# Patient Record
Sex: Female | Born: 1945
Health system: Southern US, Community
[De-identification: ages and names within clinical notes are randomized; demographics above are authoritative.]

## PROBLEM LIST (undated history)

## (undated) DIAGNOSIS — G2581 Restless legs syndrome: Secondary | ICD-10-CM

## (undated) DIAGNOSIS — N809 Endometriosis, unspecified: Secondary | ICD-10-CM

## (undated) DIAGNOSIS — E119 Type 2 diabetes mellitus without complications: Secondary | ICD-10-CM

## (undated) DIAGNOSIS — I340 Nonrheumatic mitral (valve) insufficiency: Secondary | ICD-10-CM

## (undated) DIAGNOSIS — Z9289 Personal history of other medical treatment: Secondary | ICD-10-CM

## (undated) DIAGNOSIS — R7303 Prediabetes: Secondary | ICD-10-CM

## (undated) DIAGNOSIS — I471 Supraventricular tachycardia, unspecified: Secondary | ICD-10-CM

## (undated) DIAGNOSIS — I5189 Other ill-defined heart diseases: Secondary | ICD-10-CM

## (undated) DIAGNOSIS — I5032 Chronic diastolic (congestive) heart failure: Secondary | ICD-10-CM

## (undated) DIAGNOSIS — I2510 Atherosclerotic heart disease of native coronary artery without angina pectoris: Secondary | ICD-10-CM

## (undated) DIAGNOSIS — D649 Anemia, unspecified: Secondary | ICD-10-CM

## (undated) DIAGNOSIS — M545 Low back pain, unspecified: Secondary | ICD-10-CM

## (undated) DIAGNOSIS — K219 Gastro-esophageal reflux disease without esophagitis: Secondary | ICD-10-CM

## (undated) DIAGNOSIS — I4719 Other supraventricular tachycardia: Secondary | ICD-10-CM

## (undated) DIAGNOSIS — R079 Chest pain, unspecified: Secondary | ICD-10-CM

## (undated) DIAGNOSIS — M1990 Unspecified osteoarthritis, unspecified site: Secondary | ICD-10-CM

## (undated) DIAGNOSIS — I200 Unstable angina: Secondary | ICD-10-CM

## (undated) DIAGNOSIS — M5030 Other cervical disc degeneration, unspecified cervical region: Secondary | ICD-10-CM

## (undated) DIAGNOSIS — G8929 Other chronic pain: Secondary | ICD-10-CM

## (undated) DIAGNOSIS — E785 Hyperlipidemia, unspecified: Secondary | ICD-10-CM

## (undated) DIAGNOSIS — I071 Rheumatic tricuspid insufficiency: Secondary | ICD-10-CM

## (undated) DIAGNOSIS — I10 Essential (primary) hypertension: Secondary | ICD-10-CM

## (undated) DIAGNOSIS — I509 Heart failure, unspecified: Secondary | ICD-10-CM

## (undated) DIAGNOSIS — C539 Malignant neoplasm of cervix uteri, unspecified: Secondary | ICD-10-CM

## (undated) DIAGNOSIS — N329 Bladder disorder, unspecified: Secondary | ICD-10-CM

## (undated) DIAGNOSIS — T7840XA Allergy, unspecified, initial encounter: Secondary | ICD-10-CM

## (undated) DIAGNOSIS — Z636 Dependent relative needing care at home: Secondary | ICD-10-CM

## (undated) DIAGNOSIS — R32 Unspecified urinary incontinence: Secondary | ICD-10-CM

## (undated) DIAGNOSIS — C801 Malignant (primary) neoplasm, unspecified: Secondary | ICD-10-CM

## (undated) HISTORY — DX: Supraventricular tachycardia, unspecified: I47.10

## (undated) HISTORY — DX: Chronic diastolic (congestive) heart failure: I50.32

## (undated) HISTORY — PX: CARDIAC CATHETERIZATION: SHX172

## (undated) HISTORY — DX: Unspecified osteoarthritis, unspecified site: M19.90

## (undated) HISTORY — DX: Other ill-defined heart diseases: I51.89

## (undated) HISTORY — DX: Nonrheumatic mitral (valve) insufficiency: I34.0

## (undated) HISTORY — DX: Atherosclerotic heart disease of native coronary artery without angina pectoris: I25.10

## (undated) HISTORY — DX: Chest pain, unspecified: R07.9

## (undated) HISTORY — DX: Endometriosis, unspecified: N80.9

## (undated) HISTORY — DX: Anemia, unspecified: D64.9

## (undated) HISTORY — DX: Heart failure, unspecified: I50.9

## (undated) HISTORY — PX: OOPHORECTOMY: SHX86

## (undated) HISTORY — DX: Allergy, unspecified, initial encounter: T78.40XA

---

## 1974-06-23 HISTORY — PX: ABDOMINAL HYSTERECTOMY: SHX81

## 2005-10-10 ENCOUNTER — Ambulatory Visit: Payer: Self-pay | Admitting: Family Medicine

## 2007-11-17 ENCOUNTER — Ambulatory Visit: Payer: Self-pay | Admitting: Family Medicine

## 2009-12-12 ENCOUNTER — Ambulatory Visit: Payer: Self-pay | Admitting: Family Medicine

## 2010-06-25 ENCOUNTER — Inpatient Hospital Stay: Payer: Self-pay | Admitting: Internal Medicine

## 2010-07-02 ENCOUNTER — Ambulatory Visit: Payer: Self-pay | Admitting: Internal Medicine

## 2010-07-24 ENCOUNTER — Ambulatory Visit: Payer: Self-pay | Admitting: Internal Medicine

## 2010-08-22 ENCOUNTER — Ambulatory Visit: Payer: Self-pay | Admitting: Internal Medicine

## 2011-03-12 ENCOUNTER — Ambulatory Visit: Payer: Self-pay | Admitting: Family Medicine

## 2011-03-24 ENCOUNTER — Ambulatory Visit: Payer: Self-pay | Admitting: Family Medicine

## 2011-07-30 DIAGNOSIS — I10 Essential (primary) hypertension: Secondary | ICD-10-CM | POA: Diagnosis not present

## 2011-07-30 DIAGNOSIS — K219 Gastro-esophageal reflux disease without esophagitis: Secondary | ICD-10-CM | POA: Diagnosis not present

## 2011-07-30 DIAGNOSIS — G2581 Restless legs syndrome: Secondary | ICD-10-CM | POA: Diagnosis not present

## 2011-07-30 DIAGNOSIS — R32 Unspecified urinary incontinence: Secondary | ICD-10-CM | POA: Diagnosis not present

## 2011-08-05 DIAGNOSIS — G2581 Restless legs syndrome: Secondary | ICD-10-CM | POA: Diagnosis not present

## 2011-08-05 DIAGNOSIS — R7309 Other abnormal glucose: Secondary | ICD-10-CM | POA: Diagnosis not present

## 2011-08-05 DIAGNOSIS — I10 Essential (primary) hypertension: Secondary | ICD-10-CM | POA: Diagnosis not present

## 2011-08-05 DIAGNOSIS — D509 Iron deficiency anemia, unspecified: Secondary | ICD-10-CM | POA: Diagnosis not present

## 2011-11-19 ENCOUNTER — Ambulatory Visit: Payer: Self-pay | Admitting: Family Medicine

## 2011-11-19 DIAGNOSIS — R928 Other abnormal and inconclusive findings on diagnostic imaging of breast: Secondary | ICD-10-CM | POA: Diagnosis not present

## 2012-01-29 DIAGNOSIS — J069 Acute upper respiratory infection, unspecified: Secondary | ICD-10-CM | POA: Diagnosis not present

## 2012-02-27 DIAGNOSIS — R32 Unspecified urinary incontinence: Secondary | ICD-10-CM | POA: Diagnosis not present

## 2012-02-27 DIAGNOSIS — K219 Gastro-esophageal reflux disease without esophagitis: Secondary | ICD-10-CM | POA: Diagnosis not present

## 2012-02-27 DIAGNOSIS — E785 Hyperlipidemia, unspecified: Secondary | ICD-10-CM | POA: Diagnosis not present

## 2012-02-27 DIAGNOSIS — I10 Essential (primary) hypertension: Secondary | ICD-10-CM | POA: Diagnosis not present

## 2012-04-08 DIAGNOSIS — N3946 Mixed incontinence: Secondary | ICD-10-CM | POA: Diagnosis not present

## 2012-04-08 DIAGNOSIS — N952 Postmenopausal atrophic vaginitis: Secondary | ICD-10-CM | POA: Diagnosis not present

## 2012-04-08 DIAGNOSIS — N816 Rectocele: Secondary | ICD-10-CM | POA: Diagnosis not present

## 2012-04-16 DIAGNOSIS — R109 Unspecified abdominal pain: Secondary | ICD-10-CM | POA: Diagnosis not present

## 2012-04-16 DIAGNOSIS — R300 Dysuria: Secondary | ICD-10-CM | POA: Diagnosis not present

## 2012-07-12 DIAGNOSIS — Z23 Encounter for immunization: Secondary | ICD-10-CM | POA: Diagnosis not present

## 2012-08-03 DIAGNOSIS — J329 Chronic sinusitis, unspecified: Secondary | ICD-10-CM | POA: Diagnosis not present

## 2012-09-14 DIAGNOSIS — Z23 Encounter for immunization: Secondary | ICD-10-CM | POA: Diagnosis not present

## 2012-09-14 DIAGNOSIS — E785 Hyperlipidemia, unspecified: Secondary | ICD-10-CM | POA: Diagnosis not present

## 2012-09-14 DIAGNOSIS — K219 Gastro-esophageal reflux disease without esophagitis: Secondary | ICD-10-CM | POA: Diagnosis not present

## 2012-09-14 DIAGNOSIS — I10 Essential (primary) hypertension: Secondary | ICD-10-CM | POA: Diagnosis not present

## 2012-09-14 DIAGNOSIS — W1809XA Striking against other object with subsequent fall, initial encounter: Secondary | ICD-10-CM | POA: Diagnosis not present

## 2012-09-14 DIAGNOSIS — Z1159 Encounter for screening for other viral diseases: Secondary | ICD-10-CM | POA: Diagnosis not present

## 2012-09-14 DIAGNOSIS — E8881 Metabolic syndrome: Secondary | ICD-10-CM | POA: Diagnosis not present

## 2012-12-07 DIAGNOSIS — E785 Hyperlipidemia, unspecified: Secondary | ICD-10-CM | POA: Diagnosis not present

## 2012-12-07 DIAGNOSIS — I10 Essential (primary) hypertension: Secondary | ICD-10-CM | POA: Diagnosis not present

## 2012-12-07 DIAGNOSIS — G2581 Restless legs syndrome: Secondary | ICD-10-CM | POA: Diagnosis not present

## 2012-12-07 DIAGNOSIS — R32 Unspecified urinary incontinence: Secondary | ICD-10-CM | POA: Diagnosis not present

## 2013-01-11 ENCOUNTER — Ambulatory Visit: Payer: Self-pay | Admitting: Family Medicine

## 2013-01-11 DIAGNOSIS — R922 Inconclusive mammogram: Secondary | ICD-10-CM | POA: Diagnosis not present

## 2013-01-11 DIAGNOSIS — Z1231 Encounter for screening mammogram for malignant neoplasm of breast: Secondary | ICD-10-CM | POA: Diagnosis not present

## 2013-01-28 DIAGNOSIS — N3090 Cystitis, unspecified without hematuria: Secondary | ICD-10-CM | POA: Diagnosis not present

## 2013-02-10 ENCOUNTER — Ambulatory Visit: Payer: Self-pay | Admitting: Gastroenterology

## 2013-02-10 DIAGNOSIS — D649 Anemia, unspecified: Secondary | ICD-10-CM | POA: Diagnosis not present

## 2013-02-10 DIAGNOSIS — Z1211 Encounter for screening for malignant neoplasm of colon: Secondary | ICD-10-CM | POA: Diagnosis not present

## 2013-02-10 DIAGNOSIS — K219 Gastro-esophageal reflux disease without esophagitis: Secondary | ICD-10-CM | POA: Diagnosis not present

## 2013-02-10 DIAGNOSIS — I10 Essential (primary) hypertension: Secondary | ICD-10-CM | POA: Diagnosis not present

## 2013-02-10 DIAGNOSIS — Z79899 Other long term (current) drug therapy: Secondary | ICD-10-CM | POA: Diagnosis not present

## 2013-02-10 DIAGNOSIS — Z7982 Long term (current) use of aspirin: Secondary | ICD-10-CM | POA: Diagnosis not present

## 2013-02-10 DIAGNOSIS — E119 Type 2 diabetes mellitus without complications: Secondary | ICD-10-CM | POA: Diagnosis not present

## 2013-02-10 DIAGNOSIS — R011 Cardiac murmur, unspecified: Secondary | ICD-10-CM | POA: Diagnosis not present

## 2013-02-10 DIAGNOSIS — K5730 Diverticulosis of large intestine without perforation or abscess without bleeding: Secondary | ICD-10-CM | POA: Diagnosis not present

## 2013-03-15 DIAGNOSIS — E785 Hyperlipidemia, unspecified: Secondary | ICD-10-CM | POA: Diagnosis not present

## 2013-03-15 DIAGNOSIS — Z23 Encounter for immunization: Secondary | ICD-10-CM | POA: Diagnosis not present

## 2013-03-15 DIAGNOSIS — M25529 Pain in unspecified elbow: Secondary | ICD-10-CM | POA: Diagnosis not present

## 2013-03-15 DIAGNOSIS — I10 Essential (primary) hypertension: Secondary | ICD-10-CM | POA: Diagnosis not present

## 2013-03-15 DIAGNOSIS — E8881 Metabolic syndrome: Secondary | ICD-10-CM | POA: Diagnosis not present

## 2013-03-30 DIAGNOSIS — M7710 Lateral epicondylitis, unspecified elbow: Secondary | ICD-10-CM | POA: Diagnosis not present

## 2013-06-28 DIAGNOSIS — R1110 Vomiting, unspecified: Secondary | ICD-10-CM | POA: Diagnosis not present

## 2013-06-28 DIAGNOSIS — E8881 Metabolic syndrome: Secondary | ICD-10-CM | POA: Diagnosis not present

## 2013-06-28 DIAGNOSIS — K219 Gastro-esophageal reflux disease without esophagitis: Secondary | ICD-10-CM | POA: Diagnosis not present

## 2013-06-28 DIAGNOSIS — I10 Essential (primary) hypertension: Secondary | ICD-10-CM | POA: Diagnosis not present

## 2013-06-28 DIAGNOSIS — E785 Hyperlipidemia, unspecified: Secondary | ICD-10-CM | POA: Diagnosis not present

## 2013-06-28 DIAGNOSIS — R5381 Other malaise: Secondary | ICD-10-CM | POA: Diagnosis not present

## 2013-06-28 DIAGNOSIS — R32 Unspecified urinary incontinence: Secondary | ICD-10-CM | POA: Diagnosis not present

## 2013-06-28 DIAGNOSIS — M25529 Pain in unspecified elbow: Secondary | ICD-10-CM | POA: Diagnosis not present

## 2013-10-06 DIAGNOSIS — E785 Hyperlipidemia, unspecified: Secondary | ICD-10-CM | POA: Diagnosis not present

## 2013-10-06 DIAGNOSIS — G2581 Restless legs syndrome: Secondary | ICD-10-CM | POA: Diagnosis not present

## 2013-10-06 DIAGNOSIS — E8881 Metabolic syndrome: Secondary | ICD-10-CM | POA: Diagnosis not present

## 2013-10-06 DIAGNOSIS — I10 Essential (primary) hypertension: Secondary | ICD-10-CM | POA: Diagnosis not present

## 2014-02-07 DIAGNOSIS — E8881 Metabolic syndrome: Secondary | ICD-10-CM | POA: Diagnosis not present

## 2014-02-07 DIAGNOSIS — I10 Essential (primary) hypertension: Secondary | ICD-10-CM | POA: Diagnosis not present

## 2014-02-07 DIAGNOSIS — L723 Sebaceous cyst: Secondary | ICD-10-CM | POA: Diagnosis not present

## 2014-02-07 DIAGNOSIS — E785 Hyperlipidemia, unspecified: Secondary | ICD-10-CM | POA: Diagnosis not present

## 2014-03-06 DIAGNOSIS — J40 Bronchitis, not specified as acute or chronic: Secondary | ICD-10-CM | POA: Diagnosis not present

## 2014-06-19 DIAGNOSIS — K219 Gastro-esophageal reflux disease without esophagitis: Secondary | ICD-10-CM | POA: Diagnosis not present

## 2014-06-19 DIAGNOSIS — E785 Hyperlipidemia, unspecified: Secondary | ICD-10-CM | POA: Diagnosis not present

## 2014-06-19 DIAGNOSIS — G2581 Restless legs syndrome: Secondary | ICD-10-CM | POA: Diagnosis not present

## 2014-06-19 DIAGNOSIS — N952 Postmenopausal atrophic vaginitis: Secondary | ICD-10-CM | POA: Diagnosis not present

## 2014-06-19 DIAGNOSIS — Z23 Encounter for immunization: Secondary | ICD-10-CM | POA: Diagnosis not present

## 2014-06-19 DIAGNOSIS — E8881 Metabolic syndrome: Secondary | ICD-10-CM | POA: Diagnosis not present

## 2014-06-19 DIAGNOSIS — R32 Unspecified urinary incontinence: Secondary | ICD-10-CM | POA: Diagnosis not present

## 2014-06-19 DIAGNOSIS — I10 Essential (primary) hypertension: Secondary | ICD-10-CM | POA: Diagnosis not present

## 2014-06-19 DIAGNOSIS — E161 Other hypoglycemia: Secondary | ICD-10-CM | POA: Diagnosis not present

## 2014-06-19 LAB — HEMOGLOBIN A1C: HEMOGLOBIN A1C: 5.9 % (ref 4.0–6.0)

## 2014-06-20 DIAGNOSIS — I10 Essential (primary) hypertension: Secondary | ICD-10-CM | POA: Diagnosis not present

## 2014-06-20 DIAGNOSIS — E785 Hyperlipidemia, unspecified: Secondary | ICD-10-CM | POA: Diagnosis not present

## 2014-06-20 LAB — BASIC METABOLIC PANEL
BUN: 19 mg/dL (ref 4–21)
CREATININE: 1.2 mg/dL — AB (ref 0.5–1.1)
Glucose: 106 mg/dL
POTASSIUM: 4.6 mmol/L (ref 3.4–5.3)
SODIUM: 139 mmol/L (ref 137–147)

## 2014-06-20 LAB — HEPATIC FUNCTION PANEL
ALK PHOS: 63 U/L (ref 25–125)
ALT: 19 U/L (ref 7–35)
AST: 18 U/L (ref 13–35)
Bilirubin, Total: 0.4 mg/dL

## 2014-06-20 LAB — LIPID PANEL
Cholesterol: 208 mg/dL — AB (ref 0–200)
HDL: 83 mg/dL — AB (ref 35–70)
LDL Cholesterol: 104 mg/dL
LDL/HDL RATIO: 1.3
Triglycerides: 104 mg/dL (ref 40–160)

## 2014-07-31 DIAGNOSIS — L820 Inflamed seborrheic keratosis: Secondary | ICD-10-CM | POA: Diagnosis not present

## 2014-07-31 DIAGNOSIS — L720 Epidermal cyst: Secondary | ICD-10-CM | POA: Diagnosis not present

## 2014-08-22 DIAGNOSIS — E2839 Other primary ovarian failure: Secondary | ICD-10-CM | POA: Diagnosis not present

## 2014-08-22 DIAGNOSIS — Z1239 Encounter for other screening for malignant neoplasm of breast: Secondary | ICD-10-CM | POA: Diagnosis not present

## 2014-08-22 DIAGNOSIS — Z9181 History of falling: Secondary | ICD-10-CM | POA: Diagnosis not present

## 2014-08-22 DIAGNOSIS — Z719 Counseling, unspecified: Secondary | ICD-10-CM | POA: Diagnosis not present

## 2014-08-22 DIAGNOSIS — J40 Bronchitis, not specified as acute or chronic: Secondary | ICD-10-CM | POA: Diagnosis not present

## 2014-08-22 DIAGNOSIS — Z1389 Encounter for screening for other disorder: Secondary | ICD-10-CM | POA: Diagnosis not present

## 2014-08-22 DIAGNOSIS — Z0000 Encounter for general adult medical examination without abnormal findings: Secondary | ICD-10-CM | POA: Diagnosis not present

## 2014-08-22 DIAGNOSIS — Z124 Encounter for screening for malignant neoplasm of cervix: Secondary | ICD-10-CM | POA: Diagnosis not present

## 2014-08-22 DIAGNOSIS — Z1211 Encounter for screening for malignant neoplasm of colon: Secondary | ICD-10-CM | POA: Diagnosis not present

## 2014-08-22 DIAGNOSIS — Z1151 Encounter for screening for human papillomavirus (HPV): Secondary | ICD-10-CM | POA: Diagnosis not present

## 2014-08-22 LAB — HM PAP SMEAR

## 2014-09-04 ENCOUNTER — Ambulatory Visit: Payer: Self-pay | Admitting: Family Medicine

## 2014-09-04 DIAGNOSIS — J40 Bronchitis, not specified as acute or chronic: Secondary | ICD-10-CM | POA: Diagnosis not present

## 2014-09-04 DIAGNOSIS — R300 Dysuria: Secondary | ICD-10-CM | POA: Diagnosis not present

## 2014-09-04 DIAGNOSIS — R05 Cough: Secondary | ICD-10-CM | POA: Diagnosis not present

## 2014-09-12 DIAGNOSIS — L720 Epidermal cyst: Secondary | ICD-10-CM | POA: Diagnosis not present

## 2014-09-12 DIAGNOSIS — D485 Neoplasm of uncertain behavior of skin: Secondary | ICD-10-CM | POA: Diagnosis not present

## 2014-09-26 ENCOUNTER — Ambulatory Visit
Admit: 2014-09-26 | Disposition: A | Discharge status: Home / Self Care | Payer: Self-pay | Attending: Family Medicine | Admitting: Family Medicine

## 2014-09-26 DIAGNOSIS — E2839 Other primary ovarian failure: Secondary | ICD-10-CM | POA: Diagnosis not present

## 2014-09-26 DIAGNOSIS — Z1231 Encounter for screening mammogram for malignant neoplasm of breast: Secondary | ICD-10-CM | POA: Diagnosis not present

## 2014-09-26 DIAGNOSIS — M8589 Other specified disorders of bone density and structure, multiple sites: Secondary | ICD-10-CM | POA: Diagnosis not present

## 2014-09-26 DIAGNOSIS — M8580 Other specified disorders of bone density and structure, unspecified site: Secondary | ICD-10-CM | POA: Diagnosis not present

## 2014-11-20 ENCOUNTER — Ambulatory Visit
Admission: EM | Admit: 2014-11-20 | Discharge: 2014-11-20 | Disposition: A | Discharge status: Home / Self Care | Payer: Medicare Other | Attending: Family Medicine | Admitting: Family Medicine

## 2014-11-20 DIAGNOSIS — J301 Allergic rhinitis due to pollen: Secondary | ICD-10-CM

## 2014-11-20 DIAGNOSIS — H6593 Unspecified nonsuppurative otitis media, bilateral: Secondary | ICD-10-CM

## 2014-11-20 DIAGNOSIS — J0100 Acute maxillary sinusitis, unspecified: Secondary | ICD-10-CM | POA: Diagnosis not present

## 2014-11-20 HISTORY — DX: Bladder disorder, unspecified: N32.9

## 2014-11-20 HISTORY — DX: Gastro-esophageal reflux disease without esophagitis: K21.9

## 2014-11-20 HISTORY — DX: Essential (primary) hypertension: I10

## 2014-11-20 MED ORDER — ACETAMINOPHEN 500 MG PO TABS: 500.0000 mg | ORAL_TABLET | Freq: Four times a day (QID) | ORAL | Status: AC | PRN

## 2014-11-20 MED ORDER — SALINE SPRAY 0.65 % NA SOLN: 2.0000 | NASAL | Status: DC

## 2014-11-20 MED ORDER — FLUTICASONE PROPIONATE 50 MCG/ACT NA SUSP: 1.0000 | Freq: Two times a day (BID) | NASAL | Status: DC

## 2014-11-20 MED ORDER — KETOROLAC TROMETHAMINE 60 MG/2ML IM SOLN
30.0000 mg | Freq: Once | INTRAMUSCULAR | Status: AC
  Administered 2014-11-20: 30 mg via INTRAMUSCULAR

## 2014-11-20 NOTE — Discharge Instructions (Signed)
Otitis Media With Effusion Otitis media with effusion is the presence of fluid in the middle ear. This is a common problem in children, which often follows ear infections. It may be present for weeks or longer after the infection. Unlike an acute ear infection, otitis media with effusion refers only to fluid behind the ear drum and not infection. Children with repeated ear and sinus infections and allergy problems are the most likely to get otitis media with effusion. CAUSES  The most frequent cause of the fluid buildup is dysfunction of the eustachian tubes. These are the tubes that drain fluid in the ears to the back of the nose (nasopharynx). SYMPTOMS   The main symptom of this condition is hearing loss. As a result, you or your child may:  Listen to the TV at a loud volume.  Not respond to questions.  Ask "what" often when spoken to.  Mistake or confuse one sound or word for another.  There may be a sensation of fullness or pressure but usually not pain. DIAGNOSIS   Your health care provider will diagnose this condition by examining you or your child's ears.  Your health care provider may test the pressure in you or your child's ear with a tympanometer.  A hearing test may be conducted if the problem persists. TREATMENT   Treatment depends on the duration and the effects of the effusion.  Antibiotics, decongestants, nose drops, and cortisone-type drugs (tablets or nasal spray) may not be helpful.  Children with persistent ear effusions may have delayed language or behavioral problems. Children at risk for developmental delays in hearing, learning, and speech may require referral to a specialist earlier than children not at risk.  You or your child's health care provider may suggest a referral to an ear, nose, and throat surgeon for treatment. The following may help restore normal hearing:  Drainage of fluid.  Placement of ear tubes (tympanostomy tubes).  Removal of adenoids  (adenoidectomy). HOME CARE INSTRUCTIONS   Avoid secondhand smoke.  Infants who are breastfed are less likely to have this condition.  Avoid feeding infants while they are lying flat.  Avoid known environmental allergens.  Avoid people who are sick. SEEK MEDICAL CARE IF:   Hearing is not better in 3 months.  Hearing is worse.  Ear pain.  Drainage from the ear.  Dizziness. MAKE SURE YOU:   Understand these instructions.  Will watch your condition.  Will get help right away if you are not doing well or get worse. Document Released: 07/17/2004 Document Revised: 10/24/2013 Document Reviewed: 01/04/2013 [facility] Patient Information 2015 [place], [place]. This information is not intended to replace advice given to you by your health care provider. Make sure you discuss any questions you have with your health care provider. Sinusitis Sinusitis is redness, soreness, and inflammation of the paranasal sinuses. Paranasal sinuses are air pockets within the bones of your face (beneath the eyes, the middle of the forehead, or above the eyes). In healthy paranasal sinuses, mucus is able to drain out, and air is able to circulate through them by way of your nose. However, when your paranasal sinuses are inflamed, mucus and air can become trapped. This can allow bacteria and other germs to grow and cause infection. Sinusitis can develop quickly and last only a short time (acute) or continue over a long period (chronic). Sinusitis that lasts for more than 12 weeks is considered chronic.  CAUSES  Causes of sinusitis include:  Allergies.  Structural abnormalities, such as  displacement of the cartilage that separates your nostrils (deviated septum), which can decrease the air flow through your nose and sinuses and affect sinus drainage.  Functional abnormalities, such as when the small hairs (cilia) that line your sinuses and help remove mucus do not work properly or are not present. SIGNS AND  SYMPTOMS  Symptoms of acute and chronic sinusitis are the same. The primary symptoms are pain and pressure around the affected sinuses. Other symptoms include:  Upper toothache.  Earache.  Headache.  Bad breath.  Decreased sense of smell and taste.  A cough, which worsens when you are lying flat.  Fatigue.  Fever.  Thick drainage from your nose, which often is green and may contain pus (purulent).  Swelling and warmth over the affected sinuses. DIAGNOSIS  Your health care provider will perform a physical exam. During the exam, your health care provider may:  Look in your nose for signs of abnormal growths in your nostrils (nasal polyps).  Tap over the affected sinus to check for signs of infection.  View the inside of your sinuses (endoscopy) using an imaging device that has a light attached (endoscope). If your health care provider suspects that you have chronic sinusitis, one or more of the following tests may be recommended:  Allergy tests.  Nasal culture. A sample of mucus is taken from your nose, sent to a lab, and screened for bacteria.  Nasal cytology. A sample of mucus is taken from your nose and examined by your health care provider to determine if your sinusitis is related to an allergy. TREATMENT  Most cases of acute sinusitis are related to a viral infection and will resolve on their own within 10 days. Sometimes medicines are prescribed to help relieve symptoms (pain medicine, decongestants, nasal steroid sprays, or saline sprays).  However, for sinusitis related to a bacterial infection, your health care provider will prescribe antibiotic medicines. These are medicines that will help kill the bacteria causing the infection.  Rarely, sinusitis is caused by a fungal infection. In theses cases, your health care provider will prescribe antifungal medicine. For some cases of chronic sinusitis, surgery is needed. Generally, these are cases in which sinusitis recurs  more than 3 times per year, despite other treatments. HOME CARE INSTRUCTIONS   Drink plenty of water. Water helps thin the mucus so your sinuses can drain more easily.  Use a humidifier.  Inhale steam 3 to 4 times a day (for example, sit in the bathroom with the shower running).  Apply a warm, moist washcloth to your face 3 to 4 times a day, or as directed by your health care provider.  Use saline nasal sprays to help moisten and clean your sinuses.  Take medicines only as directed by your health care provider.  If you were prescribed either an antibiotic or antifungal medicine, finish it all even if you start to feel better. SEEK IMMEDIATE MEDICAL CARE IF:  You have increasing pain or severe headaches.  You have nausea, vomiting, or drowsiness.  You have swelling around your face.  You have vision problems.  You have a stiff neck.  You have difficulty breathing. MAKE SURE YOU:   Understand these instructions.  Will watch your condition.  Will get help right away if you are not doing well or get worse. Document Released: 06/09/2005 Document Revised: 10/24/2013 Document Reviewed: 06/24/2011 [facility] Patient Information 2015 [place], [place]. This information is not intended to replace advice given to you by your health care provider. Make sure  you discuss any questions you have with your health care provider. Allergic Rhinitis Allergic rhinitis is when the mucous membranes in the nose respond to allergens. Allergens are particles in the air that cause your body to have an allergic reaction. This causes you to release allergic antibodies. Through a chain of events, these eventually cause you to release histamine into the blood stream. Although meant to protect the body, it is this release of histamine that causes your discomfort, such as frequent sneezing, congestion, and an itchy, runny nose.  CAUSES  Seasonal allergic rhinitis (hay fever) is caused by pollen allergens that may  come from grasses, trees, and weeds. Year-round allergic rhinitis (perennial allergic rhinitis) is caused by allergens such as house dust mites, pet dander, and mold spores.  SYMPTOMS   Nasal stuffiness (congestion).  Itchy, runny nose with sneezing and tearing of the eyes. DIAGNOSIS  Your health care provider can help you determine the allergen or allergens that trigger your symptoms. If you and your health care provider are unable to determine the allergen, skin or blood testing may be used. TREATMENT  Allergic rhinitis does not have a cure, but it can be controlled by:  Medicines and allergy shots (immunotherapy).  Avoiding the allergen. Hay fever may often be treated with antihistamines in pill or nasal spray forms. Antihistamines block the effects of histamine. There are over-the-counter medicines that may help with nasal congestion and swelling around the eyes. Check with your health care provider before taking or giving this medicine.  If avoiding the allergen or the medicine prescribed do not work, there are many new medicines your health care provider can prescribe. Stronger medicine may be used if initial measures are ineffective. Desensitizing injections can be used if medicine and avoidance does not work. Desensitization is when a patient is given ongoing shots until the body becomes less sensitive to the allergen. Make sure you follow up with your health care provider if problems continue. HOME CARE INSTRUCTIONS It is not possible to completely avoid allergens, but you can reduce your symptoms by taking steps to limit your exposure to them. It helps to know exactly what you are allergic to so that you can avoid your specific triggers. SEEK MEDICAL CARE IF:   You have a fever.  You develop a cough that does not stop easily (persistent).  You have shortness of breath.  You start wheezing.  Symptoms interfere with normal daily activities. Document Released: 03/04/2001 Document  Revised: 06/14/2013 Document Reviewed: 02/14/2013 [facility] Patient Information 2015 [place], [place]. This information is not intended to replace advice given to you by your health care provider. Make sure you discuss any questions you have with your health care provider.

## 2014-11-20 NOTE — ED Provider Notes (Signed)
CSN: [id number]     Arrival date & time 11/20/14  3614 History   First MD Initiated Contact with Patient 11/20/14 1112     Chief Complaint  Patient presents with  . Headache   (Consider location/radiation/quality/duration/timing/severity/associated sxs/prior Treatment) HPI Comments: Caucasian female has bilateral ear pressure and back of head pressure not improving with OTC ear gtts today, rest.  Has tried everything OTC for her allergies without any relief po and nose sprays.    Patient is a 69 y.o. female presenting with headaches. The history is provided by the patient.  Headache Pain location:  Occipital Quality:  Dull Radiates to:  Does not radiate Severity currently:  4/10 Severity at highest:  4/10 Onset quality:  Gradual Timing:  Constant Progression:  Unchanged Chronicity:  New Similar to prior headaches: no   Context: not activity, not exposure to bright light, not caffeine, not coughing, not defecating, not eating, not stress, not exposure to cold air, not intercourse, not loud noise and not straining   Relieved by:  Nothing Worsened by:  Activity Ineffective treatments: OTC ear drops. Associated symptoms: congestion   Associated symptoms: no abdominal pain, no back pain, no blurred vision, no cough, no diarrhea, no dizziness, no drainage, no ear pain, no eye pain, no facial pain, no fatigue, no fever, no focal weakness, no hearing loss, no loss of balance, no myalgias, no nausea, no near-syncope, no neck pain, no neck stiffness, no numbness, no paresthesias, no photophobia, no seizures, no sinus pressure, no sore throat, no swollen glands, no syncope, no tingling, no URI, no visual change, no vomiting and no weakness   Associated symptoms comment:  Ear pressure bilaterally; occipital head pressure Risk factors: no anger, no family hx of SAH, does not have insomnia and lifestyle not sedentary     Past Medical History  Diagnosis Date  . Hypertension   . GERD  (gastroesophageal reflux disease)   . Bladder disorder    Past Surgical History  Procedure Laterality Date  . Abdominal hysterectomy     Family History  Problem Relation Age of Onset  . Heart failure Father    History  Substance Use Topics  . Smoking status: Former Research scientist (life sciences)  . Smokeless tobacco: Not on file  . Alcohol Use: Yes     Comment: socially   OB History    No data available     Review of Systems  Constitutional: Negative for fever, diaphoresis, activity change and fatigue.  HENT: Positive for congestion and rhinorrhea. Negative for dental problem, drooling, ear discharge, ear pain, facial swelling, hearing loss, mouth sores, nosebleeds, postnasal drip, sinus pressure, sneezing, sore throat, tinnitus, trouble swallowing and voice change.   Eyes: Negative for blurred vision, photophobia, pain, discharge, redness, itching and visual disturbance.  Respiratory: Negative for apnea, cough, choking, chest tightness, shortness of breath, wheezing and stridor.   Cardiovascular: Negative for chest pain, palpitations, leg swelling, syncope and near-syncope.  Gastrointestinal: Negative for nausea, vomiting, abdominal pain, diarrhea, constipation and blood in stool.  Endocrine: Negative for cold intolerance and heat intolerance.  Genitourinary: Negative for dysuria, hematuria and difficulty urinating.  Musculoskeletal: Negative for myalgias, back pain, joint swelling, arthralgias, gait problem, neck pain and neck stiffness.  Skin: Negative for color change, pallor, rash and wound.  Allergic/Immunologic: Positive for environmental allergies. Negative for food allergies.  Neurological: Positive for headaches. Negative for dizziness, tremors, focal weakness, seizures, syncope, facial asymmetry, speech difficulty, weakness, light-headedness, numbness, paresthesias and loss of balance.  Hematological: Negative for  adenopathy. Does not bruise/bleed easily.  Psychiatric/Behavioral: Negative for  behavioral problems, confusion, sleep disturbance and agitation.    Allergies  Review of patient's allergies indicates no known allergies.  Home Medications   Prior to Admission medications   Medication Sig Start Date End Date Taking? Authorizing Provider  ferrous sulfate 325 (65 FE) MG tablet Take 325 mg by mouth daily with breakfast.   Yes Historical Provider, MD  lisinopril (PRINIVIL,ZESTRIL) 20 MG tablet Take 20 mg by mouth daily.   Yes Historical Provider, MD  Multiple Vitamin (MULTIVITAMIN) capsule Take 1 capsule by mouth daily.   Yes Historical Provider, MD  omeprazole (PRILOSEC) 40 MG capsule Take 40 mg by mouth daily.   Yes Historical Provider, MD  oxybutynin (DITROPAN-XL) 5 MG 24 hr tablet Take 5 mg by mouth at bedtime.   Yes Historical Provider, MD  acetaminophen (TYLENOL) 500 MG tablet Take 1 tablet (500 mg total) by mouth every 6 (six) hours as needed for headache. 11/20/14   Olen Cordial, NP  fluticasone (FLONASE) 50 MCG/ACT nasal spray Place 1 spray into both nostrils 2 (two) times daily. 11/20/14   Olen Cordial, NP  sodium chloride (OCEAN) 0.65 % SOLN nasal spray Place 2 sprays into both nostrils every 2 (two) hours while awake. 11/20/14   Aura Fey Jaylynn Siefert, NP   BP 154/79 mmHg  Pulse 72  Temp(Src) 97.9 F (36.6 C) (Tympanic)  Resp 16  Ht 5\' 4"  (1.626 m)  Wt 165 lb (74.844 kg)  BMI 28.31 kg/m2  SpO2 98% Physical Exam  Constitutional: She is oriented to person, place, and time. Vital signs are normal. She appears well-developed and well-nourished. No distress.  HENT:  Head: Normocephalic and atraumatic.  Right Ear: External ear normal. A middle ear effusion is present.  Left Ear: External ear normal. A middle ear effusion is present.  Nose: Mucosal edema, rhinorrhea and sinus tenderness present. No nose lacerations, nasal deformity, septal deviation or nasal septal hematoma. No epistaxis.  No foreign bodies. Right sinus exhibits maxillary sinus tenderness.  Right sinus exhibits no frontal sinus tenderness. Left sinus exhibits maxillary sinus tenderness. Left sinus exhibits no frontal sinus tenderness.  Mouth/Throat: Oropharynx is clear and moist. No oropharyngeal exudate.  Bilateral TMs with air fluid level clear; slight pressure with bilateral maxillary palpation  Eyes: Conjunctivae, EOM and lids are normal. Pupils are equal, round, and reactive to light. Right eye exhibits no discharge. Left eye exhibits no discharge. No scleral icterus.  Neck: Trachea normal and normal range of motion. Neck supple. No JVD present. No tracheal deviation present. No thyromegaly present.  Cardiovascular: Normal rate, regular rhythm, normal heart sounds and intact distal pulses.  Exam reveals no gallop and no friction rub.   No murmur heard. Pulmonary/Chest: Effort normal and breath sounds normal. No respiratory distress. She has no wheezes. She has no rales. She exhibits no tenderness.  Abdominal: Soft. Bowel sounds are normal. She exhibits no shifting dullness, no distension, no pulsatile liver, no fluid wave, no abdominal bruit, no ascites, no pulsatile midline mass and no mass. There is no hepatosplenomegaly. There is no tenderness. There is no rebound, no guarding and no CVA tenderness.  Dull to percussion x 4 quads  Musculoskeletal: Normal range of motion. She exhibits no edema or tenderness.  Lymphadenopathy:    She has no cervical adenopathy.  Neurological: She is alert and oriented to person, place, and time.  Skin: Skin is warm, dry and intact. No rash noted. She is not diaphoretic.  No erythema. No pallor.  Psychiatric: She has a normal mood and affect. Her speech is normal and behavior is normal. Judgment and thought content normal. Cognition and memory are normal.  Nursing note and vitals reviewed.   ED Course  Procedures (including critical care time) Labs Review Labs Reviewed - No data to display  Imaging Review No results found.  Patient  reported headache improved after toradol.  Blood pressure stable patient denied dizzyness/weakness.  Ambulatory gait steady in exam room with RN for discharge teaching.   MDM   1. Allergic rhinitis due to pollen   2. Acute maxillary sinusitis, recurrence not specified   3. Otitis media with effusion, bilateral    No evidence of bacterial infection, non toxic and well hydrated.  I do not see where any further testing or imaging is necessary at this time.   I will suggest supportive care, rest, good hygiene and encourage the patient to take adequate fluids.  The patient is to return to clinic or EMERGENCY ROOM if symptoms worsen or change significantly.  Exitcare handout on sinusitis given to patient.  Patient verbalized agreement and understanding of treatment plan and had no further questions at this time.   P2:  Hand washing and cover cough Patient may use normal saline nasal spray as needed.  Consider antihistamine or nasal steroid use.  Avoid triggers if possible.  Shower prior to bedtime if exposed to triggers.  If allergic dust/dust mites recommend mattress/pillow covers/encasements; washing linens, vacuuming, sweeping, dusting weekly.  Call or return to clinic as needed if these symptoms worsen or fail to improve as anticipated.   Exitcare handout on allergic rhinitis given to patient.  Patient verbalized understanding of instructions, agreed with plan of care and had no further questions at this time.  P2:  Avoidance and hand washing. Supportive treatment.   No evidence of invasive bacterial infection, non toxic and well hydrated.  This is most likely self limiting viral infection.  I do not see where any further testing or imaging is necessary at this time.   I will suggest supportive care, rest, good hygiene and encourage the patient to take adequate fluids.  The patient is to return to clinic or EMERGENCY ROOM if symptoms worsen or change significantly e.g. ear pain, fever, purulent discharge  from ears or bleeding.  Exitcare handout on otitis media with effusion given to patient.  Patient verbalized agreement and understanding of treatment plan.   For acute pain, rest, and intermittent application of heat, analgesics, and PRN po NSAIDS.  Avoid known triggers e.g. sleep deprivation, foods, stress, dehydration.  If headache is the worst headache of entire life and came on like a clap of thunder patient was instructed to go to the Emergency Room.  Call or return to clinic as needed if these symptoms worsen or fail to improve as anticipated.  Exitcare handout on headaches given to patient and patient also instructed to maintain headache log.  Patient verbalized agreement and understanding of treatment plan. P2:  Diet and fitness   Olen Cordial, NP 11/20/14 1523

## 2014-11-20 NOTE — ED Notes (Signed)
States started yesterday with "head pressure". "My ears feel sensitive to noise". Denies blurred vision, no other symptoms

## 2014-11-22 DIAGNOSIS — L820 Inflamed seborrheic keratosis: Secondary | ICD-10-CM | POA: Diagnosis not present

## 2014-11-22 DIAGNOSIS — L821 Other seborrheic keratosis: Secondary | ICD-10-CM | POA: Diagnosis not present

## 2014-11-22 DIAGNOSIS — L578 Other skin changes due to chronic exposure to nonionizing radiation: Secondary | ICD-10-CM | POA: Diagnosis not present

## 2014-12-17 ENCOUNTER — Encounter: Payer: Self-pay | Admitting: Family Medicine

## 2014-12-17 DIAGNOSIS — I10 Essential (primary) hypertension: Secondary | ICD-10-CM | POA: Insufficient documentation

## 2014-12-17 DIAGNOSIS — Z8541 Personal history of malignant neoplasm of cervix uteri: Secondary | ICD-10-CM | POA: Insufficient documentation

## 2014-12-17 DIAGNOSIS — R32 Unspecified urinary incontinence: Secondary | ICD-10-CM | POA: Insufficient documentation

## 2014-12-17 DIAGNOSIS — H269 Unspecified cataract: Secondary | ICD-10-CM | POA: Insufficient documentation

## 2014-12-17 DIAGNOSIS — E785 Hyperlipidemia, unspecified: Secondary | ICD-10-CM | POA: Insufficient documentation

## 2014-12-17 DIAGNOSIS — M8580 Other specified disorders of bone density and structure, unspecified site: Secondary | ICD-10-CM | POA: Insufficient documentation

## 2014-12-17 DIAGNOSIS — Z862 Personal history of diseases of the blood and blood-forming organs and certain disorders involving the immune mechanism: Secondary | ICD-10-CM | POA: Insufficient documentation

## 2014-12-17 DIAGNOSIS — I071 Rheumatic tricuspid insufficiency: Secondary | ICD-10-CM | POA: Insufficient documentation

## 2014-12-17 DIAGNOSIS — K219 Gastro-esophageal reflux disease without esophagitis: Secondary | ICD-10-CM | POA: Insufficient documentation

## 2014-12-17 DIAGNOSIS — E8881 Metabolic syndrome: Secondary | ICD-10-CM | POA: Insufficient documentation

## 2014-12-21 ENCOUNTER — Encounter: Payer: Self-pay | Admitting: Family Medicine

## 2014-12-21 ENCOUNTER — Encounter (INDEPENDENT_AMBULATORY_CARE_PROVIDER_SITE_OTHER): Payer: Self-pay

## 2014-12-21 ENCOUNTER — Ambulatory Visit (INDEPENDENT_AMBULATORY_CARE_PROVIDER_SITE_OTHER): Payer: Medicare Other | Admitting: Family Medicine

## 2014-12-21 VITALS — BP 118/76 | HR 95 | Temp 98.8°F | Resp 16 | Ht 64.0 in | Wt 164.2 lb

## 2014-12-21 DIAGNOSIS — R739 Hyperglycemia, unspecified: Secondary | ICD-10-CM | POA: Diagnosis not present

## 2014-12-21 DIAGNOSIS — Z23 Encounter for immunization: Secondary | ICD-10-CM

## 2014-12-21 DIAGNOSIS — M549 Dorsalgia, unspecified: Secondary | ICD-10-CM | POA: Diagnosis not present

## 2014-12-21 DIAGNOSIS — R32 Unspecified urinary incontinence: Secondary | ICD-10-CM | POA: Diagnosis not present

## 2014-12-21 DIAGNOSIS — G8929 Other chronic pain: Secondary | ICD-10-CM | POA: Diagnosis not present

## 2014-12-21 DIAGNOSIS — Z862 Personal history of diseases of the blood and blood-forming organs and certain disorders involving the immune mechanism: Secondary | ICD-10-CM | POA: Diagnosis not present

## 2014-12-21 DIAGNOSIS — R944 Abnormal results of kidney function studies: Secondary | ICD-10-CM

## 2014-12-21 DIAGNOSIS — E8881 Metabolic syndrome: Secondary | ICD-10-CM

## 2014-12-21 DIAGNOSIS — J309 Allergic rhinitis, unspecified: Secondary | ICD-10-CM | POA: Diagnosis not present

## 2014-12-21 DIAGNOSIS — Z90710 Acquired absence of both cervix and uterus: Secondary | ICD-10-CM | POA: Insufficient documentation

## 2014-12-21 DIAGNOSIS — E785 Hyperlipidemia, unspecified: Secondary | ICD-10-CM

## 2014-12-21 DIAGNOSIS — G2581 Restless legs syndrome: Secondary | ICD-10-CM | POA: Insufficient documentation

## 2014-12-21 DIAGNOSIS — I10 Essential (primary) hypertension: Secondary | ICD-10-CM | POA: Diagnosis not present

## 2014-12-21 DIAGNOSIS — L811 Chloasma: Secondary | ICD-10-CM | POA: Insufficient documentation

## 2014-12-21 MED ORDER — METFORMIN HCL 500 MG PO TABS: 500.0000 mg | ORAL_TABLET | Freq: Two times a day (BID) | ORAL | Status: DC

## 2014-12-21 MED ORDER — TRIAMTERENE-HCTZ 37.5-25 MG PO TABS: 1.0000 | ORAL_TABLET | Freq: Every day | ORAL | Status: DC

## 2014-12-21 MED ORDER — OXYBUTYNIN CHLORIDE 5 MG PO TABS: 5.0000 mg | ORAL_TABLET | Freq: Every day | ORAL | Status: DC

## 2014-12-21 MED ORDER — CARVEDILOL 6.25 MG PO TABS: 6.2500 mg | ORAL_TABLET | Freq: Two times a day (BID) | ORAL | Status: DC

## 2014-12-21 MED ORDER — FLUTICASONE PROPIONATE 50 MCG/ACT NA SUSP: 1.0000 | Freq: Two times a day (BID) | NASAL | Status: DC

## 2014-12-21 MED ORDER — TIZANIDINE HCL 2 MG PO TABS: 2.0000 mg | ORAL_TABLET | Freq: Every day | ORAL | Status: DC

## 2014-12-21 MED ORDER — ROPINIROLE HCL 2 MG PO TABS: 2.0000 mg | ORAL_TABLET | Freq: Every day | ORAL | Status: DC

## 2014-12-21 MED ORDER — LOSARTAN POTASSIUM 50 MG PO TABS: 50.0000 mg | ORAL_TABLET | Freq: Every day | ORAL | Status: DC

## 2014-12-21 NOTE — Progress Notes (Signed)
Name: Alyssa Lawson   MRN: [id number]    DOB: 1946-05-07   Date:12/21/2014       Progress Note  Subjective  Chief Complaint  Chief Complaint  Patient presents with  . Medication Refill    6 month F/U  . Hypertension    Checks at home- 126/70  . Sinusitis    Onset- 1 week, pressure in ears bilateral. stuffiness, nasal drainage,  uses Vick's Vapor Rub and using nasal sprays  . Hyperlipidemia    Muscle cramps    HPI  HTN: taking medications, no side effects and bp is under control.   Hyperlipidemia: last labs done in Feb 2016 and is at goal with Pravastatin. Denies side effects  GERD: she is taking Omeprazole prn only and symptoms have been controlled. Advised to try Ranitidine instead to avoid PPI use.   AR: she went to Urgent Care with sinus infection, she was given fluticasone, still using it, feels congested at night.  Advised loratadine otc to see if it will help with symptoms  Metabolic Syndrome: last MWUX3K in Feb was at goal, no polyphagia, polydipsia or polyuria.  Low GFR: last GFR was low, she has been taking Advil daily for low back pain and finger joint pains. Advised to switch to Tylenol max of 3 grams daily to avoid further drop of GFR  RLS: better with medication, sleeping well with medication, very seldom has symptoms.   Chronic Back Pain: she has daily low back pain, describes a dull pain , 3/10, no radiation. No weakens or tingling.  Patient Active Problem List   Diagnosis Date Noted  . Melasma 12/21/2014  . History of hysterectomy 12/21/2014  . Restless leg syndrome 12/21/2014  . Benign essential HTN 12/17/2014  . Cataract 12/17/2014  . Dyslipidemia 12/17/2014  . Gastric reflux 12/17/2014  . History of cervical cancer 12/17/2014  . H/O iron deficiency anemia 12/17/2014  . Urinary incontinence in female 12/17/2014  . Chronic back pain 12/17/2014  . Dysmetabolic syndrome 44/06/270  . TI (tricuspid incompetence) 12/17/2014  . Osteopenia of the  elderly 12/17/2014    Past Surgical History  Procedure Laterality Date  . Abdominal hysterectomy  1976  . Oophorectomy      Family History  Problem Relation Age of Onset  . Heart failure Father   . Arrhythmia Father   . Heart attack Mother   . Cardiomyopathy Sister   . Arrhythmia Sister   . Congestive Heart Failure Brother     History   Social History  . Marital Status: Married    Spouse Name: N/A  . Number of Children: N/A  . Years of Education: N/A   Occupational History  . Not on file.   Social History Main Topics  . Smoking status: Former Smoker -- 1.00 packs/day for 40 years    Types: Cigarettes    Start date: 06/23/1956    Quit date: 06/23/1996  . Smokeless tobacco: Never Used  . Alcohol Use: 0.0 oz/week    0 Standard drinks or equivalent per week     Comment: socially  . Drug Use: No  . Sexual Activity:    Partners: Male   Other Topics Concern  . Not on file   Social History Narrative     Current outpatient prescriptions:  .  acetaminophen (TYLENOL) 500 MG tablet, Take 1 tablet (500 mg total) by mouth every 6 (six) hours as needed for headache., Disp: 30 tablet, Rfl: 0 .  aspirin 81 MG chewable tablet, Chew  1 tablet by mouth daily., Disp: , Rfl:  .  carvedilol (COREG) 6.25 MG tablet, Take 1 tablet (6.25 mg total) by mouth 2 (two) times daily., Disp: 180 tablet, Rfl: 1 .  ferrous sulfate 325 (65 FE) MG tablet, Take 325 mg by mouth daily with breakfast., Disp: , Rfl:  .  fluticasone (FLONASE) 50 MCG/ACT nasal spray, Place 1 spray into both nostrils 2 (two) times daily., Disp: 48 g, Rfl: 0 .  losartan (COZAAR) 50 MG tablet, Take 1 tablet (50 mg total) by mouth daily., Disp: 90 tablet, Rfl: 1 .  Magnesium Oxide 400 (240 MG) MG TABS, Take 1 tablet by mouth daily., Disp: , Rfl:  .  metFORMIN (GLUCOPHAGE) 500 MG tablet, Take 1 tablet (500 mg total) by mouth 2 (two) times daily., Disp: 90 tablet, Rfl: 1 .  Multiple Vitamin (MULTIVITAMIN) capsule, Take 1  capsule by mouth daily., Disp: , Rfl:  .  omeprazole (PRILOSEC) 40 MG capsule, Take 40 mg by mouth daily., Disp: , Rfl:  .  oxybutynin (DITROPAN) 5 MG tablet, Take 1 tablet (5 mg total) by mouth daily., Disp: 90 tablet, Rfl: 1 .  pravastatin (PRAVACHOL) 40 MG tablet, Take 1 tablet by mouth daily., Disp: , Rfl:  .  rOPINIRole (REQUIP) 2 MG tablet, Take 1 tablet (2 mg total) by mouth at bedtime., Disp: 90 tablet, Rfl: 1 .  triamterene-hydrochlorothiazide (MAXZIDE-25) 37.5-25 MG per tablet, Take 1 tablet by mouth daily., Disp: 90 tablet, Rfl: 1 .  valACYclovir (VALTREX) 500 MG tablet, Take 1 tablet by mouth 2 (two) times daily as needed., Disp: , Rfl:  .  sodium chloride (OCEAN) 0.65 % SOLN nasal spray, Place 2 sprays into both nostrils every 2 (two) hours while awake. (Patient not taking: Reported on 12/21/2014), Disp: , Rfl: 0 .  tiZANidine (ZANAFLEX) 2 MG tablet, Take 1 tablet (2 mg total) by mouth at bedtime., Disp: 90 tablet, Rfl: 1  No Known Allergies   ROS  Constitutional: Negative for fever or weight change.  Respiratory: Negative for cough and shortness of breath.   Cardiovascular: Negative for chest pain or palpitations.  Gastrointestinal: Negative for abdominal pain, no bowel changes.  Musculoskeletal: Negative for gait problem or joint swelling. Hands are stiff and low back pain Skin: Negative for rash.  Neurological: Negative for dizziness or headache.  No other specific complaints in a complete review of systems (except as listed in HPI above).  Objective  Filed Vitals:   12/21/14 0809  BP: 118/76  Pulse: 95  Temp: 98.8 F (37.1 C)  TempSrc: Oral  Resp: 16  Height: 5\' 4"  (1.626 m)  Weight: 164 lb 3.2 oz (74.481 kg)  SpO2: 96%    Body mass index is 28.17 kg/(m^2).  Physical Exam Constitutional: Patient appears well-developed and well-nourished. No distress.  HENT: Head: Normocephalic and atraumatic. Ears: B TMs ok, no erythema or effusion; Nose: Nose boggy  turbinates. Mouth/Throat: Oropharynx is clear and moist. No oropharyngeal exudate.  Eyes: Conjunctivae and EOM are normal. Pupils are equal, round, and reactive to light. No scleral icterus.  Neck: Normal range of motion. Neck supple. No JVD present. No thyromegaly present.  Cardiovascular: Normal rate, regular rhythm , Systolic and diastolic murmur most audible on 2nd left intercostal space - no radiation, 2/6. No BLE edema. Pulmonary/Chest: Effort normal and breath sounds normal. No respiratory distress. Abdominal: Soft. Bowel sounds are normal, no distension. There is no tenderness. no masses Musculoskeletal: Normal range of motion, no joint effusions. No gross deformities  Pain during palpation of lumbar spine Neurological: he is alert and oriented to person, place, and time. No cranial nerve deficit. Coordination, balance, strength, speech and gait are normal.  Skin: Skin is warm and dry. No rash noted. No erythema.  Psychiatric: Patient has a normal mood and affect. behavior is normal. Judgment and thought content normal.  PHQ2/9: Depression screen PHQ 2/9 12/21/2014  Decreased Interest 0  Down, Depressed, Hopeless 0  PHQ - 2 Score 0     Fall Risk: Fall Risk  12/21/2014  Falls in the past year? No    Assessment & Plan  1. Dyslipidemia Continue  medication   2. Benign essential HTN At goal - Comprehensive Metabolic Panel (CMET) - triamterene-hydrochlorothiazide (MAXZIDE-25) 37.5-25 MG per tablet; Take 1 tablet by mouth daily.  Dispense: 90 tablet; Refill: 1 - losartan (COZAAR) 50 MG tablet; Take 1 tablet (50 mg total) by mouth daily.  Dispense: 90 tablet; Refill: 1 - carvedilol (COREG) 6.25 MG tablet; Take 1 tablet (6.25 mg total) by mouth 2 (two) times daily.  Dispense: 180 tablet; Refill: 1  3. H/O iron deficiency anemia Recheck labs - CBC with Differential  4. Dysmetabolic syndrome  - HgB O7M - metFORMIN (GLUCOPHAGE) 500 MG tablet; Take 1 tablet (500 mg total) by mouth  2 (two) times daily.  Dispense: 90 tablet; Refill: 1  5. Allergic rhinitis, unspecified allergic rhinitis type Add loratadine otc to see if help with symptoms - fluticasone (FLONASE) 50 MCG/ACT nasal spray; Place 1 spray into both nostrils 2 (two) times daily.  Dispense: 48 g; Refill: 0  6. Kidney function test abnormal Recheck labs, explained importance of stopping NSAID's, and take Tylenol   7. Urinary incontinence in female  - oxybutynin (DITROPAN) 5 MG tablet; Take 1 tablet (5 mg total) by mouth daily.  Dispense: 90 tablet; Refill: 1  8. Restless leg syndrome  - rOPINIRole (REQUIP) 2 MG tablet; Take 1 tablet (2 mg total) by mouth at bedtime.  Dispense: 90 tablet; Refill: 1  9. Hyperglycemia  - HgB A1c  10. Need for pneumococcal vaccination  - Pneumococcal conjugate vaccine 13-valent  11. Chronic back pain Start Tizanidine 2mg  qhs,  Stop Nsaid's Take Tylenol max of 3 grams daily

## 2014-12-22 LAB — CBC WITH DIFFERENTIAL/PLATELET
Basophils Absolute: 0 10*3/uL (ref 0.0–0.2)
Basos: 1 %
EOS (ABSOLUTE): 0.4 10*3/uL (ref 0.0–0.4)
EOS: 6 %
Hematocrit: 46.2 % (ref 34.0–46.6)
Hemoglobin: 15.8 g/dL (ref 11.1–15.9)
IMMATURE GRANS (ABS): 0 10*3/uL (ref 0.0–0.1)
IMMATURE GRANULOCYTES: 0 %
LYMPHS: 18 %
Lymphocytes Absolute: 1.4 10*3/uL (ref 0.7–3.1)
MCH: 31.1 pg (ref 26.6–33.0)
MCHC: 34.2 g/dL (ref 31.5–35.7)
MCV: 91 fL (ref 79–97)
MONOCYTES: 8 %
Monocytes Absolute: 0.6 10*3/uL (ref 0.1–0.9)
Neutrophils Absolute: 5.1 10*3/uL (ref 1.4–7.0)
Neutrophils: 67 %
Platelets: 274 10*3/uL (ref 150–379)
RBC: 5.08 x10E6/uL (ref 3.77–5.28)
RDW: 12.8 % (ref 12.3–15.4)
WBC: 7.5 10*3/uL (ref 3.4–10.8)

## 2014-12-22 LAB — COMPREHENSIVE METABOLIC PANEL
A/G RATIO: 1.6 (ref 1.1–2.5)
ALBUMIN: 4.7 g/dL (ref 3.6–4.8)
ALT: 21 IU/L (ref 0–32)
AST: 22 IU/L (ref 0–40)
Alkaline Phosphatase: 71 IU/L (ref 39–117)
BILIRUBIN TOTAL: 0.4 mg/dL (ref 0.0–1.2)
BUN / CREAT RATIO: 21 (ref 11–26)
BUN: 19 mg/dL (ref 8–27)
CALCIUM: 10.5 mg/dL — AB (ref 8.7–10.3)
CO2: 26 mmol/L (ref 18–29)
CREATININE: 0.92 mg/dL (ref 0.57–1.00)
Chloride: 95 mmol/L — ABNORMAL LOW (ref 97–108)
GFR, EST AFRICAN AMERICAN: 74 mL/min/{1.73_m2} (ref >59)
GFR, EST NON AFRICAN AMERICAN: 64 mL/min/{1.73_m2} (ref >59)
GLUCOSE: 104 mg/dL — AB (ref 65–99)
Globulin, Total: 3 g/dL (ref 1.5–4.5)
Potassium: 4.5 mmol/L (ref 3.5–5.2)
Sodium: 139 mmol/L (ref 134–144)
TOTAL PROTEIN: 7.7 g/dL (ref 6.0–8.5)

## 2014-12-22 LAB — HEMOGLOBIN A1C
ESTIMATED AVERAGE GLUCOSE: 137 mg/dL
HEMOGLOBIN A1C: 6.4 % — AB (ref 4.8–5.6)

## 2015-03-12 ENCOUNTER — Ambulatory Visit
Admission: EM | Admit: 2015-03-12 | Discharge: 2015-03-12 | Disposition: A | Discharge status: Home / Self Care | Payer: Medicare Other | Attending: Family Medicine | Admitting: Family Medicine

## 2015-03-12 ENCOUNTER — Encounter: Payer: Self-pay | Admitting: Emergency Medicine

## 2015-03-12 DIAGNOSIS — Z79899 Other long term (current) drug therapy: Secondary | ICD-10-CM | POA: Diagnosis not present

## 2015-03-12 DIAGNOSIS — M67441 Ganglion, right hand: Secondary | ICD-10-CM | POA: Diagnosis not present

## 2015-03-12 DIAGNOSIS — Z7982 Long term (current) use of aspirin: Secondary | ICD-10-CM | POA: Diagnosis not present

## 2015-03-12 DIAGNOSIS — K219 Gastro-esophageal reflux disease without esophagitis: Secondary | ICD-10-CM | POA: Insufficient documentation

## 2015-03-12 DIAGNOSIS — N390 Urinary tract infection, site not specified: Secondary | ICD-10-CM | POA: Diagnosis not present

## 2015-03-12 DIAGNOSIS — I10 Essential (primary) hypertension: Secondary | ICD-10-CM | POA: Insufficient documentation

## 2015-03-12 DIAGNOSIS — M7989 Other specified soft tissue disorders: Secondary | ICD-10-CM | POA: Diagnosis present

## 2015-03-12 DIAGNOSIS — L03119 Cellulitis of unspecified part of limb: Secondary | ICD-10-CM

## 2015-03-12 DIAGNOSIS — Z87891 Personal history of nicotine dependence: Secondary | ICD-10-CM | POA: Insufficient documentation

## 2015-03-12 DIAGNOSIS — L03113 Cellulitis of right upper limb: Secondary | ICD-10-CM | POA: Diagnosis not present

## 2015-03-12 LAB — URINALYSIS COMPLETE WITH MICROSCOPIC (ARMC ONLY)
Bilirubin Urine: NEGATIVE
Glucose, UA: NEGATIVE mg/dL
Hgb urine dipstick: NEGATIVE
Ketones, ur: NEGATIVE mg/dL
LEUKOCYTES UA: NEGATIVE
Nitrite: NEGATIVE
PH: 5.5 (ref 5.0–8.0)
PROTEIN: NEGATIVE mg/dL
RBC / HPF: NONE SEEN RBC/hpf (ref <3)
SPECIFIC GRAVITY, URINE: 1.025 (ref 1.005–1.030)

## 2015-03-12 MED ORDER — SULFAMETHOXAZOLE-TRIMETHOPRIM 800-160 MG PO TABS: 1.0000 | ORAL_TABLET | Freq: Two times a day (BID) | ORAL | Status: DC

## 2015-03-12 NOTE — ED Provider Notes (Signed)
CSN: [id number]     Arrival date & time 03/12/15  1507 History   First MD Initiated Contact with Patient 03/12/15 1554     Chief Complaint  Patient presents with  . Insect Bite   (Consider location/radiation/quality/duration/timing/severity/associated sxs/prior Treatment) HPI Comments: 69 yo female with a 3 days h/o of mild redness and swelling of the back of the right hand as well as a lump on the back of the hand. Denies any trauma/injuries, fevers, chills, blisters, itching, drainage, numbness/tingling or difficulty with movement.   Also complains of several days of "discomfort when urinating" and frequent urination.  Also complains of left ear fullness, discomfort. Has a h/o allergies.  The history is provided by the patient.    Past Medical History  Diagnosis Date  . Hypertension   . GERD (gastroesophageal reflux disease)   . Bladder disorder    Past Surgical History  Procedure Laterality Date  . Abdominal hysterectomy  1976  . Oophorectomy     Family History  Problem Relation Age of Onset  . Heart failure Father   . Arrhythmia Father   . Heart attack Mother   . Cardiomyopathy Sister   . Arrhythmia Sister   . Congestive Heart Failure Brother    Social History  Substance Use Topics  . Smoking status: Former Smoker -- 1.00 packs/day for 40 years    Types: Cigarettes    Start date: 06/23/1956    Quit date: 06/23/1996  . Smokeless tobacco: Never Used  . Alcohol Use: 0.0 oz/week    0 Standard drinks or equivalent per week     Comment: socially   OB History    No data available     Review of Systems  Allergies  Review of patient's allergies indicates no known allergies.  Home Medications   Prior to Admission medications   Medication Sig Start Date End Date Taking? Authorizing Provider  acetaminophen (TYLENOL) 500 MG tablet Take 1 tablet (500 mg total) by mouth every 6 (six) hours as needed for headache. 11/20/14   Olen Cordial, NP  aspirin 81 MG  chewable tablet Chew 1 tablet by mouth daily. 01/23/11   Historical Provider, MD  carvedilol (COREG) 6.25 MG tablet Take 1 tablet (6.25 mg total) by mouth 2 (two) times daily. 12/21/14   Steele Sizer, MD  ferrous sulfate 325 (65 FE) MG tablet Take 325 mg by mouth daily with breakfast.    Historical Provider, MD  fluticasone (FLONASE) 50 MCG/ACT nasal spray Place 1 spray into both nostrils 2 (two) times daily. 12/21/14   Steele Sizer, MD  losartan (COZAAR) 50 MG tablet Take 1 tablet (50 mg total) by mouth daily. 12/21/14   Steele Sizer, MD  Magnesium Oxide 400 (240 MG) MG TABS Take 1 tablet by mouth daily. 03/15/13   Historical Provider, MD  metFORMIN (GLUCOPHAGE) 500 MG tablet Take 1 tablet (500 mg total) by mouth 2 (two) times daily. 12/21/14   Steele Sizer, MD  Multiple Vitamin (MULTIVITAMIN) capsule Take 1 capsule by mouth daily.    Historical Provider, MD  omeprazole (PRILOSEC) 40 MG capsule Take 40 mg by mouth daily.    Historical Provider, MD  oxybutynin (DITROPAN) 5 MG tablet Take 1 tablet (5 mg total) by mouth daily. 12/21/14   Steele Sizer, MD  pravastatin (PRAVACHOL) 40 MG tablet Take 1 tablet by mouth daily. 06/19/14   Historical Provider, MD  rOPINIRole (REQUIP) 2 MG tablet Take 1 tablet (2 mg total) by mouth at bedtime. 12/21/14  Steele Sizer, MD  sodium chloride (OCEAN) 0.65 % SOLN nasal spray Place 2 sprays into both nostrils every 2 (two) hours while awake. Patient not taking: Reported on 12/21/2014 11/20/14   Olen Cordial, NP  sulfamethoxazole-trimethoprim (BACTRIM DS,SEPTRA DS) 800-160 MG per tablet Take 1 tablet by mouth 2 (two) times daily. 03/12/15   Norval Gable, MD  tiZANidine (ZANAFLEX) 2 MG tablet Take 1 tablet (2 mg total) by mouth at bedtime. 12/21/14   Steele Sizer, MD  triamterene-hydrochlorothiazide (MAXZIDE-25) 37.5-25 MG per tablet Take 1 tablet by mouth daily. 12/21/14   Steele Sizer, MD  valACYclovir (VALTREX) 500 MG tablet Take 1 tablet by mouth 2 (two)  times daily as needed. 02/27/12   Historical Provider, MD   Meds Ordered and Administered this Visit  Medications - No data to display  BP 146/83 mmHg  Pulse 74  Temp(Src) 97.8 F (36.6 C) (Tympanic)  Resp 16  Ht 5\' 4"  (1.626 m)  Wt 165 lb (74.844 kg)  BMI 28.31 kg/m2  SpO2 97% No data found.   Physical Exam  Constitutional: She appears well-developed and well-nourished. No distress.  HENT:  Head: Normocephalic.  Right Ear: Tympanic membrane and ear canal normal.  Left Ear: Tympanic membrane and ear canal normal.  Nose: Nose normal.  Mouth/Throat: Oropharynx is clear and moist. No oropharyngeal exudate.  Musculoskeletal:       Right hand: She exhibits tenderness (mild tenderness to palpation, warmth and mild erythema over the skin on the dorsum of the hand) and swelling (mild on dorsum of hand). She exhibits normal range of motion, no bony tenderness and no laceration. Normal strength noted.       Hands: Approximately 1.5cm soft tissue subcutaneous mass on the dorsum of the hand with mild tenderness to palpation  Skin: She is not diaphoretic.  Nursing note and vitals reviewed.   ED Course  Procedures (including critical care time)  Labs Review Labs Reviewed  URINALYSIS COMPLETEWITH MICROSCOPIC ([facility] ONLY) - Abnormal; Notable for the following:    APPearance HAZY (*)    Bacteria, UA MANY (*)    Squamous Epithelial / LPF 0-5 (*)    All other components within normal limits  URINE CULTURE    Imaging Review No results found.   Visual Acuity Review  Right Eye Distance:   Left Eye Distance:   Bilateral Distance:    Right Eye Near:   Left Eye Near:    Bilateral Near:         MDM   1. Cellulitis of hand   2. Ganglion cyst of flexor tendon sheath of finger of right hand   3. UTI (lower urinary tract infection)    (cellulitis mild)  Discharge Medication List as of 03/12/2015  4:59 PM    START taking these medications   Details    sulfamethoxazole-trimethoprim (BACTRIM DS,SEPTRA DS) 800-160 MG per tablet Take 1 tablet by mouth 2 (two) times daily., Starting 03/12/2015, Until Discontinued, Normal      Plan: 1.  diagnoses reviewed with patient 2. rx as per orders; risks, benefits, potential side effects reviewed with patient 3. Recommend supportive treatment with increased water intake; warm compresses to right hand 4. F/u prn if symptoms worsen or don't improve    Norval Gable, MD 03/12/15 1730

## 2015-03-12 NOTE — ED Notes (Signed)
Patient c/o possible insect bite, redness and swelling on top of her right hand since Saturday.  Patient c/o burning when urinating and increase in urinary frequency.

## 2015-03-14 ENCOUNTER — Other Ambulatory Visit: Payer: Self-pay | Admitting: Family Medicine

## 2015-03-14 DIAGNOSIS — J309 Allergic rhinitis, unspecified: Secondary | ICD-10-CM

## 2015-03-14 MED ORDER — FLUTICASONE PROPIONATE 50 MCG/ACT NA SUSP: 1.0000 | Freq: Two times a day (BID) | NASAL | Status: DC

## 2015-03-14 NOTE — Telephone Encounter (Signed)
Patient requesting refill. 

## 2015-03-15 LAB — URINE CULTURE: SPECIAL REQUESTS: NORMAL

## 2015-05-03 DIAGNOSIS — L821 Other seborrheic keratosis: Secondary | ICD-10-CM | POA: Diagnosis not present

## 2015-05-03 DIAGNOSIS — L814 Other melanin hyperpigmentation: Secondary | ICD-10-CM | POA: Diagnosis not present

## 2015-05-03 DIAGNOSIS — D485 Neoplasm of uncertain behavior of skin: Secondary | ICD-10-CM | POA: Diagnosis not present

## 2015-05-31 ENCOUNTER — Other Ambulatory Visit: Payer: Self-pay | Admitting: Family Medicine

## 2015-05-31 NOTE — Telephone Encounter (Signed)
Patient requesting refill. 

## 2015-06-22 ENCOUNTER — Encounter: Payer: Self-pay | Admitting: Family Medicine

## 2015-06-22 ENCOUNTER — Ambulatory Visit (INDEPENDENT_AMBULATORY_CARE_PROVIDER_SITE_OTHER): Payer: Medicare Other | Admitting: Family Medicine

## 2015-06-22 VITALS — BP 136/80 | HR 86 | Temp 98.2°F | Resp 16 | Ht 64.0 in | Wt 165.8 lb

## 2015-06-22 DIAGNOSIS — E785 Hyperlipidemia, unspecified: Secondary | ICD-10-CM

## 2015-06-22 DIAGNOSIS — Z79899 Other long term (current) drug therapy: Secondary | ICD-10-CM | POA: Diagnosis not present

## 2015-06-22 DIAGNOSIS — R32 Unspecified urinary incontinence: Secondary | ICD-10-CM | POA: Diagnosis not present

## 2015-06-22 DIAGNOSIS — G8929 Other chronic pain: Secondary | ICD-10-CM

## 2015-06-22 DIAGNOSIS — Z23 Encounter for immunization: Secondary | ICD-10-CM | POA: Diagnosis not present

## 2015-06-22 DIAGNOSIS — R739 Hyperglycemia, unspecified: Secondary | ICD-10-CM

## 2015-06-22 DIAGNOSIS — M549 Dorsalgia, unspecified: Secondary | ICD-10-CM | POA: Diagnosis not present

## 2015-06-22 DIAGNOSIS — R350 Frequency of micturition: Secondary | ICD-10-CM | POA: Diagnosis not present

## 2015-06-22 DIAGNOSIS — I10 Essential (primary) hypertension: Secondary | ICD-10-CM

## 2015-06-22 DIAGNOSIS — E8881 Metabolic syndrome: Secondary | ICD-10-CM

## 2015-06-22 DIAGNOSIS — I340 Nonrheumatic mitral (valve) insufficiency: Secondary | ICD-10-CM

## 2015-06-22 LAB — POCT URINALYSIS DIPSTICK
Bilirubin, UA: NEGATIVE
Blood, UA: NEGATIVE
Glucose, UA: NEGATIVE
Ketones, UA: NEGATIVE
NITRITE UA: NEGATIVE
PH UA: 7
PROTEIN UA: NEGATIVE
Spec Grav, UA: <=1.005
Urobilinogen, UA: 0.2

## 2015-06-22 MED ORDER — METFORMIN HCL 500 MG PO TABS: ORAL_TABLET | ORAL | Status: DC

## 2015-06-22 MED ORDER — CIPROFLOXACIN HCL 250 MG PO TABS: 250.0000 mg | ORAL_TABLET | Freq: Two times a day (BID) | ORAL | Status: DC

## 2015-06-22 NOTE — Patient Instructions (Signed)

## 2015-06-22 NOTE — Progress Notes (Signed)
Name: Alyssa Lawson   MRN: [id number]    DOB: August 31, 1945   Date:06/22/2015       Progress Note  Subjective  Chief Complaint  Chief Complaint  Patient presents with  . Medication Refill    6 month F/U  . Metabolic Syndrome  . Hypertension  . Urinary Incontinence  . Hyperlipidemia  . Urinary Frequency    Onset-Before Christmas, has been taking Tylenol. Also having burning.   Marland Kitchen Restless Syndrome    Improving  . Allergic Rhinitis     Flonase is helping due to nasal canals being dry  . Gastroesophageal Reflux    Improving but bleching occasionally    HPI  HTN: taking medications, no side effects and bp is under control, no chest pain, no palpitation .   Hyperlipidemia: last labs done one year ago  and is at goal with Pravastatin. Denies side effects. HDL is very good  GERD: she is taking Omeprazole prn only and symptoms have been controlled. Advised to try Ranitidine instead to avoid PPI use, she is currently trying to wean self off.    AR: symptoms are intermittent, using nasal steroid twice daily and is helping with nasal congestion and rhinorrhea.  Metabolic Syndrome: last Q000111Q was 6.4 % , but is down to 6.1% today, she denies polyphagia, polyuria or polydipsia  Urinary Frequency: she states she developed urinary frequency and dysuria on Christmas morning, she is taking otc Urostat and pain has improved, but she still has some frequency and would like to be treated for it today  RLS: better with medication, sleeping well with medication,- Requip -very seldom has symptoms.   Chronic Back Pain: she has daily low back pain, describes a dull pain , 2-3/10, no radiation. No weakens or tingling. She takes Tylenol prn and also Baclofen prn   Patient Active Problem List   Diagnosis Date Noted  . Metabolic syndrome [id number]  . Melasma 12/21/2014  . History of hysterectomy 12/21/2014  . Restless leg syndrome 12/21/2014  . Benign essential HTN 12/17/2014  . Cataract 12/17/2014   . Dyslipidemia 12/17/2014  . Gastric reflux 12/17/2014  . History of cervical cancer 12/17/2014  . H/O iron deficiency anemia 12/17/2014  . Urinary incontinence in female 12/17/2014  . Chronic back pain 12/17/2014  . Dysmetabolic syndrome [id number]  . TI (tricuspid incompetence) 12/17/2014  . Osteopenia of the elderly 12/17/2014    Past Surgical History  Procedure Laterality Date  . Abdominal hysterectomy  1976  . Oophorectomy      Family History  Problem Relation Age of Onset  . Heart failure Father   . Arrhythmia Father   . Heart attack Mother   . Cardiomyopathy Sister   . Arrhythmia Sister   . Congestive Heart Failure Brother     Social History   Social History  . Marital Status: Married    Spouse Name: N/A  . Number of Children: N/A  . Years of Education: N/A   Occupational History  . Not on file.   Social History Main Topics  . Smoking status: Former Smoker -- 1.00 packs/day for 40 years    Types: Cigarettes    Start date: 06/23/1956    Quit date: 06/23/1996  . Smokeless tobacco: Never Used  . Alcohol Use: 0.0 oz/week    0 Standard drinks or equivalent per week     Comment: socially  . Drug Use: No  . Sexual Activity:    Partners: Male   Other Topics Concern  .  Not on file   Social History Narrative     Current outpatient prescriptions:  .  acetaminophen (TYLENOL) 500 MG tablet, Take 1 tablet (500 mg total) by mouth every 6 (six) hours as needed for headache., Disp: 30 tablet, Rfl: 0 .  aspirin 81 MG chewable tablet, Chew 1 tablet by mouth daily., Disp: , Rfl:  .  carvedilol (COREG) 6.25 MG tablet, Take 1 tablet by mouth two  times daily, Disp: 180 tablet, Rfl: 1 .  ferrous sulfate 325 (65 FE) MG tablet, Take 325 mg by mouth daily with breakfast., Disp: , Rfl:  .  fluticasone (FLONASE) 50 MCG/ACT nasal spray, Place 1 spray into both nostrils 2 (two) times daily., Disp: 48 g, Rfl: 1 .  losartan (COZAAR) 50 MG tablet, Take 1 tablet by mouth   daily, Disp: 90 tablet, Rfl: 1 .  Magnesium Oxide 400 (240 MG) MG TABS, Take 1 tablet by mouth daily., Disp: , Rfl:  .  metFORMIN (GLUCOPHAGE) 500 MG tablet, Take 1 tablet by mouth two  times daily, Disp: 180 tablet, Rfl: 1 .  Multiple Vitamin (MULTIVITAMIN) capsule, Take 1 capsule by mouth daily., Disp: , Rfl:  .  omeprazole (PRILOSEC) 40 MG capsule, Take 40 mg by mouth daily., Disp: , Rfl:  .  oxybutynin (DITROPAN) 5 MG tablet, Take 1 tablet by mouth  daily, Disp: 90 tablet, Rfl: 1 .  pravastatin (PRAVACHOL) 40 MG tablet, Take 1 tablet by mouth  every morning, Disp: 90 tablet, Rfl: 1 .  rOPINIRole (REQUIP) 2 MG tablet, Take 1 tablet by mouth at  bedtime, Disp: 90 tablet, Rfl: 1 .  tiZANidine (ZANAFLEX) 2 MG tablet, Take 1 tablet by mouth at  bedtime, Disp: 90 tablet, Rfl: 1 .  triamterene-hydrochlorothiazide (MAXZIDE-25) 37.5-25 MG tablet, Take 1 tablet by mouth  daily, Disp: 90 tablet, Rfl: 1 .  valACYclovir (VALTREX) 500 MG tablet, Take 1 tablet by mouth 2 (two) times daily as needed., Disp: , Rfl:  .  ciprofloxacin (CIPRO) 250 MG tablet, Take 1 tablet (250 mg total) by mouth 2 (two) times daily., Disp: 6 tablet, Rfl: 0 .  sodium chloride (OCEAN) 0.65 % SOLN nasal spray, Place 2 sprays into both nostrils every 2 (two) hours while awake. (Patient not taking: Reported on 12/21/2014), Disp: , Rfl: 0  No Known Allergies   ROS  Constitutional: Negative for fever or weight change.  Respiratory: Negative for cough and shortness of breath.   Cardiovascular: Negative for chest pain or palpitations.  Gastrointestinal: Negative for abdominal pain, no bowel changes.  Musculoskeletal: Negative for gait problem or joint swelling.  Skin: Negative for rash.  Neurological: Negative for dizziness or headache.  No other specific complaints in a complete review of systems (except as listed in HPI above).  Objective  Filed Vitals:   06/22/15 1116  BP: 136/80  Pulse: 86  Temp: 98.2 F (36.8 C)   TempSrc: Oral  Resp: 16  Height: 5\' 4"  (1.626 m)  Weight: 165 lb 12.8 oz (75.206 kg)  SpO2: 96%    Body mass index is 28.45 kg/(m^2).  Physical Exam  Constitutional: Patient appears well-developed and well-nourished. Obese No distress.  HEENT: head atraumatic, normocephalic, pupils equal and reactive to light, ears TM,  neck supple, throat within normal limits Cardiovascular: Normal rate, regular rhythm , Systolic ejection murmur audible on both intercostal spaces.  No BLE edema. Pulmonary/Chest: Effort normal and breath sounds normal. No respiratory distress. Abdominal: Soft.  There is no tenderness. Psychiatric: Patient has  a normal mood and affect. behavior is normal. Judgment and thought content normal. Muscular Skeletal: normal low back exam today  Recent Results (from the past 2160 hour(s))  POCT Urinalysis Dipstick     Status: Abnormal   Collection Time: 06/22/15 11:34 AM  Result Value Ref Range   Color, UA yellow    Clarity, UA clear    Glucose, UA neg    Bilirubin, UA neg    Ketones, UA neg    Spec Grav, UA <=1.005    Blood, UA neg    pH, UA 7.0    Protein, UA neg    Urobilinogen, UA 0.2    Nitrite, UA neg    Leukocytes, UA moderate (2+) (A) Negative    PHQ2/9: Depression screen [facility] 2/9 06/22/2015 12/21/2014  Decreased Interest 0 0  Down, Depressed, Hopeless 0 0  PHQ - 2 Score 0 0    Fall Risk: Fall Risk  06/22/2015 12/21/2014  Falls in the past year? No No    Functional Status Survey: Is the patient deaf or have difficulty hearing?: No Does the patient have difficulty seeing, even when wearing glasses/contacts?: Yes (glasses) Does the patient have difficulty concentrating, remembering, or making decisions?: No Does the patient have difficulty walking or climbing stairs?: No Does the patient have difficulty dressing or bathing?: No Does the patient have difficulty doing errands alone such as visiting a doctor's office or shopping?: No    Assessment  & Plan  1. Dysmetabolic syndrome  - metFORMIN (GLUCOPHAGE) 500 MG tablet; Take 1 tablet by mouth two  times daily  Dispense: 180 tablet; Refill: 1  2. Urinary frequency  - POCT Urinalysis Dipstick - ciprofloxacin (CIPRO) 250 MG tablet; Take 1 tablet (250 mg total) by mouth 2 (two) times daily.  Dispense: 6 tablet; Refill: 0 - Urine culture  3. Needs flu shot  - Flu vaccine HIGH DOSE PF (Fluzone High dose)  4. Benign essential HTN  Doing well, continue medication   5. Urinary incontinence in female  Stable on medication   6. Hyperglycemia  - metFORMIN (GLUCOPHAGE) 500 MG tablet; Take 1 tablet by mouth two  times daily  Dispense: 180 tablet; Refill: 1  7. Dyslipidemia  Continue medication , no side effects  8. Chronic back pain  Continue medication and discuss home exercises  9. Hypercalcemia  Recheck level, she is off calcium supplementation, discussed importance of high calcium diet  10. Long-term use of high-risk medication  - Comprehensive metabolic panel

## 2015-06-23 LAB — COMPREHENSIVE METABOLIC PANEL
ALBUMIN: 4.6 g/dL (ref 3.6–4.8)
ALT: 22 IU/L (ref 0–32)
AST: 21 IU/L (ref 0–40)
Albumin/Globulin Ratio: 1.7 (ref 1.1–2.5)
Alkaline Phosphatase: 67 IU/L (ref 39–117)
BUN / CREAT RATIO: 19 (ref 11–26)
BUN: 19 mg/dL (ref 8–27)
Bilirubin Total: 0.4 mg/dL (ref 0.0–1.2)
CALCIUM: 10.4 mg/dL — AB (ref 8.7–10.3)
CO2: 25 mmol/L (ref 18–29)
CREATININE: 0.98 mg/dL (ref 0.57–1.00)
Chloride: 99 mmol/L (ref 96–106)
GFR, EST AFRICAN AMERICAN: 68 mL/min/{1.73_m2} (ref >59)
GFR, EST NON AFRICAN AMERICAN: 59 mL/min/{1.73_m2} — AB (ref >59)
GLOBULIN, TOTAL: 2.7 g/dL (ref 1.5–4.5)
Glucose: 112 mg/dL — ABNORMAL HIGH (ref 65–99)
Potassium: 4.5 mmol/L (ref 3.5–5.2)
SODIUM: 138 mmol/L (ref 134–144)
Total Protein: 7.3 g/dL (ref 6.0–8.5)

## 2015-06-23 LAB — LIPID PANEL
CHOLESTEROL TOTAL: 225 mg/dL — AB (ref 100–199)
Chol/HDL Ratio: 3 ratio units (ref 0.0–4.4)
HDL: 76 mg/dL (ref >39)
LDL Calculated: 123 mg/dL — ABNORMAL HIGH (ref 0–99)
Triglycerides: 128 mg/dL (ref 0–149)
VLDL Cholesterol Cal: 26 mg/dL (ref 5–40)

## 2015-06-24 LAB — PLEASE NOTE

## 2015-06-24 LAB — URINE CULTURE

## 2015-06-26 ENCOUNTER — Other Ambulatory Visit: Payer: Self-pay | Admitting: Family Medicine

## 2015-06-27 ENCOUNTER — Other Ambulatory Visit: Payer: Self-pay | Admitting: Family Medicine

## 2015-06-27 MED ORDER — ATORVASTATIN CALCIUM 40 MG PO TABS: 40.0000 mg | ORAL_TABLET | Freq: Every day | ORAL | Status: DC

## 2015-07-05 DIAGNOSIS — L821 Other seborrheic keratosis: Secondary | ICD-10-CM | POA: Diagnosis not present

## 2015-07-05 DIAGNOSIS — L812 Freckles: Secondary | ICD-10-CM | POA: Diagnosis not present

## 2015-07-13 ENCOUNTER — Other Ambulatory Visit: Payer: Self-pay | Admitting: Family Medicine

## 2015-07-15 LAB — PARATHYROID HORMONE, INTACT (NO CA): PTH: 35 pg/mL (ref 15–65)

## 2015-07-15 LAB — CALCIUM, IONIZED: Calcium, Ion: 5 mg/dL (ref 4.5–5.6)

## 2015-07-18 ENCOUNTER — Encounter: Payer: Self-pay | Admitting: Family Medicine

## 2015-08-15 ENCOUNTER — Other Ambulatory Visit: Payer: Self-pay | Admitting: Family Medicine

## 2015-08-15 NOTE — Telephone Encounter (Signed)
Patient requesting refill. 

## 2015-10-25 ENCOUNTER — Other Ambulatory Visit: Payer: Self-pay | Admitting: Family Medicine

## 2015-12-05 ENCOUNTER — Other Ambulatory Visit: Payer: Self-pay | Admitting: Family Medicine

## 2015-12-05 NOTE — Telephone Encounter (Signed)
Patient requesting refill. 

## 2015-12-31 ENCOUNTER — Ambulatory Visit: Payer: Medicare Other | Admitting: Family Medicine

## 2016-01-06 IMAGING — MG MM DIGITAL SCREENING BILAT W/ CAD
1 series · 6 of 6 positions shown · non-contrast
Comparison: Previous exam(s).

CLINICAL DATA: Screening.

EXAM:
DIGITAL SCREENING BILATERAL MAMMOGRAM WITH CAD

[R CC · right · 6 of 6 slices shown]
[im 1/6]
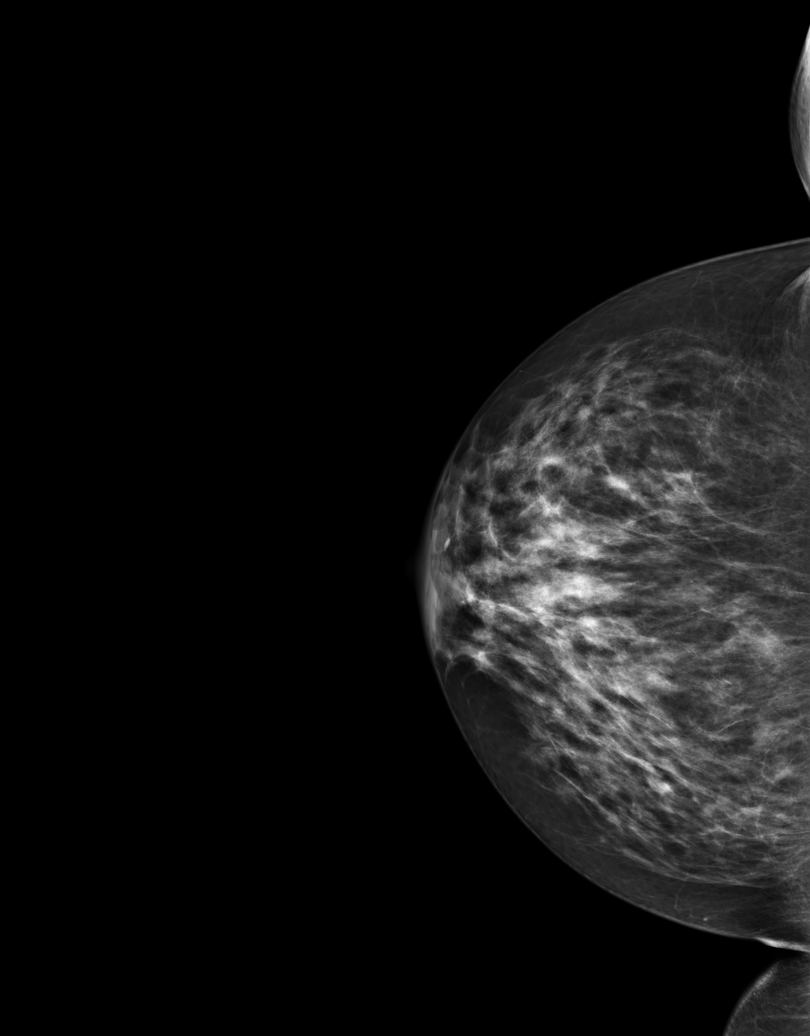
[im 2/6]
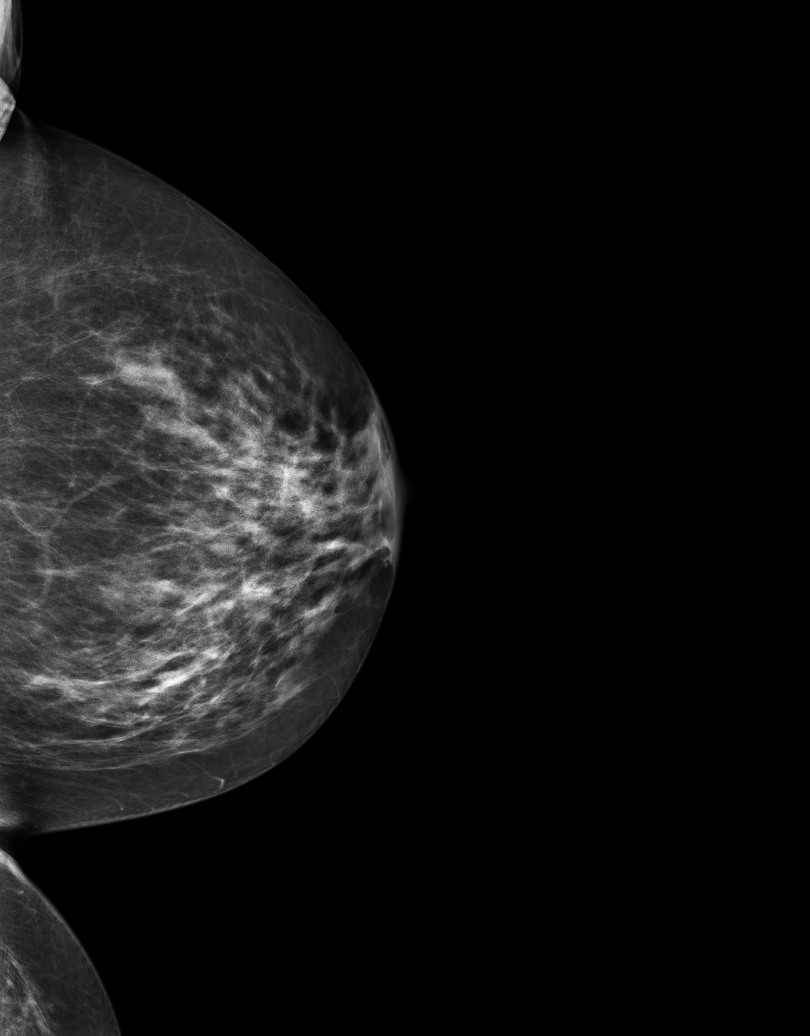
[im 3/6]
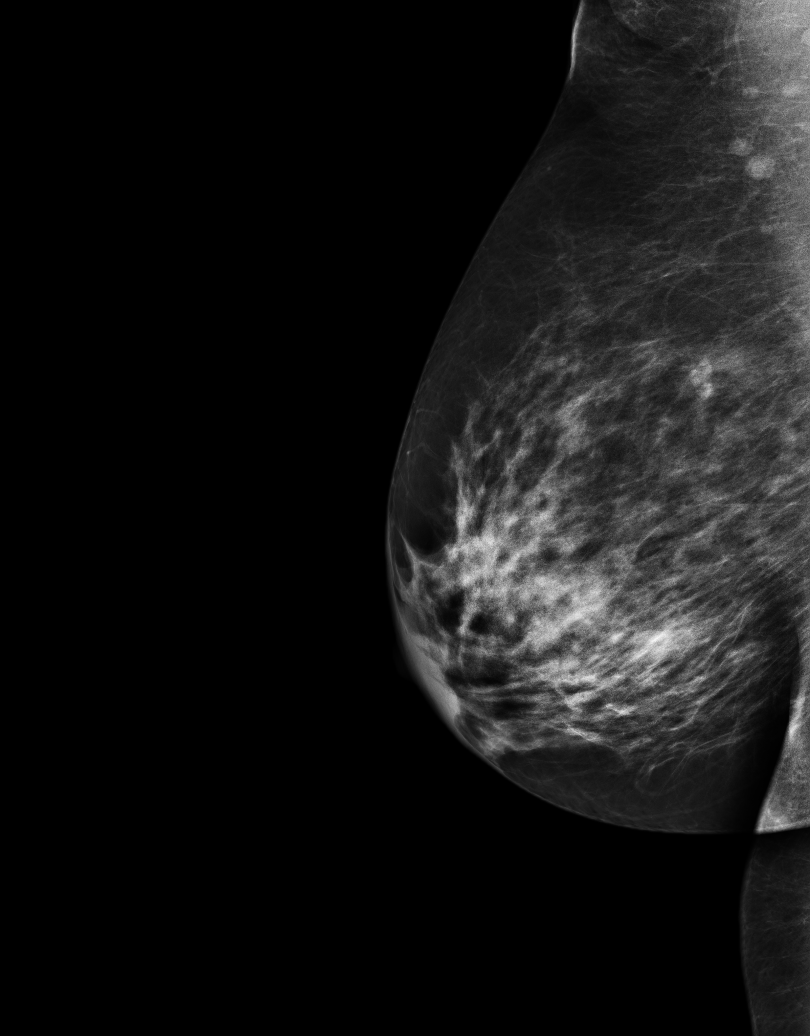
[im 4/6]
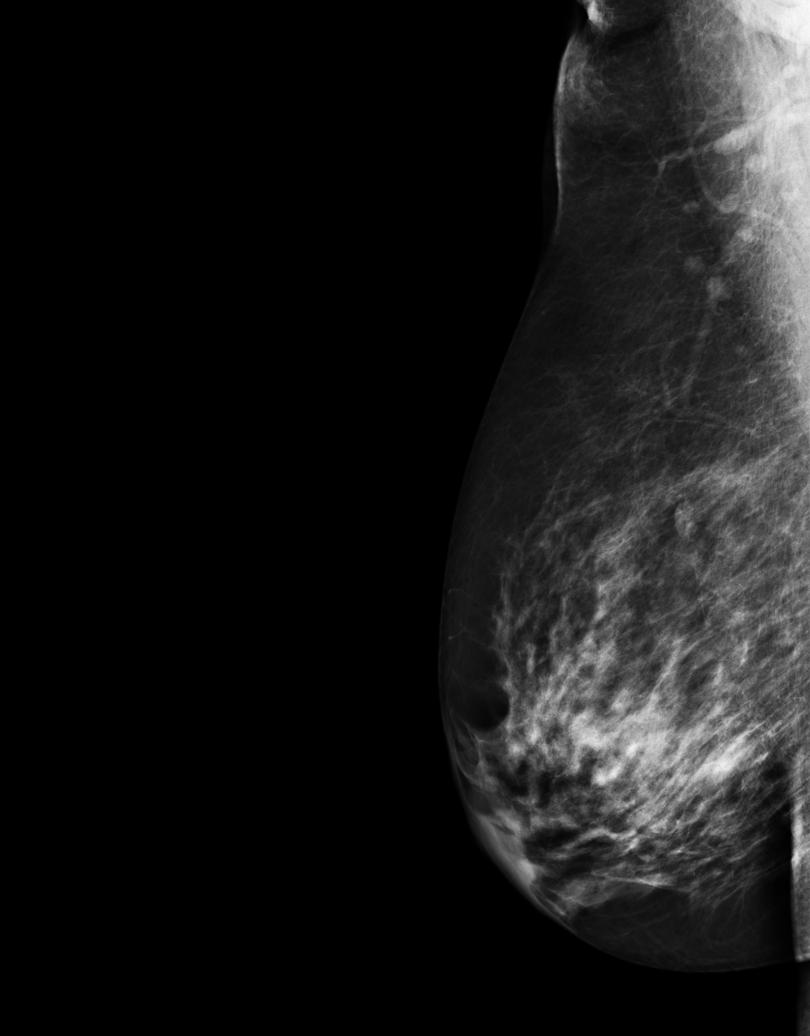
[im 5/6]
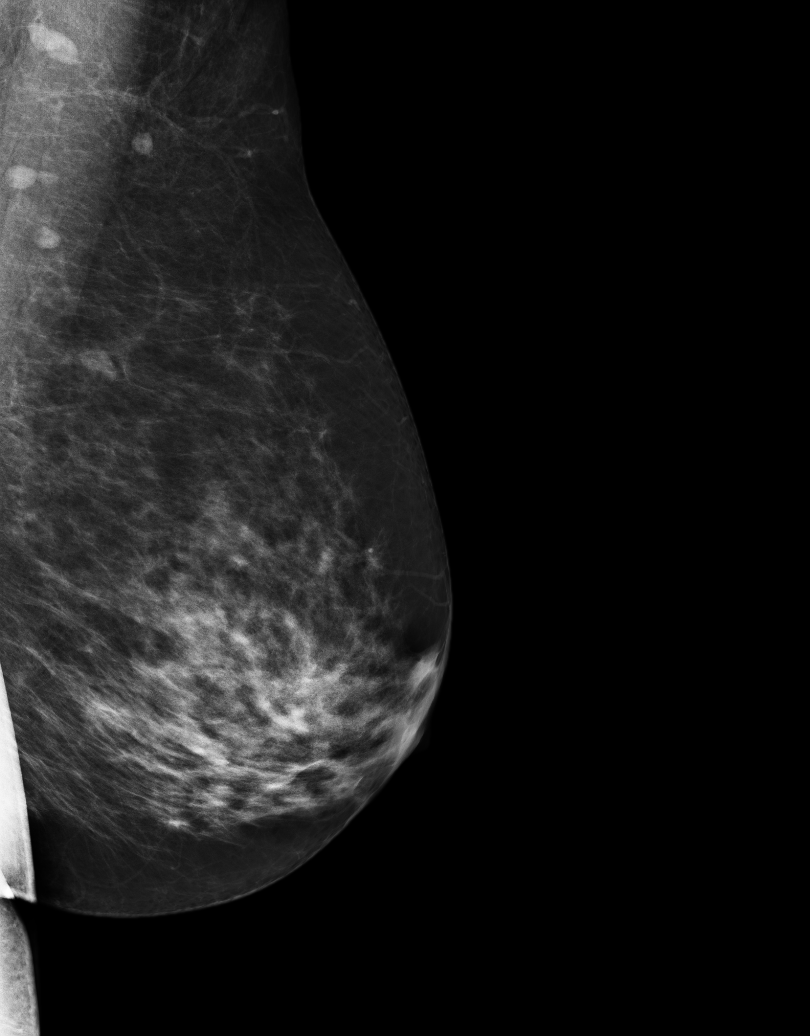
[im 6/6]
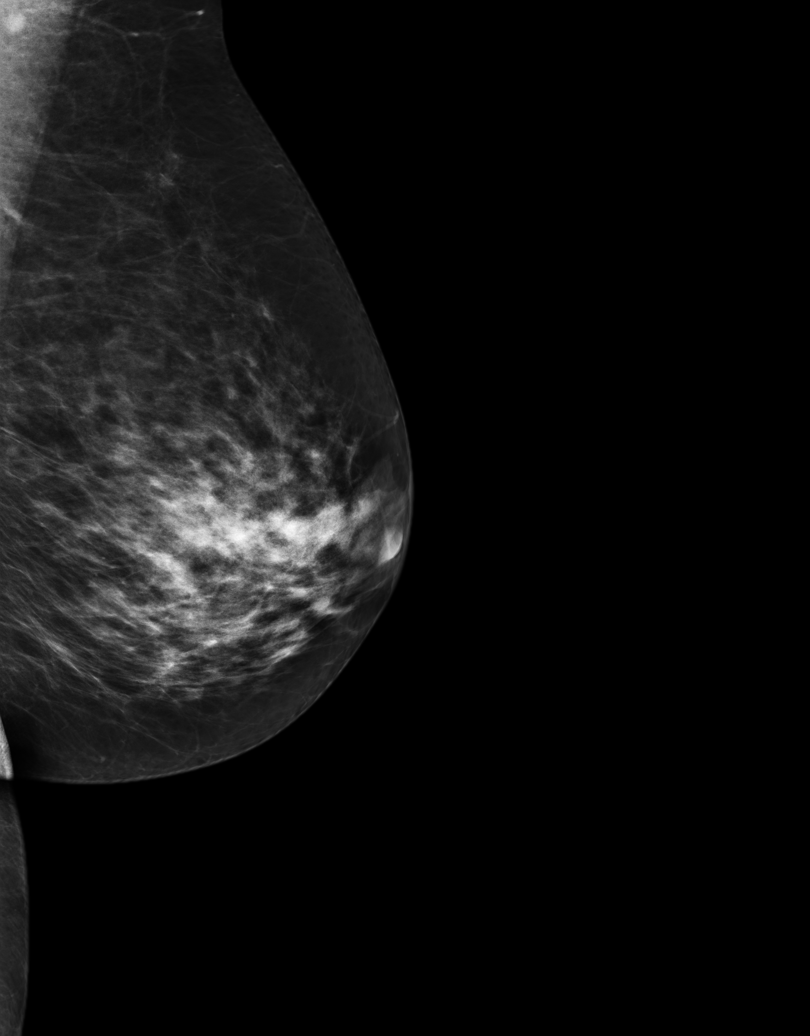

[6 of 6 positions shown; findings below may reference images not displayed]

ACR Breast Density Category c: The breast tissue is heterogeneously
dense, which may obscure small masses.
FINDINGS: There are no findings suspicious for malignancy. Images were
processed with CAD.
IMPRESSION: No mammographic evidence of malignancy. A result letter of this
screening mammogram will be mailed directly to the patient.

RECOMMENDATION:
Screening mammogram in one year. (Code:[id number])

BI-RADS CATEGORY  1: Negative.

## 2016-01-21 ENCOUNTER — Ambulatory Visit: Payer: Medicare Other | Admitting: Family Medicine

## 2016-01-23 ENCOUNTER — Other Ambulatory Visit: Payer: Self-pay | Admitting: Family Medicine

## 2016-01-23 DIAGNOSIS — R739 Hyperglycemia, unspecified: Secondary | ICD-10-CM

## 2016-01-23 DIAGNOSIS — E8881 Metabolic syndrome: Secondary | ICD-10-CM

## 2016-01-23 NOTE — Telephone Encounter (Signed)
Patient requesting refill. Atorvastatin and Metformin

## 2016-02-05 ENCOUNTER — Ambulatory Visit
Admission: EM | Admit: 2016-02-05 | Discharge: 2016-02-05 | Disposition: A | Discharge status: Home / Self Care | Payer: Medicare Other | Attending: Family Medicine | Admitting: Family Medicine

## 2016-02-05 DIAGNOSIS — J40 Bronchitis, not specified as acute or chronic: Secondary | ICD-10-CM | POA: Diagnosis not present

## 2016-02-05 DIAGNOSIS — J0100 Acute maxillary sinusitis, unspecified: Secondary | ICD-10-CM | POA: Diagnosis not present

## 2016-02-05 MED ORDER — PREDNISONE 10 MG PO TABS: ORAL_TABLET | ORAL | 0 refills | Status: DC

## 2016-02-05 MED ORDER — ALBUTEROL SULFATE HFA 108 (90 BASE) MCG/ACT IN AERS: 2.0000 | INHALATION_SPRAY | RESPIRATORY_TRACT | 0 refills | Status: DC | PRN

## 2016-02-05 MED ORDER — BENZONATATE 100 MG PO CAPS: 100.0000 mg | ORAL_CAPSULE | Freq: Three times a day (TID) | ORAL | 0 refills | Status: DC | PRN

## 2016-02-05 MED ORDER — DOXYCYCLINE HYCLATE 100 MG PO CAPS: 100.0000 mg | ORAL_CAPSULE | Freq: Two times a day (BID) | ORAL | 0 refills | Status: DC

## 2016-02-05 NOTE — ED Provider Notes (Signed)
[facility] ____________________________________________  Time seen: Approximately 5:21 PM  I have reviewed the triage vital signs and the nursing notes.   HISTORY  Chief Complaint Facial Pain (Sinus, nasal drainage, congestion)   HPI Alyssa Lawson is a 70 y.o. female presents with complaint of 1-2 weeks of runny nose, nasal congestion, postnasal drainage and cough. Patient reports over the last week she has hurt herself intermittently wheezing. Patient states that wheeze is mostly with active cough and does report that she coughs more at night with associated post nasal drainage. Patient states the cough is primarily a dry hacking cough. Patient reports she has continued to remain active and has felt well overall and has continued to work.  Patient reports that her sinuses feel clogged and congested. Reports continues to eat and drink well. Denies fevers. Reports symptoms and resolved with over-the-counter Mucinex. Reports history in the past of similar with her seasonal allergies and bronchitis. Denies sick contacts at home. Patient reports that she felt like this initiated with her seasonal allergies.  Denies any chest pain, shortness of breath, abdominal pain, dysuria, neck pain, back pain, extremity pain or extremity swelling. Denies recent hospital physician, recent surgeries or recent antibiotic use. Denies renal insufficiency.  PCP: Loistine Chance, MD  No LMP recorded. Patient has had a hysterectomy.   Past Medical History:  Diagnosis Date  . Bladder disorder   . GERD (gastroesophageal reflux disease)   . Hypertension     Patient Active Problem List   Diagnosis Date Noted  . Metabolic syndrome Q000111Q  . Mitral regurgitation 06/22/2015  . Melasma 12/21/2014  . History of hysterectomy 12/21/2014  . Restless leg syndrome 12/21/2014  . Benign essential HTN 12/17/2014  . Cataract 12/17/2014  . Dyslipidemia 12/17/2014  . Gastric reflux 12/17/2014  .  History of cervical cancer 12/17/2014  . H/O iron deficiency anemia 12/17/2014  . Urinary incontinence in female 12/17/2014  . Chronic back pain 12/17/2014  . Dysmetabolic syndrome 123456  . TI (tricuspid incompetence) 12/17/2014  . Osteopenia of the elderly 12/17/2014    Past Surgical History:  Procedure Laterality Date  . ABDOMINAL HYSTERECTOMY  1976  . OOPHORECTOMY     No current facility-administered medications for this encounter.   Current Outpatient Prescriptions:  .  acetaminophen (TYLENOL) 500 MG tablet, Take 1 tablet (500 mg total) by mouth every 6 (six) hours as needed for headache., Disp: 30 tablet, Rfl: 0 .  aspirin 81 MG chewable tablet, Chew 1 tablet by mouth daily., Disp: , Rfl:  .  atorvastatin (LIPITOR) 40 MG tablet, Take 1 tablet by mouth  daily, Disp: 90 tablet, Rfl: 1 .  carvedilol (COREG) 6.25 MG tablet, Take 1 tablet by mouth two  times daily, Disp: 180 tablet, Rfl: 1 .  ferrous sulfate 325 (65 FE) MG tablet, Take 325 mg by mouth daily with breakfast., Disp: , Rfl:  .  fluticasone (FLONASE) 50 MCG/ACT nasal spray, Use 1 spray in each nostril two times daily, Disp: 48 g, Rfl: 0 .  losartan (COZAAR) 50 MG tablet, Take 1 tablet by mouth  daily, Disp: 90 tablet, Rfl: 1 .  Magnesium Oxide 400 (240 MG) MG TABS, Take 1 tablet by mouth daily., Disp: , Rfl:  .  metFORMIN (GLUCOPHAGE) 500 MG tablet, Take 1 tablet by mouth two  times daily, Disp: 180 tablet, Rfl: 1 .  Multiple Vitamin (MULTIVITAMIN) capsule, Take 1 capsule by mouth daily., Disp: , Rfl:  .  oxybutynin (DITROPAN) 5 MG tablet,  Take 1 tablet by mouth  daily, Disp: 90 tablet, Rfl: 1 .  rOPINIRole (REQUIP) 2 MG tablet, Take 1 tablet by mouth at  bedtime, Disp: 90 tablet, Rfl: 1 .  sodium chloride (OCEAN) 0.65 % SOLN nasal spray, Place 2 sprays into both nostrils every 2 (two) hours while awake., Disp: , Rfl: 0 .  tiZANidine (ZANAFLEX) 2 MG tablet, Take 1 tablet by mouth at  bedtime, Disp: 90 tablet, Rfl: 0 .   triamterene-hydrochlorothiazide (MAXZIDE-25) 37.5-25 MG tablet, Take 1 tablet by mouth  daily, Disp: 90 tablet, Rfl: 1 .  valACYclovir (VALTREX) 500 MG tablet, Take 1 tablet by mouth 2 (two) times daily as needed., Disp: , Rfl:  .  ciprofloxacin (CIPRO) 250 MG tablet, Take 1 tablet (250 mg total) by mouth 2 (two) times daily., Disp: 6 tablet, Rfl: 0 .  omeprazole (PRILOSEC) 40 MG capsule, Take 40 mg by mouth daily., Disp: , Rfl:   Allergies Review of patient's allergies indicates no known allergies.  Family History  Problem Relation Age of Onset  . Heart failure Father   . Arrhythmia Father   . Heart attack Mother   . Arrhythmia Sister   . Cardiomyopathy Sister   . Congestive Heart Failure Brother     Social History Social History  Substance Use Topics  . Smoking status: Former Smoker    Packs/day: 1.00    Years: 40.00    Types: Cigarettes    Start date: 06/23/1956    Quit date: 06/23/1996  . Smokeless tobacco: Never Used  . Alcohol use 0.0 oz/week     Comment: socially    Review of Systems Constitutional: No fever/chills Eyes: No visual changes. ENT: No sore throat.As above Cardiovascular: Denies chest pain. Respiratory: Denies shortness of breath. Gastrointestinal: No abdominal pain.  No nausea, no vomiting.  No diarrhea.  No constipation. Genitourinary: Negative for dysuria. Musculoskeletal: Negative for back pain. Skin: Negative for rash. Neurological: Negative for headaches, focal weakness or numbness.  10-point ROS otherwise negative.  ____________________________________________   PHYSICAL EXAM:  VITAL SIGNS: ED Triage Vitals  Enc Vitals Group     BP 02/05/16 1705 (!) 160/71     Pulse Rate 02/05/16 1705 72     Resp 02/05/16 1705 18     Temp 02/05/16 1705 98 F (36.7 C)     Temp Source 02/05/16 1705 Oral     SpO2 02/05/16 1705 96 %     Weight 02/05/16 1702 165 lb (74.8 kg)     Height 02/05/16 1702 5\' 4"  (1.626 m)     Head Circumference --      Peak  Flow --      Pain Score 02/05/16 1705 0     Pain Loc --      Pain Edu? --      Excl. in [place]? --     Constitutional: Alert and oriented. Well appearing and in no acute distress. Eyes: Conjunctivae are normal. PERRL. EOMI. Head: Atraumatic.Mild tenderness to palpation bilateral frontal and maxillary sinuses. No swelling. No erythema.   Ears: no erythema, normal TMs bilaterally.   Nose: nasal congestion with bilateral nasal turbinate erythema and edema.   Mouth/Throat: Mucous membranes are moist.  Oropharynx non-erythematous.No tonsillar swelling or exudate.  Neck: No stridor.  No cervical spine tenderness to palpation. Hematological/Lymphatic/Immunilogical: No cervical lymphadenopathy. Cardiovascular: Normal rate, regular rhythm. Grossly normal heart sounds.  Good peripheral circulation. Respiratory: Normal respiratory effort.  No retractions. No rales or rhonchi. Occasional inspiratory scattered wheezes noted,  cleared with cough.. Dry intermittent cough noted in room. Good air movement. No focal area of consolidation auscultated. Gastrointestinal: Soft and nontender. No distention. No CVA tenderness. Musculoskeletal: No lower or upper extremity tenderness nor edema.  Bilateral pedal pulses equal and easily palpated. No cervical, thoracic or lumbar tenderness to palpation. No calf tenderness bilaterally. Neurologic:  Normal speech and language. No gross focal neurologic deficits are appreciated. No gait instability. Skin:  Skin is warm, dry and intact. No rash noted. Psychiatric: Mood and affect are normal. Speech and behavior are normal.  ___________________________________________   LABS (all labs ordered are listed, but only abnormal results are displayed)  Labs Reviewed - No data to display   PROCEDURES Procedures   INITIAL IMPRESSION / ASSESSMENT AND PLAN / ED COURSE  Pertinent labs & imaging results that were available during my care of the patient were reviewed by me and  considered in my medical decision making (see chart for details).  Well-appearing patient. No acute distress. Presents for complaints of 1-2 weeks of runny nose, nasal congestion, cough with occasional wheezing.Denies fevers.  Denies chest pain or shortness of breath. Lungs clear throughout except for occasional wheeze. No rhonchi or rales. Suspect sinusitis and bronchitis. Discussed evaluation with chest x-ray, patient declines and reports she will follow up with PCP or return to urgent care symptoms persist for chest x-ray at that time. Will treat patient with oral prednisone taper, when necessary albuterol inhaler, doxycycline and when necessary Tessalon Perles. Encouraged rest, fluids and PCP follow-up.Discussed indication, risks and benefits of medications with patient. Discussed photosensitivity of antibiotic.  Discussed follow up with Primary care physician this week. Discussed follow up and return parameters including no resolution or any worsening concerns. Patient verbalized understanding and agreed to plan.   ____________________________________________   FINAL CLINICAL IMPRESSION(S) / ED DIAGNOSES  Final diagnoses:  Bronchitis  Acute maxillary sinusitis, recurrence not specified     Discharge Medication List as of 02/05/2016  5:35 PM    START taking these medications   Details  albuterol (PROVENTIL HFA;VENTOLIN HFA) 108 (90 Base) MCG/ACT inhaler Inhale 2 puffs into the lungs every 4 (four) hours as needed., Starting Tue 02/05/2016, Normal    benzonatate (TESSALON PERLES) 100 MG capsule Take 1 capsule (100 mg total) by mouth 3 (three) times daily as needed for cough., Starting Tue 02/05/2016, Normal    doxycycline (VIBRAMYCIN) 100 MG capsule Take 1 capsule (100 mg total) by mouth 2 (two) times daily., Starting Tue 02/05/2016, Normal    predniSONE (DELTASONE) 10 MG tablet Start 60 mg po day one, then 50 mg po day two, taper by 10 mg daily until complete., Normal        Note:  This dictation was prepared with Dragon dictation along with smaller phrase technology. Any transcriptional errors that result from this process are unintentional.    Clinical Course      Marylene Land, NP 02/05/16 1818

## 2016-02-05 NOTE — Discharge Instructions (Signed)
Take medication as prescribed. Rest. Drink plenty of fluids.   Follow up with your primary care physician this week as needed. Return to Urgent care or ER for no improvement or for new or worsening concerns.

## 2016-02-05 NOTE — ED Triage Notes (Signed)
Patient complains of sinus, nasal congestion, chest congestion, coughing that started the last week of July.  She says that she has had some wheezing that started last week.

## 2016-02-20 ENCOUNTER — Ambulatory Visit (INDEPENDENT_AMBULATORY_CARE_PROVIDER_SITE_OTHER): Payer: Medicare Other | Admitting: Family Medicine

## 2016-02-20 ENCOUNTER — Encounter: Payer: Self-pay | Admitting: Family Medicine

## 2016-02-20 VITALS — BP 122/76 | HR 89 | Temp 98.2°F | Resp 16 | Ht 64.0 in | Wt 168.2 lb

## 2016-02-20 DIAGNOSIS — I10 Essential (primary) hypertension: Secondary | ICD-10-CM | POA: Diagnosis not present

## 2016-02-20 DIAGNOSIS — G2581 Restless legs syndrome: Secondary | ICD-10-CM | POA: Diagnosis not present

## 2016-02-20 DIAGNOSIS — E8881 Metabolic syndrome: Secondary | ICD-10-CM | POA: Diagnosis not present

## 2016-02-20 DIAGNOSIS — E785 Hyperlipidemia, unspecified: Secondary | ICD-10-CM

## 2016-02-20 DIAGNOSIS — Z0000 Encounter for general adult medical examination without abnormal findings: Secondary | ICD-10-CM

## 2016-02-20 DIAGNOSIS — A6000 Herpesviral infection of urogenital system, unspecified: Secondary | ICD-10-CM

## 2016-02-20 DIAGNOSIS — G8929 Other chronic pain: Secondary | ICD-10-CM

## 2016-02-20 DIAGNOSIS — Z862 Personal history of diseases of the blood and blood-forming organs and certain disorders involving the immune mechanism: Secondary | ICD-10-CM

## 2016-02-20 DIAGNOSIS — Z1231 Encounter for screening mammogram for malignant neoplasm of breast: Secondary | ICD-10-CM

## 2016-02-20 DIAGNOSIS — R739 Hyperglycemia, unspecified: Secondary | ICD-10-CM

## 2016-02-20 DIAGNOSIS — M549 Dorsalgia, unspecified: Secondary | ICD-10-CM

## 2016-02-20 MED ORDER — VALACYCLOVIR HCL 500 MG PO TABS: 500.0000 mg | ORAL_TABLET | Freq: Three times a day (TID) | ORAL | 0 refills | Status: DC

## 2016-02-20 MED ORDER — LOSARTAN POTASSIUM 100 MG PO TABS: 100.0000 mg | ORAL_TABLET | Freq: Every day | ORAL | 1 refills | Status: DC

## 2016-02-20 MED ORDER — DICLOFENAC SODIUM 1 % TD GEL: 4.0000 g | Freq: Four times a day (QID) | TRANSDERMAL | 0 refills | Status: DC

## 2016-02-20 MED ORDER — LOSARTAN POTASSIUM 50 MG PO TABS: 50.0000 mg | ORAL_TABLET | Freq: Every day | ORAL | 1 refills | Status: DC

## 2016-02-20 MED ORDER — TRIAMTERENE-HCTZ 37.5-25 MG PO TABS: 0.5000 | ORAL_TABLET | Freq: Every day | ORAL | 0 refills | Status: DC

## 2016-02-20 MED ORDER — ROPINIROLE HCL 2 MG PO TABS: 2.0000 mg | ORAL_TABLET | Freq: Every day | ORAL | 1 refills | Status: DC

## 2016-02-20 MED ORDER — TRIAMTERENE-HCTZ 37.5-25 MG PO TABS: 1.0000 | ORAL_TABLET | Freq: Every day | ORAL | 1 refills | Status: DC

## 2016-02-20 MED ORDER — SOLIFENACIN SUCCINATE 10 MG PO TABS: 10.0000 mg | ORAL_TABLET | Freq: Every day | ORAL | 1 refills | Status: DC

## 2016-02-20 MED ORDER — CARVEDILOL 6.25 MG PO TABS: 6.2500 mg | ORAL_TABLET | Freq: Two times a day (BID) | ORAL | 1 refills | Status: DC

## 2016-02-20 MED ORDER — TIZANIDINE HCL 2 MG PO TABS: 2.0000 mg | ORAL_TABLET | Freq: Every day | ORAL | 0 refills | Status: DC

## 2016-02-20 NOTE — Progress Notes (Signed)
Name: Alyssa Lawson   MRN: [id number]    DOB: 1946-04-08   Date:02/20/2016       Progress Note  Subjective  Chief Complaint  Chief Complaint  Patient presents with  . Annual Exam    HPI  Functional ability/safety issues: No Issues Hearing issues: Addressed  Activities of daily living: Discussed Home safety issues: No Issues  End Of Life Planning: Offered verbal information regarding advanced directives, healthcare power of attorney.  Preventative care, Health maintenance, Preventative health measures discussed.  Preventative screenings discussed today: lab work, colonoscopy,  mammogram, DEXA.  Low Dose CT Chest recommended if Age 29-80 years, 30 pack-year currently smoking OR have quit w/in 15years.   Lifestyle risk factor issued reviewed: Diet, exercise, weight management, advised patient smoking is not healthy, nutrition/diet.  Preventative health measures discussed (5-10 year plan).  Reviewed and recommended vaccinations: - Pneumovax  - Prevnar  - Annual Influenza - Zostavax - Tdap   Depression screening: Done Fall risk screening: Done Discuss ADLs/IADLs: Done  Current medical providers: See HPI  Other health risk factors identified this visit: No other issues Cognitive impairment issues: None identified  All above discussed with patient. Appropriate education, counseling and referral will be made based upon the above.   HTN: taking medications, no side effects and bp is under control, no chest pain, no palpitation . She has urinary frequency and discussed decreasing diuretic and maybe stopping it, she will try   Hyperlipidemia: last labs done last Decembe and she is no on Atorvastatin. Denies side effects, no myalgias  HDL is very good  GERD: she is off Omeprazole, occasionally has heartburn and takes Tums or Zantac prn .   Metabolic Syndrome: last Q000111Q was 6.4 % , but is down to 6.1%  she denies polyphagia or polydipsia, she has urinary frequency    Urinary incontinence: she has been on Ditropan for many years, but symptoms are getting worse, she can turn on the faucet because it makes her want to void. We will decrease diuretic and change to Vesicare  RLS: better with medication, sleeping well with medication,- Requip -very seldom has symptoms.   Chronic Back Pain: she has daily low back pain, describes a dull pain , 1/10- she recently took prednisone for bronchitis and pain resolved for days.  no radiation. No weakens or tingling. She takes Tylenol prn and also Tizanidine  prn   Tennis Elbow: symptoms are back, pain is constant, advised to resume wearing brace, we will add voltaren gel also  Patient Active Problem List   Diagnosis Date Noted  . Metabolic syndrome Q000111Q  . Mitral regurgitation 06/22/2015  . Melasma 12/21/2014  . History of hysterectomy 12/21/2014  . Restless leg syndrome 12/21/2014  . Benign essential HTN 12/17/2014  . Cataract 12/17/2014  . Dyslipidemia 12/17/2014  . Gastric reflux 12/17/2014  . History of cervical cancer 12/17/2014  . H/O iron deficiency anemia 12/17/2014  . Urinary incontinence in female 12/17/2014  . Chronic back pain 12/17/2014  . Dysmetabolic syndrome 123456  . TI (tricuspid incompetence) 12/17/2014  . Osteopenia of the elderly 12/17/2014    Past Surgical History:  Procedure Laterality Date  . ABDOMINAL HYSTERECTOMY  1976  . OOPHORECTOMY      Family History  Problem Relation Age of Onset  . Heart failure Father   . Arrhythmia Father   . Heart attack Mother   . Arrhythmia Sister   . Cardiomyopathy Sister   . Congestive Heart Failure Brother  Social History   Social History  . Marital status: Married    Spouse name: N/A  . Number of children: N/A  . Years of education: N/A   Occupational History  . Not on file.   Social History Main Topics  . Smoking status: Former Smoker    Packs/day: 1.00    Years: 40.00    Types: Cigarettes    Start date:  06/23/1956    Quit date: 06/23/1996  . Smokeless tobacco: Never Used  . Alcohol use 0.0 oz/week     Comment: socially  . Drug use: No  . Sexual activity: Yes    Partners: Male   Other Topics Concern  . Not on file   Social History Narrative  . No narrative on file     Current Outpatient Prescriptions:  .  acetaminophen (TYLENOL) 500 MG tablet, Take 1 tablet (500 mg total) by mouth every 6 (six) hours as needed for headache., Disp: 30 tablet, Rfl: 0 .  aspirin 81 MG chewable tablet, Chew 1 tablet by mouth daily., Disp: , Rfl:  .  atorvastatin (LIPITOR) 40 MG tablet, Take 1 tablet by mouth  daily, Disp: 90 tablet, Rfl: 1 .  carvedilol (COREG) 6.25 MG tablet, Take 1 tablet (6.25 mg total) by mouth 2 (two) times daily., Disp: 180 tablet, Rfl: 1 .  ferrous sulfate 325 (65 FE) MG tablet, Take 325 mg by mouth daily with breakfast., Disp: , Rfl:  .  fluticasone (FLONASE) 50 MCG/ACT nasal spray, Use 1 spray in each nostril two times daily, Disp: 48 g, Rfl: 0 .  losartan (COZAAR) 50 MG tablet, Take 1 tablet (50 mg total) by mouth daily., Disp: 90 tablet, Rfl: 1 .  Magnesium Oxide 400 (240 MG) MG TABS, Take 1 tablet by mouth daily., Disp: , Rfl:  .  metFORMIN (GLUCOPHAGE) 500 MG tablet, Take 1 tablet by mouth two  times daily, Disp: 180 tablet, Rfl: 1 .  Multiple Vitamin (MULTIVITAMIN) capsule, Take 1 capsule by mouth daily., Disp: , Rfl:  .  rOPINIRole (REQUIP) 2 MG tablet, Take 1 tablet by mouth at  bedtime, Disp: 90 tablet, Rfl: 1 .  tiZANidine (ZANAFLEX) 2 MG tablet, Take 1 tablet by mouth at  bedtime, Disp: 90 tablet, Rfl: 0 .  triamterene-hydrochlorothiazide (MAXZIDE-25) 37.5-25 MG tablet, Take 1 tablet by mouth daily., Disp: 90 tablet, Rfl: 1 .  valACYclovir (VALTREX) 500 MG tablet, Take 1 tablet by mouth 2 (two) times daily as needed., Disp: , Rfl:  .  solifenacin (VESICARE) 10 MG tablet, Take 1 tablet (10 mg total) by mouth daily., Disp: 90 tablet, Rfl: 1  No Known  Allergies   ROS  Constitutional: Negative for fever or weight change.  Respiratory: Negative for cough and shortness of breath.   Cardiovascular: Negative for chest pain or palpitations.  Gastrointestinal: Negative for abdominal pain, no bowel changes.  Musculoskeletal: Negative for gait problem or joint swelling.  Skin: Negative for rash.  Neurological: Negative for dizziness or headache.  No other specific complaints in a complete review of systems (except as listed in HPI above).  Objective  Vitals:   02/20/16 0845  BP: 122/76  Pulse: 89  Resp: 16  Temp: 98.2 F (36.8 C)  TempSrc: Oral  SpO2: 96%  Weight: 168 lb 3.2 oz (76.3 kg)  Height: 5\' 4"  (1.626 m)    Body mass index is 28.87 kg/m.  Physical Exam  Constitutional: Patient appears well-developed and well-nourished. No distress.  HENT: Head: Normocephalic and atraumatic.  Ears: B TMs ok, no erythema or effusion; Nose: Nose normal. Mouth/Throat: Oropharynx is clear and moist. No oropharyngeal exudate.  Eyes: Conjunctivae and EOM are normal. Pupils are equal, round, and reactive to light. No scleral icterus.  Neck: Normal range of motion. Neck supple. No JVD present. No thyromegaly present.  Cardiovascular: Normal rate, regular rhythm and normal heart sounds.  SEM 2/6 . No BLE edema. Pulmonary/Chest: Effort normal and breath sounds normal. No respiratory distress. Abdominal: Soft. Bowel sounds are normal, no distension. There is no tenderness. no masses Breast: no lumps or masses, no nipple discharge or rashes FEMALE GENITALIA:  External genitalia normal External urethra normal Vaginal vault normal without discharge or lesions Pelvic not done RECTAL: not done Musculoskeletal: Normal range of motion, no joint effusions. No gross deformities Neurological: he is alert and oriented to person, place, and time. No cranial nerve deficit. Coordination, balance, strength, speech and gait are normal.  Skin: Skin is warm and  dry. No rash noted. No erythema.  Psychiatric: Patient has a normal mood and affect. behavior is normal. Judgment and thought content normal.  PHQ2/9: Depression screen [facility] 2/9 02/20/2016 06/22/2015 12/21/2014  Decreased Interest 0 0 0  Down, Depressed, Hopeless 0 0 0  PHQ - 2 Score 0 0 0     Fall Risk: Fall Risk  02/20/2016 06/22/2015 12/21/2014  Falls in the past year? Yes No No  Number falls in past yr: 1 - -  Injury with Fall? No - -   Fell 08/07, slipped on a slippery surface ( [employer] - spill)    Functional Status Survey: Is the patient deaf or have difficulty hearing?: No Does the patient have difficulty seeing, even when wearing glasses/contacts?: No Does the patient have difficulty concentrating, remembering, or making decisions?: No Does the patient have difficulty walking or climbing stairs?: No Does the patient have difficulty dressing or bathing?: No Does the patient have difficulty doing errands alone such as visiting a doctor's office or shopping?: No    Assessment & Plan  1. Medicare annual wellness visit, subsequent  Discussed importance of 150 minutes of physical activity weekly, eat two servings of fish weekly, eat one serving of tree nuts ( cashews, pistachios, pecans, almonds.Marland Kitchen) every other day, eat 6 servings of fruit/vegetables daily and drink plenty of water and avoid sweet beverages.   2. Benign essential HTN  - COMPLETE METABOLIC PANEL WITH GFR - CBC with Differential/Platelet - losartan (COZAAR) 100 MG tablet; Take 1 tablet (100 mg total) by mouth daily.  Dispense: 90 tablet; Refill: 1 - triamterene-hydrochlorothiazide (MAXZIDE-25) 37.5-25 MG tablet; Take 0.5-1 tablets by mouth daily.  Dispense: 90 tablet; Refill: 0  3. Dyslipidemia  - Lipid panel  4. Hyperglycemia  - Hemoglobin A1c  5. Dysmetabolic syndrome  Recheck labs  6. Chronic back pain  - tiZANidine (ZANAFLEX) 2 MG tablet; Take 1 tablet (2 mg total) by mouth at bedtime.  Dispense:  90 tablet; Refill: 0  7. History of anemia  - CBC with Differential/Platelet  8. Encounter for screening mammogram for breast cancer  -mammogram   9. RLS (restless legs syndrome)  - rOPINIRole (REQUIP) 2 MG tablet; Take 1 tablet (2 mg total) by mouth at bedtime.  Dispense: 90 tablet; Refill: 1  10. Genital herpes  Episodes about once a month at most, and takes medications prn  - valACYclovir (VALTREX) 500 MG tablet; Take 1 tablet (500 mg total) by mouth 3 (three) times daily. Per episode prn, take for 10 days  Dispense: 90 tablet; Refill: 0  10. Genital herpes  - valACYclovir (VALTREX) 500 MG tablet; Take 1 tablet (500 mg total) by mouth 3 (three) times daily. Per episode prn, take for 10 days  Dispense: 90 tablet; Refill: 0

## 2016-02-21 ENCOUNTER — Telehealth: Payer: Self-pay | Admitting: Family Medicine

## 2016-02-21 NOTE — Telephone Encounter (Signed)
I don't know what this means. She needs emergency supply. Okay to approve it. Thank you

## 2016-02-22 NOTE — Telephone Encounter (Signed)
Did you notify the patient?

## 2016-02-22 NOTE — Telephone Encounter (Signed)
This was denied

## 2016-02-27 ENCOUNTER — Other Ambulatory Visit: Payer: Self-pay | Admitting: Family Medicine

## 2016-02-27 ENCOUNTER — Encounter: Payer: Self-pay | Admitting: Family Medicine

## 2016-02-27 DIAGNOSIS — A6000 Herpesviral infection of urogenital system, unspecified: Secondary | ICD-10-CM

## 2016-02-27 MED ORDER — VALACYCLOVIR HCL 500 MG PO TABS: 500.0000 mg | ORAL_TABLET | Freq: Two times a day (BID) | ORAL | 0 refills | Status: DC

## 2016-02-27 NOTE — Telephone Encounter (Signed)
Patient Insurance will only cover 2 pills a day instead of 3, can you change prescription due to Insurance coverage?

## 2016-02-27 NOTE — Telephone Encounter (Signed)
changed

## 2016-02-28 ENCOUNTER — Encounter: Payer: Self-pay | Admitting: Family Medicine

## 2016-03-04 ENCOUNTER — Other Ambulatory Visit: Payer: Self-pay | Admitting: Family Medicine

## 2016-03-04 ENCOUNTER — Encounter: Payer: Self-pay | Admitting: Family Medicine

## 2016-03-04 DIAGNOSIS — A6000 Herpesviral infection of urogenital system, unspecified: Secondary | ICD-10-CM

## 2016-03-04 DIAGNOSIS — G8929 Other chronic pain: Secondary | ICD-10-CM

## 2016-03-04 DIAGNOSIS — M549 Dorsalgia, unspecified: Principal | ICD-10-CM

## 2016-03-04 MED ORDER — VALACYCLOVIR HCL 500 MG PO TABS: 500.0000 mg | ORAL_TABLET | Freq: Two times a day (BID) | ORAL | 0 refills | Status: DC

## 2016-03-04 NOTE — Telephone Encounter (Signed)
Patient requesting refill of Tizanidine be sent to [facility].

## 2016-03-31 ENCOUNTER — Other Ambulatory Visit: Payer: Self-pay | Admitting: Family Medicine

## 2016-03-31 ENCOUNTER — Encounter: Payer: Self-pay | Admitting: Family Medicine

## 2016-05-07 ENCOUNTER — Other Ambulatory Visit: Payer: Self-pay | Admitting: Family Medicine

## 2016-05-07 DIAGNOSIS — G8929 Other chronic pain: Secondary | ICD-10-CM

## 2016-05-07 DIAGNOSIS — M549 Dorsalgia, unspecified: Principal | ICD-10-CM

## 2016-05-07 NOTE — Telephone Encounter (Signed)
Patient requesting refill of Flonase to [facility].

## 2016-05-08 NOTE — Telephone Encounter (Signed)
Patient requesting refill of Tizanidine to [facility].

## 2016-05-24 ENCOUNTER — Other Ambulatory Visit: Payer: Self-pay | Admitting: Family Medicine

## 2016-05-24 DIAGNOSIS — A6000 Herpesviral infection of urogenital system, unspecified: Secondary | ICD-10-CM

## 2016-05-28 ENCOUNTER — Other Ambulatory Visit: Payer: Self-pay | Admitting: Family Medicine

## 2016-06-06 ENCOUNTER — Ambulatory Visit (INDEPENDENT_AMBULATORY_CARE_PROVIDER_SITE_OTHER): Payer: Medicare Other

## 2016-06-06 DIAGNOSIS — Z23 Encounter for immunization: Secondary | ICD-10-CM

## 2016-06-20 ENCOUNTER — Encounter: Payer: Self-pay | Admitting: Emergency Medicine

## 2016-06-20 ENCOUNTER — Ambulatory Visit
Admission: EM | Admit: 2016-06-20 | Discharge: 2016-06-20 | Disposition: A | Discharge status: Home / Self Care | Payer: Medicare Other | Attending: Family Medicine | Admitting: Family Medicine

## 2016-06-20 DIAGNOSIS — M7702 Medial epicondylitis, left elbow: Secondary | ICD-10-CM | POA: Diagnosis not present

## 2016-06-20 DIAGNOSIS — R05 Cough: Secondary | ICD-10-CM | POA: Diagnosis not present

## 2016-06-20 DIAGNOSIS — R059 Cough, unspecified: Secondary | ICD-10-CM

## 2016-06-20 DIAGNOSIS — J209 Acute bronchitis, unspecified: Secondary | ICD-10-CM

## 2016-06-20 MED ORDER — HYDROCOD POLST-CPM POLST ER 10-8 MG/5ML PO SUER: 5.0000 mL | Freq: Two times a day (BID) | ORAL | 0 refills | Status: DC | PRN

## 2016-06-20 MED ORDER — FEXOFENADINE-PSEUDOEPHED ER 180-240 MG PO TB24: 1.0000 | ORAL_TABLET | Freq: Every day | ORAL | 0 refills | Status: DC

## 2016-06-20 MED ORDER — MELOXICAM 15 MG PO TABS: 15.0000 mg | ORAL_TABLET | Freq: Every day | ORAL | 0 refills | Status: DC

## 2016-06-20 MED ORDER — AZITHROMYCIN 500 MG PO TABS: ORAL_TABLET | ORAL | 0 refills | Status: DC

## 2016-06-20 NOTE — ED Provider Notes (Signed)
[facility]    CSN: [id number] Arrival date & time: 06/20/16  1419     History   Chief Complaint Chief Complaint  Patient presents with  . Cough  . Elbow Pain    left    HPI Alyssa Lawson is a 70 y.o. female.   Patient's here because of 2 problems first problem is her cough and nasal congestion. She states everything started about Christmas Day. She reports exercising just her son and he had a URI she thinks that he shared his URI with her by that evening she started coughing and having chest congestion. She's had bronchitis before however she is also running a fever and coughing. She states is thick white stuff that she's coughing up. She has had one episode of emesis as well.  She also reports back in summer started having pain in her left elbow on the medial side. Once that started she's had recurrent pain and discomfort. Sometimes she sleeps on the elbow at night and wake her up in follow-up for a while. She was seen here this summer and treated for bronchitis and placed on a short course of prednisone was told that time the prednisone should help with the tendinitis and she states. Currently on prednisone for to 3 days.   The history is provided by the patient.  Cough  Cough characteristics:  Productive Sputum characteristics:  White Severity:  Moderate Timing:  Constant Progression:  Worsening Chronicity:  New Context: sick contacts and upper respiratory infection   Relieved by:  Nothing Ineffective treatments:  Cough suppressants Associated symptoms: fever and myalgias   Associated symptoms: no chest pain   Arm Injury  Location:  Elbow Elbow location:  L elbow Injury: no   Pain details:    Quality:  Aching, shooting and throbbing   Radiates to:  L shoulder, L upper arm, L forearm and L elbow   Severity:  Moderate   Onset quality:  Sudden   Progression:  Worsening Handedness:  Right-handed Dislocation: no   Prior injury to area:  No Relieved by:   Nothing Worsened by:  Bearing weight, movement and stretching area Ineffective treatments: Short course of prednisone for bronchitis. Associated symptoms: fever     Past Medical History:  Diagnosis Date  . Bladder disorder   . GERD (gastroesophageal reflux disease)   . Hypertension     Patient Active Problem List   Diagnosis Date Noted  . Metabolic syndrome [id number]  . Mitral regurgitation 06/22/2015  . Melasma 12/21/2014  . History of hysterectomy 12/21/2014  . Restless leg syndrome 12/21/2014  . Benign essential HTN 12/17/2014  . Cataract 12/17/2014  . Dyslipidemia 12/17/2014  . Gastric reflux 12/17/2014  . History of cervical cancer 12/17/2014  . H/O iron deficiency anemia 12/17/2014  . Urinary incontinence in female 12/17/2014  . Chronic back pain 12/17/2014  . Dysmetabolic syndrome [id number]  . TI (tricuspid incompetence) 12/17/2014  . Osteopenia of the elderly 12/17/2014    Past Surgical History:  Procedure Laterality Date  . ABDOMINAL HYSTERECTOMY  1976  . OOPHORECTOMY      OB History    No data available       Home Medications    Prior to Admission medications   Medication Sig Start Date End Date Taking? Authorizing Provider  acetaminophen (TYLENOL) 500 MG tablet Take 1 tablet (500 mg total) by mouth every 6 (six) hours as needed for headache. 11/20/14   Olen Cordial, NP  aspirin 81 MG chewable  tablet Chew 1 tablet by mouth daily. 01/23/11   Historical Provider, MD  atorvastatin (LIPITOR) 40 MG tablet Take 1 tablet by mouth  daily 01/23/16   Steele Sizer, MD  azithromycin (ZITHROMAX) 500 MG tablet 1 tablet a day for the next 5 days 06/20/16   Frederich Cha, MD  carvedilol (COREG) 6.25 MG tablet Take 1 tablet (6.25 mg total) by mouth 2 (two) times daily. 02/20/16   Steele Sizer, MD  chlorpheniramine-HYDROcodone (TUSSIONEX PENNKINETIC ER) 10-8 MG/5ML SUER Take 5 mLs by mouth every 12 (twelve) hours as needed for cough. 06/20/16   Frederich Cha, MD    diclofenac sodium (VOLTAREN) 1 % GEL Apply 4 g topically 4 (four) times daily. 02/20/16   Steele Sizer, MD  ferrous sulfate 325 (65 FE) MG tablet Take 325 mg by mouth daily with breakfast.    Historical Provider, MD  fexofenadine-pseudoephedrine (ALLEGRA-D ALLERGY & CONGESTION) 180-240 MG 24 hr tablet Take 1 tablet by mouth daily. 06/20/16   Frederich Cha, MD  fluticasone [facility]) 50 MCG/ACT nasal spray USE 1 SPRAY IN [facility] NOSTRIL TWO TIMES DAILY 05/08/16   Steele Sizer, MD  losartan (COZAAR) 100 MG tablet Take 1 tablet (100 mg total) by mouth daily. 02/20/16   Steele Sizer, MD  Magnesium Oxide 400 (240 MG) MG TABS Take 1 tablet by mouth daily. 03/15/13   Historical Provider, MD  meloxicam (MOBIC) 15 MG tablet Take 1 tablet (15 mg total) by mouth daily. 06/20/16   Frederich Cha, MD  metFORMIN (GLUCOPHAGE) 500 MG tablet Take 1 tablet by mouth two  times daily 01/23/16   Steele Sizer, MD  Multiple Vitamin (MULTIVITAMIN) capsule Take 1 capsule by mouth daily.    Historical Provider, MD  rOPINIRole (REQUIP) 2 MG tablet Take 1 tablet (2 mg total) by mouth at bedtime. 02/20/16   Steele Sizer, MD  tiZANidine (ZANAFLEX) 2 MG tablet TAKE 1 TABLET BY MOUTH AT  BEDTIME 05/08/16   Steele Sizer, MD  triamterene-hydrochlorothiazide (MAXZIDE-25) 37.5-25 MG tablet Take 0.5-1 tablets by mouth daily. 02/20/16   Steele Sizer, MD  valACYclovir (VALTREX) 500 MG tablet TAKE 1 TABLET BY MOUTH 2  TIMES DAILY. PER EPISODE AS NEEDED, TAKE FOR 10 DAYS 05/25/16   Steele Sizer, MD  VESICARE 10 MG tablet TAKE 1 TABLET BY MOUTH  DAILY 05/28/16   Steele Sizer, MD    Family History Family History  Problem Relation Age of Onset  . Heart failure Father   . Arrhythmia Father   . Heart attack Mother   . Arrhythmia Sister   . Cardiomyopathy Sister   . Congestive Heart Failure Brother     Social History Social History  Substance Use Topics  . Smoking status: Former Smoker    Packs/day: 1.00    Years: 40.00     Types: Cigarettes    Start date: 06/23/1956    Quit date: 06/23/1996  . Smokeless tobacco: Never Used  . Alcohol use 0.0 oz/week     Comment: socially     Allergies   Patient has no known allergies.   Review of Systems Review of Systems  Constitutional: Positive for fever.  Respiratory: Positive for cough.   Cardiovascular: Negative for chest pain and leg swelling.  Musculoskeletal: Positive for joint swelling and myalgias.  All other systems reviewed and are negative.    Physical Exam Triage Vital Signs ED Triage Vitals  Enc Vitals Group     BP 06/20/16 1600 (!) 144/90     Pulse Rate 06/20/16 1600 88  Resp 06/20/16 1600 16     Temp 06/20/16 1600 (!) 100.7 F (38.2 C)     Temp Source 06/20/16 1600 Oral     SpO2 06/20/16 1600 100 %     Weight 06/20/16 1559 165 lb (74.8 kg)     Height 06/20/16 1559 5\' 4"  (1.626 m)     Head Circumference --      Peak Flow --      Pain Score 06/20/16 1601 8     Pain Loc --      Pain Edu? --      Excl. in [place]? --    No data found.   Updated Vital Signs BP (!) 144/90 (BP Location: Right Arm)   Pulse 88   Temp (!) 100.7 F (38.2 C) (Oral)   Resp 16   Ht 5\' 4"  (1.626 m)   Wt 165 lb (74.8 kg)   SpO2 100%   BMI 28.32 kg/m   Visual Acuity Right Eye Distance:   Left Eye Distance:   Bilateral Distance:    Right Eye Near:   Left Eye Near:    Bilateral Near:     Physical Exam  Constitutional: She is oriented to person, place, and time. She appears well-developed and well-nourished.  HENT:  Head: Normocephalic and atraumatic.  Eyes: Pupils are equal, round, and reactive to light.  Neck: Normal range of motion. Neck supple. No tracheal deviation present. No thyromegaly present.  Cardiovascular: Normal rate and regular rhythm.   Murmur heard.  Systolic murmur is present with a grade of 4/6  Pulmonary/Chest: Effort normal and breath sounds normal.  Musculoskeletal: She exhibits tenderness. She exhibits no edema or deformity.        Left forearm: She exhibits tenderness and swelling.       Arms: She has tenderness over the medial epicondylitis elbow  Neurological: She is alert and oriented to person, place, and time. No cranial nerve deficit.  Skin: Skin is warm and dry.  Psychiatric: She has a normal mood and affect.  Vitals reviewed.    UC Treatments / Results  Labs (all labs ordered are listed, but only abnormal results are displayed) Labs Reviewed - No data to display  EKG  EKG Interpretation None       Radiology No results found.  Procedures Procedures (including critical care time)  Medications Ordered in UC Medications - No data to display   Initial Impression / Assessment and Plan / UC Course  I have reviewed the triage vital signs and the nursing notes.  Pertinent labs & imaging results that were available during my care of the patient were reviewed by me and considered in my medical decision making (see chart for details).  Clinical Course    #1 bronchitis. Explained to her normally we'll treat with antibiotics for bronchitis 110 days fever fever going to go ahead and treat her with Tussionex 1 teaspoon twice a day Zithromax 500 mg 1 tablet daily for 5 days and Allegra-D for the congestion. Follow-up PCP if not better  #2 left medial epicondylitis we'll recommend show strap or wrist band over the most tender part of the elbow to reduce the tendon from moving across the bone sprain to her house and that tendon moves carcinoma causes pain we'll place on Mobic 15 mg 1 tablet day follow-up PCP for either one these problems not better in 1-2 weeks. Work note given for next 2 days needed.   Final Clinical Impressions(s) / UC Diagnoses  Final diagnoses:  Acute bronchitis, unspecified organism  Cough  Medial epicondylitis of elbow, left    New Prescriptions Discharge Medication List as of 06/20/2016  5:23 PM    START taking these medications   Details  azithromycin (ZITHROMAX) 500  MG tablet 1 tablet a day for the next 5 days, Normal    chlorpheniramine-HYDROcodone (TUSSIONEX PENNKINETIC ER) 10-8 MG/5ML SUER Take 5 mLs by mouth every 12 (twelve) hours as needed for cough., Starting Fri 06/20/2016, Normal    fexofenadine-pseudoephedrine (ALLEGRA-D ALLERGY & CONGESTION) 180-240 MG 24 hr tablet Take 1 tablet by mouth daily., Starting Fri 06/20/2016, Normal    meloxicam (MOBIC) 15 MG tablet Take 1 tablet (15 mg total) by mouth daily., Starting Fri 06/20/2016, Normal        Note: This dictation was prepared with Dragon dictation along with smaller phrase technology. Any transcriptional errors that result from this process are unintentional.   Frederich Cha, MD 06/20/16 1733

## 2016-06-20 NOTE — ED Triage Notes (Signed)
Patient c/o cough, chest congestion, and HAs for the past 2 days.  Patient c/o left elbow pain since August.  Patient states that she has tendonitis in her left elbow in the past.

## 2016-06-25 ENCOUNTER — Other Ambulatory Visit: Payer: Self-pay | Admitting: Family Medicine

## 2016-06-25 DIAGNOSIS — G2581 Restless legs syndrome: Secondary | ICD-10-CM

## 2016-06-25 NOTE — Telephone Encounter (Signed)
Patient requesting refill of Maxzide to [facility].

## 2016-06-25 NOTE — Telephone Encounter (Signed)
Patient requesting refill of Coreg, Requip and Cozaar to [facility].

## 2016-07-03 ENCOUNTER — Encounter: Payer: Self-pay | Admitting: Family Medicine

## 2016-07-05 ENCOUNTER — Ambulatory Visit
Admission: EM | Admit: 2016-07-05 | Discharge: 2016-07-05 | Disposition: A | Discharge status: Home / Self Care | Payer: Medicare Other | Attending: Emergency Medicine | Admitting: Emergency Medicine

## 2016-07-05 ENCOUNTER — Encounter: Payer: Self-pay | Admitting: Gynecology

## 2016-07-05 ENCOUNTER — Ambulatory Visit: Payer: Medicare Other

## 2016-07-05 DIAGNOSIS — Z7982 Long term (current) use of aspirin: Secondary | ICD-10-CM | POA: Insufficient documentation

## 2016-07-05 DIAGNOSIS — K219 Gastro-esophageal reflux disease without esophagitis: Secondary | ICD-10-CM | POA: Insufficient documentation

## 2016-07-05 DIAGNOSIS — I700 Atherosclerosis of aorta: Secondary | ICD-10-CM | POA: Insufficient documentation

## 2016-07-05 DIAGNOSIS — J40 Bronchitis, not specified as acute or chronic: Secondary | ICD-10-CM | POA: Diagnosis not present

## 2016-07-05 DIAGNOSIS — Z7984 Long term (current) use of oral hypoglycemic drugs: Secondary | ICD-10-CM | POA: Insufficient documentation

## 2016-07-05 DIAGNOSIS — I10 Essential (primary) hypertension: Secondary | ICD-10-CM | POA: Insufficient documentation

## 2016-07-05 DIAGNOSIS — Z87891 Personal history of nicotine dependence: Secondary | ICD-10-CM | POA: Insufficient documentation

## 2016-07-05 DIAGNOSIS — R05 Cough: Secondary | ICD-10-CM | POA: Diagnosis not present

## 2016-07-05 MED ORDER — ONDANSETRON 8 MG PO TBDP: 8.0000 mg | ORAL_TABLET | Freq: Two times a day (BID) | ORAL | 0 refills | Status: DC

## 2016-07-05 MED ORDER — BENZONATATE 200 MG PO CAPS: 200.0000 mg | ORAL_CAPSULE | Freq: Three times a day (TID) | ORAL | 0 refills | Status: DC

## 2016-07-05 MED ORDER — PREDNISONE 20 MG PO TABS: ORAL_TABLET | ORAL | 0 refills | Status: DC

## 2016-07-05 NOTE — ED Triage Notes (Signed)
Per patient was seen on 12/29 for cough / congestion and is still not better.

## 2016-07-05 NOTE — ED Provider Notes (Signed)
CSN: [id number]     Arrival date & time 07/05/16  1015 History   First MD Initiated Contact with Patient 07/05/16 1135     Chief Complaint  Patient presents with  . Cough   (Consider location/radiation/quality/duration/timing/severity/associated sxs/prior Treatment) HPI  This 71 year old female who presents with cough congestion With complaints of no energy. The patient was seen here on 12/29 for cough and congestion given a prescription for azithromycin but did not seem to improve at all while taking it. He states that the  Tussionex did not seem to help control her cough. She is a former smoker; smoked for 14 years but stopped in 1998. In addition the patient has had nausea and vomiting when she eats any food. He was able to keep down fluids. However with further questioning she was able to eat a salad last night had no nausea following that she felt better after eating. She states that she has not had a bowel movement since December. He is passing gas and denies any abdominal pain. She states that she has lost about 10 pounds since her last visit here.      Past Medical History:  Diagnosis Date  . Bladder disorder   . GERD (gastroesophageal reflux disease)   . Hypertension    Past Surgical History:  Procedure Laterality Date  . ABDOMINAL HYSTERECTOMY  1976  . OOPHORECTOMY     Family History  Problem Relation Age of Onset  . Heart failure Father   . Arrhythmia Father   . Heart attack Mother   . Arrhythmia Sister   . Cardiomyopathy Sister   . Congestive Heart Failure Brother    Social History  Substance Use Topics  . Smoking status: Former Smoker    Packs/day: 1.00    Years: 40.00    Types: Cigarettes    Start date: 06/23/1956    Quit date: 06/23/1996  . Smokeless tobacco: Never Used  . Alcohol use 0.0 oz/week     Comment: socially   OB History    No data available     Review of Systems  Constitutional: Positive for activity change and appetite change. Negative for  chills, fatigue and fever.  HENT: Positive for congestion.   Respiratory: Positive for cough and shortness of breath. Negative for wheezing and stridor.   Gastrointestinal: Positive for constipation, nausea and vomiting. Negative for abdominal pain, blood in stool and diarrhea.  All other systems reviewed and are negative.   Allergies  Patient has no known allergies.  Home Medications   Prior to Admission medications   Medication Sig Start Date End Date Taking? Authorizing Provider  acetaminophen (TYLENOL) 500 MG tablet Take 1 tablet (500 mg total) by mouth every 6 (six) hours as needed for headache. 11/20/14  Yes Olen Cordial, NP  aspirin 81 MG chewable tablet Chew 1 tablet by mouth daily. 01/23/11  Yes Historical Provider, MD  atorvastatin (LIPITOR) 40 MG tablet Take 1 tablet by mouth  daily 01/23/16  Yes Steele Sizer, MD  diclofenac sodium (VOLTAREN) 1 % GEL Apply 4 g topically 4 (four) times daily. 02/20/16  Yes Steele Sizer, MD  ferrous sulfate 325 (65 FE) MG tablet Take 325 mg by mouth daily with breakfast.   Yes Historical Provider, MD  fexofenadine-pseudoephedrine (ALLEGRA-D ALLERGY & CONGESTION) 180-240 MG 24 hr tablet Take 1 tablet by mouth daily. 06/20/16  Yes Frederich Cha, MD  fluticasone (FLONASE) 50 MCG/ACT nasal spray USE 1 SPRAY IN EACH NOSTRIL TWO TIMES DAILY 05/08/16  Yes  Steele Sizer, MD  losartan (COZAAR) 50 MG tablet TAKE 1 TABLET BY MOUTH  DAILY 06/25/16  Yes Steele Sizer, MD  Magnesium Oxide 400 (240 MG) MG TABS Take 1 tablet by mouth daily. 03/15/13  Yes Historical Provider, MD  meloxicam (MOBIC) 15 MG tablet Take 1 tablet (15 mg total) by mouth daily. 06/20/16  Yes Frederich Cha, MD  metFORMIN (GLUCOPHAGE) 500 MG tablet Take 1 tablet by mouth two  times daily 01/23/16  Yes Steele Sizer, MD  Multiple Vitamin (MULTIVITAMIN) capsule Take 1 capsule by mouth daily.   Yes Historical Provider, MD  rOPINIRole (REQUIP) 2 MG tablet TAKE 1 TABLET BY MOUTH AT  BEDTIME 06/25/16   Yes Steele Sizer, MD  tiZANidine (ZANAFLEX) 2 MG tablet TAKE 1 TABLET BY MOUTH AT  BEDTIME 05/08/16  Yes Steele Sizer, MD  triamterene-hydrochlorothiazide (MAXZIDE-25) 37.5-25 MG tablet Take 0.5-1 tablets by mouth daily. 02/20/16  Yes Steele Sizer, MD  triamterene-hydrochlorothiazide (MAXZIDE-25) 37.5-25 MG tablet TAKE 1 TABLET BY MOUTH  DAILY 06/25/16  Yes Steele Sizer, MD  valACYclovir (VALTREX) 500 MG tablet TAKE 1 TABLET BY MOUTH 2  TIMES DAILY. PER EPISODE AS NEEDED, TAKE FOR 10 DAYS 05/25/16  Yes Steele Sizer, MD  VESICARE 10 MG tablet TAKE 1 TABLET BY MOUTH  DAILY 05/28/16  Yes Steele Sizer, MD  azithromycin (ZITHROMAX) 500 MG tablet 1 tablet a day for the next 5 days 06/20/16   Frederich Cha, MD  benzonatate (TESSALON) 200 MG capsule Take 1 capsule (200 mg total) by mouth every 8 (eight) hours. 07/05/16   Lorin Picket, PA-C  carvedilol (COREG) 6.25 MG tablet TAKE 1 TABLET BY MOUTH TWO  TIMES DAILY 06/25/16   Steele Sizer, MD  chlorpheniramine-HYDROcodone [facility] PENNKINETIC ER) 10-8 MG/5ML SUER Take 5 mLs by mouth every 12 (twelve) hours as needed for cough. 06/20/16   Frederich Cha, MD  ondansetron (ZOFRAN ODT) 8 MG disintegrating tablet Take 1 tablet (8 mg total) by mouth 2 (two) times daily. 07/05/16   Lorin Picket, PA-C  predniSONE (DELTASONE) 20 MG tablet Take 2 tablets (40 mg) daily by mouth 07/05/16   Lorin Picket, PA-C   Meds Ordered and Administered this Visit  Medications - No data to display  BP 105/76 (BP Location: Left Arm)   Pulse 98   Temp 98.5 F (36.9 C) (Oral)   Resp 16   Wt 165 lb (74.8 kg)   SpO2 99%   BMI 28.32 kg/m  No data found.   Physical Exam  Constitutional: She is oriented to person, place, and time. She appears well-developed and well-nourished. No distress.  HENT:  Head: Normocephalic and atraumatic.  Right Ear: External ear normal.  Left Ear: External ear normal.  Nose: Nose normal.  Mouth/Throat: Oropharynx is clear and moist.  No oropharyngeal exudate.  Eyes: EOM are normal. Pupils are equal, round, and reactive to light. Right eye exhibits no discharge. Left eye exhibits no discharge.  Neck: Normal range of motion. Neck supple.  Pulmonary/Chest: Effort normal and breath sounds normal. No respiratory distress. She has no rales.  Abdominal: Soft. Bowel sounds are normal. She exhibits no distension and no mass. There is no tenderness. There is no rebound and no guarding.  Musculoskeletal: Normal range of motion.  Lymphadenopathy:    She has no cervical adenopathy.  Neurological: She is alert and oriented to person, place, and time.  Skin: Skin is warm and dry. She is not diaphoretic.  Psychiatric: She has a normal mood and affect.  Her behavior is normal. Judgment and thought content normal.  Nursing note and vitals reviewed.   Urgent Care Course   Clinical Course     Procedures (including critical care time)  Labs Review Labs Reviewed - No data to display  Imaging Review Dg Chest 2 View  Result Date: 07/05/2016 CLINICAL DATA:  Cough EXAM: CHEST - 2 VIEW COMPARISON:  09/04/2014 FINDINGS: Lungs are clear. Heart size and mediastinal contours are within normal limits. Tortuous atheromatous aorta. No effusion. Visualized bones unremarkable. IMPRESSION: No acute cardiopulmonary disease. Electronically Signed   By: Lucrezia Europe M.D.   On: 07/05/2016 13:04     Visual Acuity Review  Right Eye Distance:   Left Eye Distance:   Bilateral Distance:    Right Eye Near:   Left Eye Near:    Bilateral Near:         MDM   1. Bronchitis    Discharge Medication List as of 07/05/2016  1:37 PM    START taking these medications   Details  benzonatate (TESSALON) 200 MG capsule Take 1 capsule (200 mg total) by mouth every 8 (eight) hours., Starting Sat 07/05/2016, Normal    ondansetron (ZOFRAN ODT) 8 MG disintegrating tablet Take 1 tablet (8 mg total) by mouth 2 (two) times daily., Starting Sat 07/05/2016, Normal     predniSONE (DELTASONE) 20 MG tablet Take 2 tablets (40 mg) daily by mouth, Normal      Plan: 1. Test/x-ray results and diagnosis reviewed with patient 2. rx as per orders; risks, benefits, potential side effects reviewed with patient 3. Recommend supportive treatment with Fluids and rest as necessary. Need to follow-up with her primary care regarding her weight loss and loss of appetite nausea vomiting next week. Her coughing seems to be bronchitis that is viral as she did not respond to the Z-Pak. I will provide Her short course of prednisone to help with the coughing along with Tessalon Perles. Is given her some Zofran for nausea so that she may eat although it seems that she was able to eat last night and hopefully may have improved. Doesn't need to have follow-up with her primary care which she will arrange on Monday 4. F/u prn if symptoms worsen or don't improve     Lorin Picket, PA-C 07/05/16 [place], [place] 07/05/16 1842

## 2016-08-20 ENCOUNTER — Ambulatory Visit (INDEPENDENT_AMBULATORY_CARE_PROVIDER_SITE_OTHER): Payer: Medicare Other | Admitting: Family Medicine

## 2016-08-20 ENCOUNTER — Encounter: Payer: Self-pay | Admitting: Family Medicine

## 2016-08-20 VITALS — BP 118/58 | HR 97 | Temp 98.7°F | Resp 16 | Ht 64.0 in | Wt 158.5 lb

## 2016-08-20 DIAGNOSIS — R350 Frequency of micturition: Secondary | ICD-10-CM | POA: Diagnosis not present

## 2016-08-20 DIAGNOSIS — G8929 Other chronic pain: Secondary | ICD-10-CM

## 2016-08-20 DIAGNOSIS — I10 Essential (primary) hypertension: Secondary | ICD-10-CM | POA: Diagnosis not present

## 2016-08-20 DIAGNOSIS — R634 Abnormal weight loss: Secondary | ICD-10-CM

## 2016-08-20 DIAGNOSIS — G2581 Restless legs syndrome: Secondary | ICD-10-CM | POA: Diagnosis not present

## 2016-08-20 DIAGNOSIS — E8881 Metabolic syndrome: Secondary | ICD-10-CM | POA: Diagnosis not present

## 2016-08-20 DIAGNOSIS — M7552 Bursitis of left shoulder: Secondary | ICD-10-CM

## 2016-08-20 DIAGNOSIS — E785 Hyperlipidemia, unspecified: Secondary | ICD-10-CM | POA: Diagnosis not present

## 2016-08-20 DIAGNOSIS — M545 Low back pain: Secondary | ICD-10-CM | POA: Diagnosis not present

## 2016-08-20 DIAGNOSIS — R739 Hyperglycemia, unspecified: Secondary | ICD-10-CM | POA: Diagnosis not present

## 2016-08-20 DIAGNOSIS — Z862 Personal history of diseases of the blood and blood-forming organs and certain disorders involving the immune mechanism: Secondary | ICD-10-CM

## 2016-08-20 MED ORDER — METFORMIN HCL 500 MG PO TABS: 500.0000 mg | ORAL_TABLET | Freq: Two times a day (BID) | ORAL | 1 refills | Status: DC

## 2016-08-20 MED ORDER — AMLODIPINE BESYLATE-VALSARTAN 5-160 MG PO TABS: 1.0000 | ORAL_TABLET | Freq: Every day | ORAL | 0 refills | Status: DC

## 2016-08-20 MED ORDER — LIDOCAINE HCL (PF) 1 % IJ SOLN
2.0000 mL | Freq: Once | INTRAMUSCULAR | Status: AC
Start: 2016-08-20 — End: 2016-08-20
  Administered 2016-08-20: 2 mL

## 2016-08-20 MED ORDER — ROPINIROLE HCL 2 MG PO TABS: 2.0000 mg | ORAL_TABLET | Freq: Every day | ORAL | 0 refills | Status: DC

## 2016-08-20 MED ORDER — ATORVASTATIN CALCIUM 40 MG PO TABS: 40.0000 mg | ORAL_TABLET | Freq: Every day | ORAL | 1 refills | Status: DC

## 2016-08-20 MED ORDER — TRIAMCINOLONE ACETONIDE 10 MG/ML IJ SUSP
40.0000 mg | Freq: Once | INTRAMUSCULAR | Status: AC
  Administered 2016-08-20: 40 mg

## 2016-08-20 MED ORDER — CARVEDILOL 12.5 MG PO TABS: 12.5000 mg | ORAL_TABLET | Freq: Two times a day (BID) | ORAL | 0 refills | Status: DC

## 2016-08-20 NOTE — Progress Notes (Signed)
Name: Alyssa Lawson   MRN: [id number]    DOB: [birth year]   Date:08/20/2016       Progress Note  Subjective  Chief Complaint  Chief Complaint  Patient presents with  . Hypertension    6 month follow up  . Hyperlipidemia    HPI  HTN: taking medications, bp is under control, no chest pain, no palpitation . She has urinary frequency and discussed stopping HCTZ and she is willing to try something else today.   Hyperlipidemia: last labs done last December 2016  and she is no on Atorvastatin. Denies side effects, no myalgias  HDL is very good  GERD: she is off Omeprazole, occasionally has heartburn and takes Tums or Zantac prn .   Metabolic Syndrome: last Q000111Q was 6.4 % , but was down to 6.1%  she denies polyphagia or polydipsia, she has urinary frequency . She is taking Metformin and denies side effects  Urinary incontinence: she has been on Ditropan for many years, but symptoms are getting worse, she can turn on the faucet because it makes her want to void. We will stopt  diuretic and continue Vesicare - per patient better than ditropan   RLS: better with medication, sleeping well with medication,- Requip -very seldom has symptoms.   Chronic Back Pain: she has daily low back pain, describes a dull pain , 1/10 no radiation. No weakens or tingling. She takes Tylenol prn and also Tizanidine  prn   Left shoulder pain: she has noticed a dull ache on left lateral shoulder for the past few weeks, she states worse when sleeping and has to shift positions, no pain with ROM, sometimes radiates down to left elbow  Bronchitis: she was very sick from Dec till Feb. She went to Urgent Care twice, was given Z-pack , cough medication and on the second visit prednisone. She has lack of appetite, fatigue and lost weight, but is back to normal now, and is eating again  Patient Active Problem List   Diagnosis Date Noted  . Chronic bilateral low back pain without sciatica 08/20/2016  . Metabolic  syndrome Q000111Q  . Mitral regurgitation 06/22/2015  . Melasma 12/21/2014  . History of hysterectomy 12/21/2014  . Restless leg syndrome 12/21/2014  . Benign essential HTN 12/17/2014  . Cataract 12/17/2014  . Dyslipidemia 12/17/2014  . Gastric reflux 12/17/2014  . History of cervical cancer 12/17/2014  . H/O iron deficiency anemia 12/17/2014  . Urinary incontinence in female 12/17/2014  . Dysmetabolic syndrome [id number]  . TI (tricuspid incompetence) 12/17/2014  . Osteopenia of the elderly 12/17/2014    Past Surgical History:  Procedure Laterality Date  . ABDOMINAL HYSTERECTOMY  1976  . OOPHORECTOMY      Family History  Problem Relation Age of Onset  . Heart failure Father   . Arrhythmia Father   . Heart attack Mother   . Arrhythmia Sister   . Cardiomyopathy Sister   . Congestive Heart Failure Brother     Social History   Social History  . Marital status: Married    Spouse name: N/A  . Number of children: N/A  . Years of education: N/A   Occupational History  . Not on file.   Social History Main Topics  . Smoking status: Former Smoker    Packs/day: 1.00    Years: 40.00    Types: Cigarettes    Start date: 06/23/1956    Quit date: 06/23/1996  . Smokeless tobacco: Never Used  . Alcohol use 0.0 oz/week  Comment: socially  . Drug use: No  . Sexual activity: Yes    Partners: Male   Other Topics Concern  . Not on file   Social History Narrative  . No narrative on file     Current Outpatient Prescriptions:  .  acetaminophen (TYLENOL) 500 MG tablet, Take 1 tablet (500 mg total) by mouth every 6 (six) hours as needed for headache., Disp: 30 tablet, Rfl: 0 .  amLODipine-valsartan (EXFORGE) 5-160 MG tablet, Take 1 tablet by mouth daily., Disp: 90 tablet, Rfl: 0 .  aspirin 81 MG chewable tablet, Chew 1 tablet by mouth daily., Disp: , Rfl:  .  atorvastatin (LIPITOR) 40 MG tablet, Take 1 tablet (40 mg total) by mouth daily., Disp: 90 tablet, Rfl: 1 .   carvedilol (COREG) 12.5 MG tablet, Take 1 tablet (12.5 mg total) by mouth 2 (two) times daily., Disp: 180 tablet, Rfl: 0 .  ferrous sulfate 325 (65 FE) MG tablet, Take 325 mg by mouth daily with breakfast., Disp: , Rfl:  .  fluticasone (FLONASE) 50 MCG/ACT nasal spray, USE 1 SPRAY IN EACH NOSTRIL TWO TIMES DAILY, Disp: 48 g, Rfl: 1 .  Magnesium Oxide 400 (240 MG) MG TABS, Take 1 tablet by mouth daily., Disp: , Rfl:  .  meloxicam (MOBIC) 15 MG tablet, Take 1 tablet (15 mg total) by mouth daily., Disp: 30 tablet, Rfl: 0 .  metFORMIN (GLUCOPHAGE) 500 MG tablet, Take 1 tablet (500 mg total) by mouth 2 (two) times daily., Disp: 180 tablet, Rfl: 1 .  Multiple Vitamin (MULTIVITAMIN) capsule, Take 1 capsule by mouth daily., Disp: , Rfl:  .  rOPINIRole (REQUIP) 2 MG tablet, Take 1 tablet (2 mg total) by mouth at bedtime., Disp: 90 tablet, Rfl: 0 .  tiZANidine (ZANAFLEX) 2 MG tablet, TAKE 1 TABLET BY MOUTH AT  BEDTIME, Disp: 90 tablet, Rfl: 0 .  triamterene-hydrochlorothiazide (MAXZIDE-25) 37.5-25 MG tablet, TAKE 1 TABLET BY MOUTH  DAILY, Disp: 90 tablet, Rfl: 0 .  valACYclovir (VALTREX) 500 MG tablet, TAKE 1 TABLET BY MOUTH 2  TIMES DAILY. PER EPISODE AS NEEDED, TAKE FOR 10 DAYS, Disp: 90 tablet, Rfl: 0 .  VESICARE 10 MG tablet, TAKE 1 TABLET BY MOUTH  DAILY, Disp: 30 tablet, Rfl: 5  No Known Allergies   ROS  Constitutional: Negative for fever, positive for weight change.  Respiratory: Negative for cough and shortness of breath.   Cardiovascular: Negative for chest pain or palpitations.  Gastrointestinal: Negative for abdominal pain, no bowel changes.  Musculoskeletal: Negative for gait problem or joint swelling.  Skin: Negative for rash.  Neurological: Negative for dizziness or headache.  No other specific complaints in a complete review of systems (except as listed in HPI above).  Objective  Vitals:   08/20/16 0824  BP: (!) 118/58  Pulse: 97  Resp: 16  Temp: 98.7 F (37.1 C)  SpO2: 93%   Weight: 152 lb 7 oz (69.1 kg)  Height: 5\' 4"  (1.626 m)    Body mass index is 26.17 kg/m.  Physical Exam  Constitutional: Patient appears well-developed and well-nourished. Obese  No distress.  HEENT: head atraumatic, normocephalic, pupils equal and reactive to light,  neck supple, throat within normal limits Cardiovascular: Normal rate, regular rhythm and normal heart sounds.  No murmur heard. No BLE edema. Pulmonary/Chest: Effort normal and breath sounds normal. No respiratory distress. Abdominal: Soft.  There is no tenderness. Psychiatric: Patient has a normal mood and affect. behavior is normal. Judgment and thought content normal. Muscular  Skeletal: no pain during palpation of lumbar spine, negative straight leg raise, pain during palpation of left deltoid bursa, normal rom of left shoulder  PHQ2/9: Depression screen [facility] 2/9 08/20/2016 02/20/2016 06/22/2015 12/21/2014  Decreased Interest 0 0 0 0  Down, Depressed, Hopeless 0 0 0 0  PHQ - 2 Score 0 0 0 0    Fall Risk: Fall Risk  08/20/2016 02/20/2016 06/22/2015 12/21/2014  Falls in the past year? No Yes No No  Number falls in past yr: - 1 - -  Injury with Fall? - No - -     Functional Status Survey: Is the patient deaf or have difficulty hearing?: No Does the patient have difficulty seeing, even when wearing glasses/contacts?: No Does the patient have difficulty concentrating, remembering, or making decisions?: No Does the patient have difficulty walking or climbing stairs?: No Does the patient have difficulty dressing or bathing?: No Does the patient have difficulty doing errands alone such as visiting a doctor's office or shopping?: No    Assessment & Plan  1. Benign essential HTN  She takes diuretics and has urinary incontinence, we will try changing medication and see if bladder problems improves - carvedilol (COREG) 12.5 MG tablet; Take 1 tablet (12.5 mg total) by mouth 2 (two) times daily.  Dispense: 180 tablet;  Refill: 0 - amLODipine-valsartan (EXFORGE) 5-160 MG tablet; Take 1 tablet by mouth daily.  Dispense: 90 tablet; Refill: 0 - CBC with Differential/Platelet - COMPLETE METABOLIC PANEL WITH GFR  2. Dyslipidemia  - atorvastatin (LIPITOR) 40 MG tablet; Take 1 tablet (40 mg total) by mouth daily.  Dispense: 90 tablet; Refill: 1 - Lipid panel  3. Hyperglycemia  - metFORMIN (GLUCOPHAGE) 500 MG tablet; Take 1 tablet (500 mg total) by mouth 2 (two) times daily.  Dispense: 180 tablet; Refill: 1 - Hemoglobin A1c - Insulin, fasting  4. Dysmetabolic syndrome  - metFORMIN (GLUCOPHAGE) 500 MG tablet; Take 1 tablet (500 mg total) by mouth 2 (two) times daily.  Dispense: 180 tablet; Refill: 1  5. History of anemia  - CBC with Differential/Platelet  6. RLS (restless legs syndrome)  - rOPINIRole (REQUIP) 2 MG tablet; Take 1 tablet (2 mg total) by mouth at bedtime.  Dispense: 90 tablet; Refill: 0  7. Acute bursitis of left shoulder  - triamcinolone acetonide (KENALOG) 10 MG/ML injection 40 mg; 4 mLs (40 mg total) by Other route once. - lidocaine (PF) (XYLOCAINE) 1 % injection 2 mL; 2 mLs by Other route once.  Consent form signed Localized bursa  Area prepped with alcohol  Injection with lidocaine 1% and Kenalog 40mg /1 ml on bursa sack Patient tolerated procedure well No side effects  8. Chronic bilateral low back pain without sciatica   9. Urinary frequency  Taking half dose of Vesicare because of cost, symptoms daily , we will stop diurectic   10. Weight loss  Likely from recent illness - TSH

## 2016-08-29 DIAGNOSIS — E785 Hyperlipidemia, unspecified: Secondary | ICD-10-CM | POA: Diagnosis not present

## 2016-08-29 DIAGNOSIS — I10 Essential (primary) hypertension: Secondary | ICD-10-CM | POA: Diagnosis not present

## 2016-08-29 DIAGNOSIS — Z862 Personal history of diseases of the blood and blood-forming organs and certain disorders involving the immune mechanism: Secondary | ICD-10-CM | POA: Diagnosis not present

## 2016-08-29 DIAGNOSIS — R634 Abnormal weight loss: Secondary | ICD-10-CM | POA: Diagnosis not present

## 2016-08-29 DIAGNOSIS — R739 Hyperglycemia, unspecified: Secondary | ICD-10-CM | POA: Diagnosis not present

## 2016-08-29 LAB — CBC WITH DIFFERENTIAL/PLATELET
BASOS PCT: 0 %
Basophils Absolute: 0 cells/uL (ref 0–200)
Eosinophils Absolute: 142 cells/uL (ref 15–500)
Eosinophils Relative: 2 %
HCT: 40.2 % (ref 35.0–45.0)
Hemoglobin: 13.3 g/dL (ref 11.7–15.5)
LYMPHS PCT: 20 %
Lymphs Abs: 1420 cells/uL (ref 850–3900)
MCH: 30.5 pg (ref 27.0–33.0)
MCHC: 33.1 g/dL (ref 32.0–36.0)
MCV: 92.2 fL (ref 80.0–100.0)
MONOS PCT: 7 %
MPV: 11.3 fL (ref 7.5–12.5)
Monocytes Absolute: 497 cells/uL (ref 200–950)
NEUTROS PCT: 71 %
Neutro Abs: 5041 cells/uL (ref 1500–7800)
PLATELETS: 301 10*3/uL (ref 140–400)
RBC: 4.36 MIL/uL (ref 3.80–5.10)
RDW: 14 % (ref 11.0–15.0)
WBC: 7.1 10*3/uL (ref 3.8–10.8)

## 2016-08-30 LAB — HEMOGLOBIN A1C
HEMOGLOBIN A1C: 6.3 % — AB (ref <5.7)
MEAN PLASMA GLUCOSE: 134 mg/dL

## 2016-08-30 LAB — COMPLETE METABOLIC PANEL WITH GFR
ALT: 24 U/L (ref 6–29)
AST: 19 U/L (ref 10–35)
Albumin: 4.3 g/dL (ref 3.6–5.1)
Alkaline Phosphatase: 56 U/L (ref 33–130)
BUN: 25 mg/dL (ref 7–25)
CHLORIDE: 103 mmol/L (ref 98–110)
CO2: 25 mmol/L (ref 20–31)
CREATININE: 1.06 mg/dL — AB (ref 0.60–0.93)
Calcium: 9.7 mg/dL (ref 8.6–10.4)
GFR, Est African American: 61 mL/min (ref >=60)
GFR, Est Non African American: 53 mL/min — ABNORMAL LOW (ref >=60)
Glucose, Bld: 119 mg/dL — ABNORMAL HIGH (ref 65–99)
POTASSIUM: 4.6 mmol/L (ref 3.5–5.3)
Sodium: 138 mmol/L (ref 135–146)
Total Bilirubin: 0.4 mg/dL (ref 0.2–1.2)
Total Protein: 7 g/dL (ref 6.1–8.1)

## 2016-08-30 LAB — LIPID PANEL
CHOLESTEROL: 164 mg/dL (ref <200)
HDL: 81 mg/dL (ref >50)
LDL CALC: 69 mg/dL (ref <100)
TRIGLYCERIDES: 69 mg/dL (ref <150)
Total CHOL/HDL Ratio: 2 Ratio (ref <5.0)
VLDL: 14 mg/dL (ref <30)

## 2016-08-30 LAB — TSH: TSH: 1.46 mIU/L

## 2016-09-01 LAB — INSULIN, FASTING: INSULIN FASTING, SERUM: 10.8 u[IU]/mL (ref 2.0–19.6)

## 2016-09-05 ENCOUNTER — Other Ambulatory Visit: Payer: Self-pay | Admitting: Family Medicine

## 2016-09-05 DIAGNOSIS — E785 Hyperlipidemia, unspecified: Secondary | ICD-10-CM

## 2016-09-05 DIAGNOSIS — G2581 Restless legs syndrome: Secondary | ICD-10-CM

## 2016-09-05 NOTE — Telephone Encounter (Signed)
Patient requesting refill of Atorvastatin, Requip, Maxzide, Carvedilol and Losartan to [facility].

## 2016-09-18 ENCOUNTER — Encounter: Payer: Self-pay | Admitting: Family Medicine

## 2016-09-18 ENCOUNTER — Other Ambulatory Visit: Payer: Self-pay | Admitting: Family Medicine

## 2016-09-18 DIAGNOSIS — G2581 Restless legs syndrome: Secondary | ICD-10-CM

## 2016-09-18 DIAGNOSIS — E8881 Metabolic syndrome: Secondary | ICD-10-CM

## 2016-09-18 DIAGNOSIS — E785 Hyperlipidemia, unspecified: Secondary | ICD-10-CM

## 2016-09-18 DIAGNOSIS — R739 Hyperglycemia, unspecified: Secondary | ICD-10-CM

## 2016-09-18 DIAGNOSIS — I10 Essential (primary) hypertension: Secondary | ICD-10-CM

## 2016-09-18 MED ORDER — AMLODIPINE BESYLATE-VALSARTAN 5-160 MG PO TABS: 1.0000 | ORAL_TABLET | Freq: Every day | ORAL | 0 refills | Status: DC

## 2016-09-18 MED ORDER — ATORVASTATIN CALCIUM 40 MG PO TABS: 40.0000 mg | ORAL_TABLET | Freq: Every day | ORAL | 1 refills | Status: DC

## 2016-09-18 MED ORDER — METFORMIN HCL 500 MG PO TABS: 500.0000 mg | ORAL_TABLET | Freq: Two times a day (BID) | ORAL | 1 refills | Status: DC

## 2016-09-18 MED ORDER — CARVEDILOL 12.5 MG PO TABS: 12.5000 mg | ORAL_TABLET | Freq: Two times a day (BID) | ORAL | 0 refills | Status: DC

## 2016-09-18 MED ORDER — ROPINIROLE HCL 2 MG PO TABS: 2.0000 mg | ORAL_TABLET | Freq: Every day | ORAL | 0 refills | Status: DC

## 2016-09-30 ENCOUNTER — Other Ambulatory Visit: Payer: Self-pay | Admitting: Family Medicine

## 2016-09-30 ENCOUNTER — Encounter: Payer: Self-pay | Admitting: Family Medicine

## 2016-09-30 DIAGNOSIS — M25512 Pain in left shoulder: Secondary | ICD-10-CM

## 2016-09-30 DIAGNOSIS — I10 Essential (primary) hypertension: Secondary | ICD-10-CM

## 2016-09-30 MED ORDER — VALSARTAN 160 MG PO TABS: 160.0000 mg | ORAL_TABLET | Freq: Every day | ORAL | 1 refills | Status: DC

## 2016-09-30 MED ORDER — AMLODIPINE BESYLATE 5 MG PO TABS: 5.0000 mg | ORAL_TABLET | Freq: Every day | ORAL | 1 refills | Status: DC

## 2016-10-09 DIAGNOSIS — M7542 Impingement syndrome of left shoulder: Secondary | ICD-10-CM | POA: Diagnosis not present

## 2016-10-13 ENCOUNTER — Encounter: Payer: Self-pay | Admitting: Family Medicine

## 2016-10-13 DIAGNOSIS — M7542 Impingement syndrome of left shoulder: Secondary | ICD-10-CM | POA: Insufficient documentation

## 2016-10-28 ENCOUNTER — Encounter: Payer: Self-pay | Admitting: Family Medicine

## 2016-10-29 ENCOUNTER — Encounter: Payer: Self-pay | Admitting: Family Medicine

## 2016-11-22 LAB — HM DIABETES EYE EXAM

## 2016-11-25 ENCOUNTER — Encounter: Payer: Self-pay | Admitting: Family Medicine

## 2016-11-25 DIAGNOSIS — H269 Unspecified cataract: Secondary | ICD-10-CM | POA: Insufficient documentation

## 2016-11-26 ENCOUNTER — Encounter: Payer: Self-pay | Admitting: Family Medicine

## 2016-12-25 ENCOUNTER — Other Ambulatory Visit: Payer: Self-pay | Admitting: Family Medicine

## 2016-12-25 NOTE — Telephone Encounter (Signed)
Patient requesting refill of Losartan to [facility].

## 2016-12-31 ENCOUNTER — Other Ambulatory Visit: Payer: Self-pay | Admitting: Family Medicine

## 2016-12-31 ENCOUNTER — Telehealth: Payer: Self-pay

## 2016-12-31 MED ORDER — AMLODIPINE BESYLATE 2.5 MG PO TABS: 2.5000 mg | ORAL_TABLET | Freq: Every day | ORAL | 0 refills | Status: DC

## 2016-12-31 MED ORDER — VALSARTAN 160 MG PO TABS: 160.0000 mg | ORAL_TABLET | Freq: Every day | ORAL | 0 refills | Status: DC

## 2016-12-31 NOTE — Telephone Encounter (Signed)
Appointment made for next Wednesday 01/07/2017

## 2016-12-31 NOTE — Telephone Encounter (Signed)
Pharmacy called for clarification on medication about Losartan and Diovan. Dr. Ancil Boozer stated Losartan needed to be D/C and patient needs to be on Amlodipine 2.5 mg once daily and Diovan 160 mg once daily. Also please schedule patient medication refill follow up appointment. Thanks.

## 2017-01-07 ENCOUNTER — Ambulatory Visit (INDEPENDENT_AMBULATORY_CARE_PROVIDER_SITE_OTHER): Payer: Medicare Other | Admitting: Family Medicine

## 2017-01-07 ENCOUNTER — Encounter: Payer: Self-pay | Admitting: Family Medicine

## 2017-01-07 VITALS — BP 128/74 | HR 90 | Temp 98.3°F | Resp 18 | Ht 64.0 in | Wt 172.2 lb

## 2017-01-07 DIAGNOSIS — R350 Frequency of micturition: Secondary | ICD-10-CM | POA: Diagnosis not present

## 2017-01-07 DIAGNOSIS — E8881 Metabolic syndrome: Secondary | ICD-10-CM | POA: Diagnosis not present

## 2017-01-07 DIAGNOSIS — G2581 Restless legs syndrome: Secondary | ICD-10-CM

## 2017-01-07 DIAGNOSIS — M25541 Pain in joints of right hand: Secondary | ICD-10-CM | POA: Diagnosis not present

## 2017-01-07 DIAGNOSIS — R739 Hyperglycemia, unspecified: Secondary | ICD-10-CM

## 2017-01-07 DIAGNOSIS — E785 Hyperlipidemia, unspecified: Secondary | ICD-10-CM

## 2017-01-07 DIAGNOSIS — R05 Cough: Secondary | ICD-10-CM | POA: Diagnosis not present

## 2017-01-07 DIAGNOSIS — R062 Wheezing: Secondary | ICD-10-CM | POA: Diagnosis not present

## 2017-01-07 DIAGNOSIS — R635 Abnormal weight gain: Secondary | ICD-10-CM | POA: Diagnosis not present

## 2017-01-07 DIAGNOSIS — M25542 Pain in joints of left hand: Secondary | ICD-10-CM | POA: Diagnosis not present

## 2017-01-07 DIAGNOSIS — I10 Essential (primary) hypertension: Secondary | ICD-10-CM | POA: Diagnosis not present

## 2017-01-07 DIAGNOSIS — R059 Cough, unspecified: Secondary | ICD-10-CM

## 2017-01-07 MED ORDER — ROPINIROLE HCL 2 MG PO TABS: 2.0000 mg | ORAL_TABLET | Freq: Every day | ORAL | 1 refills | Status: DC

## 2017-01-07 MED ORDER — BENZONATATE 100 MG PO CAPS: 100.0000 mg | ORAL_CAPSULE | Freq: Two times a day (BID) | ORAL | 0 refills | Status: DC | PRN

## 2017-01-07 MED ORDER — DULAGLUTIDE 1.5 MG/0.5ML ~~LOC~~ SOAJ: 1.5000 mg | SUBCUTANEOUS | 1 refills | Status: DC

## 2017-01-07 MED ORDER — ATORVASTATIN CALCIUM 40 MG PO TABS: 40.0000 mg | ORAL_TABLET | Freq: Every day | ORAL | 1 refills | Status: DC

## 2017-01-07 MED ORDER — OLMESARTAN MEDOXOMIL 20 MG PO TABS: 20.0000 mg | ORAL_TABLET | Freq: Every day | ORAL | 1 refills | Status: DC

## 2017-01-07 MED ORDER — CARVEDILOL 12.5 MG PO TABS: 12.5000 mg | ORAL_TABLET | Freq: Two times a day (BID) | ORAL | 1 refills | Status: DC

## 2017-01-07 MED ORDER — FLUTICASONE FUROATE-VILANTEROL 100-25 MCG/INH IN AEPB: 1.0000 | INHALATION_SPRAY | Freq: Every day | RESPIRATORY_TRACT | 0 refills | Status: DC

## 2017-01-07 MED ORDER — OXYBUTYNIN CHLORIDE ER 5 MG PO TB24: 5.0000 mg | ORAL_TABLET | Freq: Every day | ORAL | 0 refills | Status: DC

## 2017-01-07 NOTE — Progress Notes (Signed)
Name: Alyssa Lawson   MRN: [id number]    DOB: August 04, 1945   Date:01/07/2017       Progress Note  Subjective  Chief Complaint  Chief Complaint  Patient presents with  . Hypertension    discuss medications per your request  . Wheezing    pt stated that she has a bad cough and wheezing since beginning of July which is not getting any better     HPI  HTN: taking medications, bp is under control, no chest pain, no palpitation . She is off diuretic, advised to stop Valsartan because it was recalled and try benicar, continue coreg.   Hyperlipidemia: last labs done last March 2018 and she is no on Atorvastatin. Denies side effects, no myalgias HDL is very good and LDL at goal  Metabolic Syndrome: last PFXT0W was 6.3% she denies polyphagia or polydipsia, she has urinary frequency and has gained a lot of weight since last visit. She is taking Metformin and only side effects is easy bruising. Discussed GLP-1 agonist, she is wiling to try Trulicity, [id number] going up on Metformin and she has been gaining weight. No family history of thyroid cancer or MEN or personal history of pancreatitis, discussed possible side effects.   Urinary incontinence: she has been on Ditropan for many years, but symptoms got worse, so we switched to [facility] however she states too expensive and would like to go back to [facility], discussed risk on elderly patients , but she would like to try.   RLS: better with medication, sleeping well with medication,- Requip -very seldom has symptoms.   Chronic Back Pain: she has daily low back pain, describes a dull pain , 1/10no radiation. No weakens or tingling. She takes Tylenol prn and also Tizanidine prn She left her job back in May 2018 - and is doing better  Wheezing: she had bronchitis from Dec till Feb, and has noticed nasal congestion and facial pressure for the past 2 weeks, and over the past day other symptoms improved, but she is now wheezing and has a dry cough with  only occasional sputum, no fever or chills, she has some SOB that is worse at night, she used to be a smoker but quit about 20 years ago. She has tried Tylenol sinus and benadryl   Patient Active Problem List   Diagnosis Date Noted  . Cataracts, bilateral 11/25/2016  . Impingement syndrome of shoulder, left 10/13/2016  . Chronic bilateral low back pain without sciatica 08/20/2016  . Metabolic syndrome 32/99/2426  . Mitral regurgitation 06/22/2015  . Melasma 12/21/2014  . History of hysterectomy 12/21/2014  . Restless leg syndrome 12/21/2014  . Benign essential HTN 12/17/2014  . Cataract 12/17/2014  . Dyslipidemia 12/17/2014  . Gastric reflux 12/17/2014  . History of cervical cancer 12/17/2014  . H/O iron deficiency anemia 12/17/2014  . Urinary incontinence in female 12/17/2014  . Dysmetabolic syndrome 83/41/9622  . TI (tricuspid incompetence) 12/17/2014  . Osteopenia of the elderly 12/17/2014    Past Surgical History:  Procedure Laterality Date  . ABDOMINAL HYSTERECTOMY  1976  . OOPHORECTOMY      Family History  Problem Relation Age of Onset  . Heart failure Father   . Arrhythmia Father   . Heart attack Mother   . Arrhythmia Sister   . Cardiomyopathy Sister   . Congestive Heart Failure Brother     Social History   Social History  . Marital status: Married    Spouse name: N/A  . Number of  children: N/A  . Years of education: N/A   Occupational History  . Not on file.   Social History Main Topics  . Smoking status: Former Smoker    Packs/day: 1.00    Years: 30.00    Types: Cigarettes    Start date: 06/23/1956    Quit date: 11/22/1995  . Smokeless tobacco: Never Used  . Alcohol use 0.0 oz/week     Comment: socially  . Drug use: No  . Sexual activity: Yes    Partners: Male   Other Topics Concern  . 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  acetaminophen (TYLENOL) 500 MG tablet, Take 1 tablet (500 mg total) by mouth every 6 (six) hours as needed for headache., Disp: 30 tablet, Rfl: 0 .  aspirin 81 MG chewable tablet, Chew 1 tablet by mouth daily., Disp: , Rfl:  .  atorvastatin (LIPITOR) 40 MG tablet, Take 1 tablet (40 mg total) by mouth daily., Disp: 90 tablet, Rfl: 1 .  benzonatate (TESSALON) 100 MG capsule, Take 1-2 capsules (100-200 mg total) by mouth 2 (two) times daily as needed., Disp: 40 capsule, Rfl: 0 .  carvedilol (COREG) 12.5 MG tablet, Take 1 tablet (12.5 mg total) by mouth 2 (two) times daily., Disp: 180 tablet, Rfl: 1 .  Dulaglutide (TRULICITY) 1.5 QM/5.7QI SOPN, Inject 1.5 mg into the skin once a week., Disp: 12 pen, Rfl: 1 .  ferrous sulfate 325 (65 FE) MG tablet, Take 325 mg by mouth daily with breakfast., Disp: , Rfl:  .  fluticasone (FLONASE) 50 MCG/ACT nasal spray, USE 1 SPRAY IN EACH NOSTRIL TWO TIMES DAILY, Disp: 48 g, Rfl: 1 .  fluticasone furoate-vilanterol (BREO ELLIPTA) 100-25 MCG/INH AEPB, Inhale 1 puff into the lungs daily., Disp: 60 each, Rfl: 0 .  Magnesium Oxide 400 (240 MG) MG TABS, Take 1 tablet by mouth daily., Disp: , Rfl:  .  Multiple Vitamin (MULTIVITAMIN) capsule, Take 1 capsule by mouth daily., Disp: , Rfl:  .  olmesartan (BENICAR) 20 MG tablet, Take 1 tablet (20 mg total) by mouth daily., Disp: 90 tablet, Rfl: 1 .  oxybutynin (DITROPAN-XL) 5 MG 24 hr tablet, Take 1 tablet (5 mg total) by mouth at bedtime. In place of vesicare, Disp: 90 tablet, Rfl: 0 .  rOPINIRole (REQUIP) 2 MG tablet, Take 1 tablet (2 mg total) by mouth at bedtime., Disp: 90 tablet, Rfl: 1 .  tiZANidine (ZANAFLEX) 2 MG tablet, TAKE 1 TABLET BY MOUTH AT  BEDTIME, Disp: 90 tablet, Rfl: 0 .  valACYclovir (VALTREX) 500 MG tablet, TAKE 1 TABLET BY MOUTH 2  TIMES DAILY. PER EPISODE AS NEEDED, TAKE FOR 10 DAYS, Disp: 90 tablet, Rfl: 0  Allergies   Allergen Reactions  . Amlodipine Swelling     ROS  Constitutional: Negative for fever or weight change.  Respiratory:Positive for cough and shortness of breath.   Cardiovascular: Negative for chest pain or palpitations.  Gastrointestinal: Negative for abdominal pain, no bowel changes.  Musculoskeletal: Negative for gait problem positive for finger  joint swelling.  Skin: Negative for rash.  Neurological: Negative for dizziness or headache.  No other specific complaints in a complete review of systems (except as listed in HPI above).  Objective  Vitals:   01/07/17 1425  BP: 128/74  Pulse: 90  Resp: 18  Temp: 98.3 F (36.8 C)  SpO2: 94%  Weight: 172 lb 4 oz (78.1 kg)  Height: 5\' 4"  (1.626 m)    Body mass index is 29.57 kg/m.  Physical Exam  Constitutional: Patient appears well-developed and well-nourished. Overweight No distress.  HEENT: head atraumatic, normocephalic, pupils equal and reactive to light, , neck supple, throat within normal limits Cardiovascular: Normal rate, regular rhythm and normal heart sounds.  No murmur heard. No BLE edema. Pulmonary/Chest: Effort normal and bilateral scattered wheezing on exam. No respiratory distress. Abdominal: Soft.  There is no tenderness. Psychiatric: Patient has a normal mood and affect. behavior is normal. Judgment and thought content normal. Muscular Skeletal: no signs of synovitis, PIP a little larger than DIP but no deformities.    Recent Results (from the past 2160 hour(s))  [facility] DIABETES EYE EXAM     Status: None   Collection Time: 11/22/16 12:00 AM  Result Value Ref Range   [facility] Diabetic Eye Exam No Retinopathy No Retinopathy    Comment: bilateral cataracts  [facility] DIABETES EYE EXAM     Status: Abnormal   Collection Time: 11/22/16 12:00 AM  Result Value Ref Range   [facility] Diabetic Eye Exam Retinopathy (A) No Retinopathy    Comment: Mild-return yearly,[facility]      PHQ2/9: Depression screen [facility] 2/9  08/20/2016 02/20/2016 06/22/2015 12/21/2014  Decreased Interest 0 0 0 0  Down, Depressed, Hopeless 0 0 0 0  PHQ - 2 Score 0 0 0 0     Fall Risk: Fall Risk  08/20/2016 02/20/2016 06/22/2015 12/21/2014  Falls in the past year? No Yes No No  Number falls in past yr: - 1 - -  Injury with Fall? - No - -      Assessment & Plan   1. Benign essential HTN  - carvedilol (COREG) 12.5 MG tablet; Take 1 tablet (12.5 mg total) by mouth 2 (two) times daily.  Dispense: 180 tablet; Refill: 1 - olmesartan (BENICAR) 20 MG tablet; Take 1 tablet (20 mg total) by mouth daily.  Dispense: 90 tablet; Refill: 1 - COMPLETE METABOLIC PANEL WITH GFR - CBC with Differential/Platelet  2. Dyslipidemia  - atorvastatin (LIPITOR) 40 MG tablet; Take 1 tablet (40 mg total) by mouth daily.  Dispense: 90 tablet; Refill: 1  3. Dysmetabolic syndrome  - Dulaglutide (TRULICITY) 1.5 YY/5.0PT SOPN; Inject 1.5 mg into the skin once a week.  Dispense: 12 pen; Refill: 1 - Insulin, fasting - Hemoglobin A1c  4. RLS (restless legs syndrome)  - rOPINIRole (REQUIP) 2 MG tablet; Take 1 tablet (2 mg total) by mouth at bedtime.  Dispense: 90 tablet; Refill: 1  5. Cough  - CBC with Differential/Platelet - benzonatate (TESSALON) 100 MG capsule; Take 1-2 capsules (100-200 mg total) by mouth 2 (two) times daily as needed.  Dispense: 40 capsule; Refill: 0  6. Wheezing  Used to smoke, quit in 1997, having recurrent colds with wheezing and cough - CBC with Differential/Platelet  7. Urinary frequency  - oxybutynin (DITROPAN-XL) 5 MG 24 hr tablet; Take 1 tablet (5 mg total) by mouth at bedtime. In place of vesicare  Dispense: 90 tablet; Refill: 0  8. Arthralgia of both hands  - C-reactive protein - Sedimentation rate  9. Weight gain  - TSH  10. Hyperglycemia  - Insulin, fasting - Hemoglobin A1c

## 2017-02-25 ENCOUNTER — Encounter: Payer: Self-pay | Admitting: Family Medicine

## 2017-02-25 ENCOUNTER — Other Ambulatory Visit: Payer: Self-pay | Admitting: Family Medicine

## 2017-02-25 ENCOUNTER — Ambulatory Visit (INDEPENDENT_AMBULATORY_CARE_PROVIDER_SITE_OTHER): Payer: Medicare Other | Admitting: Family Medicine

## 2017-02-25 VITALS — BP 114/62 | HR 91 | Temp 98.5°F | Resp 16 | Ht 64.75 in | Wt 158.3 lb

## 2017-02-25 DIAGNOSIS — R9431 Abnormal electrocardiogram [ECG] [EKG]: Secondary | ICD-10-CM | POA: Diagnosis not present

## 2017-02-25 DIAGNOSIS — Z1231 Encounter for screening mammogram for malignant neoplasm of breast: Secondary | ICD-10-CM

## 2017-02-25 DIAGNOSIS — R635 Abnormal weight gain: Secondary | ICD-10-CM | POA: Diagnosis not present

## 2017-02-25 DIAGNOSIS — E8881 Metabolic syndrome: Secondary | ICD-10-CM | POA: Diagnosis not present

## 2017-02-25 DIAGNOSIS — R350 Frequency of micturition: Secondary | ICD-10-CM

## 2017-02-25 DIAGNOSIS — R05 Cough: Secondary | ICD-10-CM | POA: Diagnosis not present

## 2017-02-25 DIAGNOSIS — M25542 Pain in joints of left hand: Secondary | ICD-10-CM | POA: Diagnosis not present

## 2017-02-25 DIAGNOSIS — Z23 Encounter for immunization: Secondary | ICD-10-CM | POA: Diagnosis not present

## 2017-02-25 DIAGNOSIS — R062 Wheezing: Secondary | ICD-10-CM | POA: Diagnosis not present

## 2017-02-25 DIAGNOSIS — Z0000 Encounter for general adult medical examination without abnormal findings: Secondary | ICD-10-CM | POA: Diagnosis not present

## 2017-02-25 DIAGNOSIS — I10 Essential (primary) hypertension: Secondary | ICD-10-CM | POA: Diagnosis not present

## 2017-02-25 DIAGNOSIS — R739 Hyperglycemia, unspecified: Secondary | ICD-10-CM | POA: Diagnosis not present

## 2017-02-25 DIAGNOSIS — M25541 Pain in joints of right hand: Secondary | ICD-10-CM | POA: Diagnosis not present

## 2017-02-25 NOTE — Progress Notes (Addendum)
Patient: Alyssa Lawson, Female    DOB: [birth year], 71 y.o.   MRN: [id number]  Visit Date: 02/25/2017  Today's Provider: Loistine Chance, MD   Chief Complaint  Patient presents with  . Medicare Wellness    Subjective:    HPI Alyssa Lawson is a 71 y.o. female who presents today for her Subsequent Annual Wellness Visit.  She is also here for BP follow up: she denies chest pain, palpitation or SOB. She is taking medications as prescribed, she had an abnormal EKG in our office today, we will send her for further evaluation by cardiologist. Discussed signs and symptoms of heart attacks in females.   Patient/Caregiver input:    Review of Systems  Constitutional: Negative for fever or weight change.  Respiratory: Negative for cough and shortness of breath.   Cardiovascular: Negative for chest pain or palpitations.  Gastrointestinal: Negative for abdominal pain, no bowel changes.  Musculoskeletal: Negative for gait problem or joint swelling.  Skin: Negative for rash.  Neurological: Negative for dizziness or headache.  No other specific complaints in a complete review of systems (except as listed in HPI above).  Past Medical History:  Diagnosis Date  . Bladder disorder   . GERD (gastroesophageal reflux disease)   . Hypertension     Past Surgical History:  Procedure Laterality Date  . ABDOMINAL HYSTERECTOMY  1976  . OOPHORECTOMY      Family History  Problem Relation Age of Onset  . Heart failure Father   . Arrhythmia Father   . Heart attack Mother   . Arrhythmia Sister   . Congestive Heart Failure Brother   . Cardiomyopathy Sister   . Diabetes Brother     Social History   Social History  . Marital status: Married    Spouse name: N/A  . Number of children: N/A  . Years of education: N/A   Occupational History  . Not on file.   Social History Main Topics  . Smoking status: Former Smoker    Packs/day: 0.50    Years: 18.00    Types: Cigarettes    Start date:  06/23/1956    Quit date: 11/22/1995  . Smokeless tobacco: Never Used  . Alcohol use 0.0 oz/week     Comment: socially  . Drug use: No  . Sexual activity: Yes    Partners: Male   Other Topics Concern  . 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 aspirin 81 MG chewable tablet Chew 1 tablet by mouth daily. 12/20/2014: DX: 794.31 Received from: [facility]: Chew by mouth.  Marland Kitchen atorvastatin (LIPITOR) 40 MG tablet Take 1 tablet (40 mg total) by mouth daily.   . carvedilol (COREG) 12.5 MG tablet Take 1 tablet (12.5 mg total) by mouth 2 (two) times daily.   . Dulaglutide (TRULICITY) 1.5 PO/2.4MP SOPN Inject 1.5 mg into the skin once a week.   . ferrous sulfate 325 (65 FE) MG tablet Take 325 mg by mouth daily with breakfast.   . fluticasone (FLONASE) 50 MCG/ACT nasal spray USE 1 SPRAY IN EACH NOSTRIL TWO TIMES DAILY   . fluticasone furoate-vilanterol (BREO ELLIPTA) 100-25 MCG/INH AEPB Inhale 1 puff into the lungs daily.   . Magnesium Oxide 400 (  240 MG) MG TABS Take 1 tablet by mouth daily. 12/20/2014: Received from: [facility]: Take by mouth.  . Multiple Vitamin (MULTIVITAMIN) capsule Take 1 capsule by mouth daily.   Marland Kitchen olmesartan (BENICAR) 20 MG tablet Take 1 tablet (20 mg total) by mouth daily.   Marland Kitchen oxybutynin (DITROPAN-XL) 5 MG 24 hr tablet Take 1 tablet (5 mg total) by mouth at bedtime. In place of vesicare   . rOPINIRole (REQUIP) 2 MG tablet Take 1 tablet (2 mg total) by mouth at bedtime.   Marland Kitchen tiZANidine (ZANAFLEX) 2 MG tablet TAKE 1 TABLET BY MOUTH AT  BEDTIME   . valACYclovir (VALTREX) 500 MG tablet TAKE 1 TABLET BY MOUTH 2  TIMES DAILY. PER EPISODE AS NEEDED, TAKE FOR 10 DAYS   . acetaminophen (TYLENOL)  500 MG tablet Take 1 tablet (500 mg total) by mouth every 6 (six) hours as needed for headache. (Patient not taking: Reported on 02/25/2017)   . benzonatate (TESSALON) 100 MG capsule Take 1-2 capsules (100-200 mg total) by mouth 2 (two) times daily as needed. (Patient not taking: Reported on 02/25/2017)    No facility-administered encounter medications on file as of 02/25/2017.     Allergies  Allergen Reactions  . Amlodipine Swelling    Care Team Updated in EHR: Yes - she is not sure of her ophthalmologist name ( [facility] in [place])   Last Vision Exam: 11/22/2016 Wears corrective lenses: Yes Last Dental Exam: 07/2016 Last Hearing Exam: never - but no problems  Wears Hearing Aids: No  Functional Ability / Safety Screening 1.  Was the timed Get Up and Go test shorter than 30 seconds?  yes 2.  Does the patient need help with the phone, transportation, shopping,      preparing meals, housework, laundry, medications, or managing money?  no 3.  Is the patient's home free of loose throw rugs in walkways, pet beds, electrical cords, etc?   yes      Grab bars in the bathroom? yes      Handrails on the stairs?   yes      Adequate lighting?   yes 4.  Has the patient noticed any hearing difficulties?   no  Diet Recall and Exercise Regimen: healthy diet, filled with fruit and vegetables. She skips breakfast.   Current Exercise Habits: The patient does not participate in regular exercise at present    Advanced Directives: A voluntary discussion about advance care planning including the explanation and discussion of advance directives was discussed with the patient. Explanation about the health care proxy and living will was reviewed.  During this discussion, the patient was able to identify a health care proxy as Alyssa Lawson ( son ) , she already has all the paperwork filled out Does patient have a HCPOA?    yes If yes, name and contact information:  Does patient have a living will or  MOST form?  yes  Cancer Screenings: Skin: no lesions Lung: ( quit over 15 years ago)  Low Dose CT Chest recommended if Age 8-80 years, 30 pack-year currently smoking OR have quit w/in 15years. Patient does not qualify.  Lifestyle risk factor issued reviewed: Diet, exercise, weight management, advised patient smoking is not healthy, nutrition/diet.   Breast:  Up to date on Mammogram? No  Up to date of Bone Density/Dexa? Yes Colon: up to date, due for 2024  Additional Screenings:  Hepatitis B/HIV/Syphillis: N/A Hepatitis C Screening:  Done 2014  Intimate  Partner Violence: none  Objective:   Vitals: BP 114/62 (BP Location: Left Arm, Patient Position: Sitting, Cuff Size: Large)   Pulse 91   Temp 98.5 F (36.9 C) (Oral)   Resp 16   Ht 5' 4.75" (1.645 m)   Wt 158 lb 4.8 oz (71.8 kg)   SpO2 96%   BMI 26.55 kg/m  Body mass index is 26.55 kg/m.  No exam data present  Physical Exam Constitutional: Patient appears well-developed and well-nourished.  No distress.  HEENT: head atraumatic, normocephalic, pupils equal and reactive to light, ears normal, neck supple, throat within normal limits Breast: normal exam, no lumps Cardiovascular: Normal rate, regular rhythm and normal heart sounds.  No murmur heard. No BLE edema. Pulmonary/Chest: Effort normal and breath sounds normal. No respiratory distress. Abdominal: Soft.  There is no tenderness. Psychiatric: Patient has a normal mood and affect. behavior is normal. Judgment and thought content normal.  Cognitive Testing - 6-CIT  Correct? Score   What year is it? yes 0 Yes = 0    No = 4  What month is it? yes 0 Yes = 0    No = 3  Remember:     [address]ia [address], [place], [place]     What time is it? yes 0 Yes = 0    No = 3  Count backwards from 20 to 1 yes 0 Correct = 0    1 error = 2   More than 1 error = 4  Say the months of the year in reverse. yes 0 Correct = 0    1 error = 2   More than 1 error = 4  What address did I ask you to  remember? yes 0 Correct = 0  1 error = 2    2 error = 4    3 error = 6    4 error = 8    All wrong = 10       TOTAL SCORE  0/28   Interpretation:  Normal  Normal (0-7) Abnormal (8-28)   Fall Risk: Fall Risk  02/25/2017 08/20/2016 02/20/2016 06/22/2015 12/21/2014  Falls in the past year? No No Yes No No  Number falls in past yr: - - 1 - -  Injury with Fall? - - No - -    Depression Screen Depression screen [facility] 2/9 02/25/2017 08/20/2016 02/20/2016 06/22/2015 12/21/2014  Decreased Interest 0 0 0 0 0  Down, Depressed, Hopeless 0 0 0 0 0  PHQ - 2 Score 0 0 0 0 0    No results found for this or any previous visit (from the past 2160 hour(s)).  Assessment & Plan:    1. Medicare annual wellness visit, subsequent  Discussed importance of 150 minutes of physical activity weekly, eat two servings of fish weekly, eat one serving of tree nuts ( cashews, pistachios, pecans, almonds.Marland Kitchen) every other day, eat 6 servings of fruit/vegetables daily and drink plenty of water and avoid sweet beverages.    2. Needs flu shot  - Flu vaccine HIGH DOSE PF  3. Benign essential HTN  - EKG 12-Lead  4. Encounter for screening mammogram for breast cancer  - MM Digital Screening; Future   Exercise Activities and Dietary recommendations  She will join a gym through [employer] and exercise at least 3 times a week  - Discussed health benefits of physical activity, and encouraged her to engage in regular exercise appropriate for her age and condition.  6. Abnormal EKG  - Ambulatory referral to Cardiology   Immunization History  Administered Date(s) Administered  . Influenza Split 04/27/2007, 04/19/2009  . Influenza, High Dose Seasonal PF 06/22/2015, 06/06/2016  . Influenza,inj,Quad PF,6+ Mos 03/15/2013, 06/19/2014  . Pneumococcal Conjugate-13 12/21/2014  . Pneumococcal Polysaccharide-23 04/27/2007, 09/14/2012  . Td 11/02/2007  . Zoster 07/12/2013    Health Maintenance  Topic Date Due  .  MAMMOGRAM  09/25/2016  . INFLUENZA VACCINE  01/21/2017  . TETANUS/TDAP  11/01/2017  . COLONOSCOPY  02/11/2023  . DEXA SCAN  Completed  . Hepatitis C Screening  Completed  . PNA vac Low Risk Adult  Completed    No orders of the defined types were placed in this encounter.   Current Outpatient Prescriptions:  .  aspirin 81 MG chewable tablet, Chew 1 tablet by mouth daily., Disp: , Rfl:  .  atorvastatin (LIPITOR) 40 MG tablet, Take 1 tablet (40 mg total) by mouth daily., Disp: 90 tablet, Rfl: 1 .  carvedilol (COREG) 12.5 MG tablet, Take 1 tablet (12.5 mg total) by mouth 2 (two) times daily., Disp: 180 tablet, Rfl: 1 .  Dulaglutide (TRULICITY) 1.5 NK/5.3ZJ SOPN, Inject 1.5 mg into the skin once a week., Disp: 12 pen, Rfl: 1 .  ferrous sulfate 325 (65 FE) MG tablet, Take 325 mg by mouth daily with breakfast., Disp: , Rfl:  .  fluticasone (FLONASE) 50 MCG/ACT nasal spray, USE 1 SPRAY IN EACH NOSTRIL TWO TIMES DAILY, Disp: 48 g, Rfl: 1 .  fluticasone furoate-vilanterol (BREO ELLIPTA) 100-25 MCG/INH AEPB, Inhale 1 puff into the lungs daily., Disp: 60 each, Rfl: 0 .  Magnesium Oxide 400 (240 MG) MG TABS, Take 1 tablet by mouth daily., Disp: , Rfl:  .  Multiple Vitamin (MULTIVITAMIN) capsule, Take 1 capsule by mouth daily., Disp: , Rfl:  .  olmesartan (BENICAR) 20 MG tablet, Take 1 tablet (20 mg total) by mouth daily., Disp: 90 tablet, Rfl: 1 .  oxybutynin (DITROPAN-XL) 5 MG 24 hr tablet, Take 1 tablet (5 mg total) by mouth at bedtime. In place of vesicare, Disp: 90 tablet, Rfl: 0 .  rOPINIRole (REQUIP) 2 MG tablet, Take 1 tablet (2 mg total) by mouth at bedtime., Disp: 90 tablet, Rfl: 1 .  tiZANidine (ZANAFLEX) 2 MG tablet, TAKE 1 TABLET BY MOUTH AT  BEDTIME, Disp: 90 tablet, Rfl: 0 .  valACYclovir (VALTREX) 500 MG tablet, TAKE 1 TABLET BY MOUTH 2  TIMES DAILY. PER EPISODE AS NEEDED, TAKE FOR 10 DAYS, Disp: 90 tablet, Rfl: 0 .  acetaminophen (TYLENOL) 500 MG tablet, Take 1 tablet (500 mg total) by  mouth every 6 (six) hours as needed for headache. (Patient not taking: Reported on 02/25/2017), Disp: 30 tablet, Rfl: 0 .  benzonatate (TESSALON) 100 MG capsule, Take 1-2 capsules (100-200 mg total) by mouth 2 (two) times daily as needed. (Patient not taking: Reported on 02/25/2017), Disp: 40 capsule, Rfl: 0 There are no discontinued medications.  I have personally reviewed and addressed the Medicare Annual Wellness health risk assessment questionnaire and have noted the following in the patient's chart:  A.         Medical and social history & family history B.         Use of alcohol, tobacco, and illicit drugs  C.         Current medications and supplements D.         Functional and Cognitive ability and status E.  Nutritional status F.         Physical activity G.        Advance directives H.         List of other physicians I.          Hospitalizations, surgeries, and ER visits in previous 12 months J.         [place] such as hearing, vision, cognitive function, and depression L.         Referrals and appointments: mammogram   In addition, I have reviewed and discussed with patient certain preventive protocols, quality metrics, and best practice recommendations. A written personalized care plan for preventive services as well as general preventive health recommendations were provided to patient.  See attached scanned questionnaire for additional information.

## 2017-02-25 NOTE — Telephone Encounter (Signed)
Patient requesting refill of Oxybutynin to [facility].

## 2017-02-26 ENCOUNTER — Encounter: Payer: Self-pay | Admitting: Family Medicine

## 2017-02-26 LAB — CBC WITH DIFFERENTIAL/PLATELET
Basophils Absolute: 37 cells/uL (ref 0–200)
Basophils Relative: 0.6 %
EOS PCT: 3.6 %
Eosinophils Absolute: 223 cells/uL (ref 15–500)
HEMATOCRIT: 41.1 % (ref 35.0–45.0)
HEMOGLOBIN: 13.7 g/dL (ref 11.7–15.5)
LYMPHS ABS: 1705 {cells}/uL (ref 850–3900)
MCH: 29.7 pg (ref 27.0–33.0)
MCHC: 33.3 g/dL (ref 32.0–36.0)
MCV: 89 fL (ref 80.0–100.0)
MONOS PCT: 8.2 %
MPV: 11.2 fL (ref 7.5–12.5)
NEUTROS ABS: 3726 {cells}/uL (ref 1500–7800)
Neutrophils Relative %: 60.1 %
Platelets: 227 10*3/uL (ref 140–400)
RBC: 4.62 10*6/uL (ref 3.80–5.10)
RDW: 12.4 % (ref 11.0–15.0)
Total Lymphocyte: 27.5 %
WBC mixed population: 508 cells/uL (ref 200–950)
WBC: 6.2 10*3/uL (ref 3.8–10.8)

## 2017-02-26 LAB — HEMOGLOBIN A1C
HEMOGLOBIN A1C: 5.6 %{Hb} (ref <5.7)
Mean Plasma Glucose: 114 (calc)
eAG (mmol/L): 6.3 (calc)

## 2017-02-26 LAB — COMPLETE METABOLIC PANEL WITH GFR
AG RATIO: 1.7 (calc) (ref 1.0–2.5)
ALT: 22 U/L (ref 6–29)
AST: 22 U/L (ref 10–35)
Albumin: 4.4 g/dL (ref 3.6–5.1)
Alkaline phosphatase (APISO): 58 U/L (ref 33–130)
BILIRUBIN TOTAL: 0.4 mg/dL (ref 0.2–1.2)
BUN: 17 mg/dL (ref 7–25)
CHLORIDE: 104 mmol/L (ref 98–110)
CO2: 28 mmol/L (ref 20–32)
Calcium: 10.6 mg/dL — ABNORMAL HIGH (ref 8.6–10.4)
Creat: 0.82 mg/dL (ref 0.60–0.93)
GFR, Est African American: 83 mL/min/{1.73_m2} (ref >=60)
GFR, Est Non African American: 72 mL/min/{1.73_m2} (ref >=60)
GLUCOSE: 101 mg/dL — AB (ref 65–99)
Globulin: 2.6 g/dL (calc) (ref 1.9–3.7)
POTASSIUM: 4.2 mmol/L (ref 3.5–5.3)
Sodium: 143 mmol/L (ref 135–146)
TOTAL PROTEIN: 7 g/dL (ref 6.1–8.1)

## 2017-02-26 LAB — INSULIN, FASTING: INSULIN: 6.4 u[IU]/mL (ref 2.0–19.6)

## 2017-02-26 LAB — SEDIMENTATION RATE: Sed Rate: 6 mm/h (ref 0–30)

## 2017-02-26 LAB — C-REACTIVE PROTEIN: CRP: 0.6 mg/L (ref <8.0)

## 2017-02-26 LAB — TSH: TSH: 1.15 m[IU]/L (ref 0.40–4.50)

## 2017-03-11 DIAGNOSIS — M7542 Impingement syndrome of left shoulder: Secondary | ICD-10-CM | POA: Diagnosis not present

## 2017-03-13 ENCOUNTER — Encounter: Payer: Self-pay | Admitting: Family Medicine

## 2017-03-13 ENCOUNTER — Other Ambulatory Visit: Payer: Self-pay | Admitting: Specialist

## 2017-03-13 DIAGNOSIS — M7542 Impingement syndrome of left shoulder: Secondary | ICD-10-CM

## 2017-03-15 ENCOUNTER — Other Ambulatory Visit: Payer: Self-pay | Admitting: Family Medicine

## 2017-03-15 MED ORDER — OXYBUTYNIN CHLORIDE 5 MG PO TABS: 5.0000 mg | ORAL_TABLET | Freq: Two times a day (BID) | ORAL | 0 refills | Status: DC

## 2017-03-20 ENCOUNTER — Ambulatory Visit
Admission: RE | Admit: 2017-03-20 | Discharge: 2017-03-20 | Disposition: A | Discharge status: Home / Self Care | Payer: Medicare Other | Source: Ambulatory Visit | Attending: Specialist | Admitting: Specialist

## 2017-03-20 DIAGNOSIS — M25512 Pain in left shoulder: Secondary | ICD-10-CM | POA: Diagnosis not present

## 2017-03-20 DIAGNOSIS — M7542 Impingement syndrome of left shoulder: Secondary | ICD-10-CM | POA: Diagnosis not present

## 2017-03-20 DIAGNOSIS — M7552 Bursitis of left shoulder: Secondary | ICD-10-CM | POA: Insufficient documentation

## 2017-03-20 DIAGNOSIS — M12812 Other specific arthropathies, not elsewhere classified, left shoulder: Secondary | ICD-10-CM | POA: Insufficient documentation

## 2017-03-26 ENCOUNTER — Other Ambulatory Visit: Payer: Self-pay | Admitting: Family Medicine

## 2017-03-26 ENCOUNTER — Encounter: Payer: Self-pay | Admitting: Family Medicine

## 2017-03-26 DIAGNOSIS — M549 Dorsalgia, unspecified: Principal | ICD-10-CM

## 2017-03-26 DIAGNOSIS — G8929 Other chronic pain: Secondary | ICD-10-CM

## 2017-03-26 MED ORDER — OXYBUTYNIN CHLORIDE 5 MG PO TABS: 5.0000 mg | ORAL_TABLET | Freq: Two times a day (BID) | ORAL | 0 refills | Status: DC

## 2017-03-26 NOTE — Telephone Encounter (Signed)
Patient requesting refill of Flonase and Tizanidine to [facility].

## 2017-03-27 DIAGNOSIS — M25519 Pain in unspecified shoulder: Secondary | ICD-10-CM | POA: Diagnosis not present

## 2017-04-10 ENCOUNTER — Encounter: Payer: Self-pay | Admitting: Family Medicine

## 2017-04-10 ENCOUNTER — Ambulatory Visit (INDEPENDENT_AMBULATORY_CARE_PROVIDER_SITE_OTHER): Payer: Medicare Other | Admitting: Family Medicine

## 2017-04-10 VITALS — BP 144/68 | HR 87 | Temp 97.9°F | Resp 16 | Ht 65.0 in | Wt 165.8 lb

## 2017-04-10 DIAGNOSIS — E8881 Metabolic syndrome: Secondary | ICD-10-CM | POA: Diagnosis not present

## 2017-04-10 DIAGNOSIS — M7582 Other shoulder lesions, left shoulder: Secondary | ICD-10-CM | POA: Diagnosis not present

## 2017-04-10 DIAGNOSIS — G2581 Restless legs syndrome: Secondary | ICD-10-CM

## 2017-04-10 DIAGNOSIS — E785 Hyperlipidemia, unspecified: Secondary | ICD-10-CM

## 2017-04-10 DIAGNOSIS — I10 Essential (primary) hypertension: Secondary | ICD-10-CM | POA: Diagnosis not present

## 2017-04-10 MED ORDER — CARVEDILOL 12.5 MG PO TABS: 12.5000 mg | ORAL_TABLET | Freq: Two times a day (BID) | ORAL | 1 refills | Status: DC

## 2017-04-10 MED ORDER — DULAGLUTIDE 1.5 MG/0.5ML ~~LOC~~ SOAJ: 1.5000 mg | SUBCUTANEOUS | 1 refills | Status: DC

## 2017-04-10 MED ORDER — FLUTICASONE PROPIONATE 50 MCG/ACT NA SUSP: NASAL | 0 refills | Status: DC

## 2017-04-10 MED ORDER — OLMESARTAN MEDOXOMIL 20 MG PO TABS: 20.0000 mg | ORAL_TABLET | Freq: Every day | ORAL | 1 refills | Status: DC

## 2017-04-10 MED ORDER — ROPINIROLE HCL 2 MG PO TABS: 2.0000 mg | ORAL_TABLET | Freq: Every day | ORAL | 1 refills | Status: DC

## 2017-04-10 MED ORDER — FLUTICASONE FUROATE-VILANTEROL 100-25 MCG/INH IN AEPB: 1.0000 | INHALATION_SPRAY | Freq: Every day | RESPIRATORY_TRACT | 1 refills | Status: DC

## 2017-04-10 MED ORDER — ATORVASTATIN CALCIUM 40 MG PO TABS: 40.0000 mg | ORAL_TABLET | Freq: Every day | ORAL | 1 refills | Status: DC

## 2017-04-10 NOTE — Progress Notes (Signed)
Name: Alyssa Lawson   MRN: [id number]    DOB: 1945-11-05   Date:04/10/2017       Progress Note  Subjective  Chief Complaint  Chief Complaint  Patient presents with  . Medication Refill  . Hypertension  . Hyperlipidemia  . RLS  . Back Pain    States it has not been that bad  . Urinary Incontinence    States Ditropan is doing ok, Vesicare was too expensive   . Metabolic Syndrome    HPI   HTN: taking medications, bp is under control, no chest pain, no palpitation . She is off diuretic, on Benicar, she is not sure if she is taking Coreg.  Hyperlipidemia: last labs done last March 2018 and she is no on Atorvastatin. Denies side effects, no myalgias HDL is very good and LDL at goal  Metabolic Syndrome: last [id number] was down to 5.6% she denies polyphagia or polydipsia, she has urinary frequency and has gained a lot of weight since last visit. She is taking Metformin and only side effects is easy bruising. Started Trulicity back in 91/4782 and is doing well, except for recent weight gain. She will resume her diet, she was on vacation with her sister last week.   Urinary incontinence: she has been on Ditropan for many years, but symptoms got worse, so we switched to [facility] however she states too expensive and would like to go back to Oxybutin and is doing well at this time. She gets up at most once when she drinks tea prior to going to bed  RLS: better with medication, sleeping well with medication,- Requip -very seldom has symptoms.   Chronic Back Pain: she has daily low back pain, describes a dull pain , 10/10no radiation. No weakens or tingling. She takes Tylenol prn and also Tizanidine  very seldom , explained not to take it with methocarbamol,  She left her job back in May 2018 - and is doing better  Wheezing: resolved, off Breo, remote history of tobacco use, no wheezing, cough or SOB since last episode of bronchitis back in July 2018  Rotator cuff tendinitis left  shoulder: doing well since seen by Dr. Sabra Heck   Patient Active Problem List   Diagnosis Date Noted  . Cataracts, bilateral 11/25/2016  . Impingement syndrome of shoulder, left 10/13/2016  . Chronic bilateral low back pain without sciatica 08/20/2016  . Metabolic syndrome 95/62/1308  . Mitral regurgitation 06/22/2015  . Melasma 12/21/2014  . History of hysterectomy 12/21/2014  . Restless leg syndrome 12/21/2014  . Benign essential HTN 12/17/2014  . Cataract 12/17/2014  . Dyslipidemia 12/17/2014  . Gastric reflux 12/17/2014  . History of cervical cancer 12/17/2014  . H/O iron deficiency anemia 12/17/2014  . Urinary incontinence in female 12/17/2014  . Dysmetabolic syndrome 65/78/4696  . TI (tricuspid incompetence) 12/17/2014  . Osteopenia of the elderly 12/17/2014    Past Surgical History:  Procedure Laterality Date  . ABDOMINAL HYSTERECTOMY  1976  . OOPHORECTOMY      Family History  Problem Relation Age of Onset  . Heart failure Father   . Arrhythmia Father   . Heart attack Mother   . Arrhythmia Sister   . Congestive Heart Failure Brother   . Cardiomyopathy Sister   . Diabetes Brother     Social History   Social History  . Marital status: Married    Spouse name: N/A  . Number of children: N/A  . Years of education: N/A   Occupational History  .  Not on file.   Social History Main Topics  . Smoking status: Former Smoker    Packs/day: 0.50    Years: 18.00    Types: Cigarettes    Start date: 06/23/1956    Quit date: 11/22/1995  . Smokeless tobacco: Never Used  . Alcohol use 0.0 oz/week     Comment: socially  . Drug use: No  . Sexual activity: Yes    Partners: Male   Other Topics Concern  . 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  ferrous sulfate 325  (65 FE) MG tablet, Take 325 mg by mouth daily with breakfast., Disp: , Rfl:  .  Magnesium Oxide 400 (240 MG) MG TABS, Take 1 tablet by mouth daily., Disp: , Rfl:  .  meloxicam (MOBIC) 15 MG tablet, Take 1 tablet by mouth daily., Disp: , Rfl: 3 .  methocarbamol (ROBAXIN) 500 MG tablet, Take 1 tablet by mouth 2 (two) times daily., Disp: , Rfl: 1 .  Multiple Vitamin (MULTIVITAMIN) capsule, Take 1 capsule by mouth daily., Disp: , Rfl:  .  oxybutynin (DITROPAN) 5 MG tablet, Take 1 tablet (5 mg total) by mouth 2 (two) times daily., Disp: 180 tablet, Rfl: 0 .  tiZANidine (ZANAFLEX) 2 MG tablet, TAKE 1 TABLET BY MOUTH AT  BEDTIME, Disp: 90 tablet, Rfl: 0 .  valACYclovir (VALTREX) 500 MG tablet, TAKE 1 TABLET BY MOUTH 2  TIMES DAILY. PER EPISODE AS NEEDED, TAKE FOR 10 DAYS, Disp: 90 tablet, Rfl: 0 .  acetaminophen (TYLENOL) 500 MG tablet, Take 1 tablet (500 mg total) by mouth every 6 (six) hours as needed for headache. (Patient not taking: Reported on 04/10/2017), Disp: 30 tablet, Rfl: 0 .  aspirin 81 MG chewable tablet, Chew 1 tablet by mouth daily., Disp: , Rfl:  .  atorvastatin (LIPITOR) 40 MG tablet, Take 1 tablet (40 mg total) by mouth daily., Disp: 90 tablet, Rfl: 1 .  carvedilol (COREG) 12.5 MG tablet, Take 1 tablet (12.5 mg total) by mouth 2 (two) times daily., Disp: 180 tablet, Rfl: 1 .  Dulaglutide (TRULICITY) 1.5 EY/8.1KG SOPN, Inject 1.5 mg into the skin once a week., Disp: 12 pen, Rfl: 1 .  fluticasone (FLONASE) 50 MCG/ACT nasal spray, USE 1 SPRAY IN EACH NOSTRIL TWO TIMES DAILY, Disp: 48 g, Rfl: 0 .  olmesartan (BENICAR) 20 MG tablet, Take 1 tablet (20 mg total) by mouth daily., Disp: 90 tablet, Rfl: 1 .  rOPINIRole (REQUIP) 2 MG tablet, Take 1 tablet (2 mg total) by mouth at bedtime., Disp: 90 tablet, Rfl: 1  Allergies  Allergen Reactions  . Amlodipine Swelling     ROS  Constitutional: Negative for fever , positive for weight change.  Respiratory: Negative for cough and shortness of  breath.   Resolved  Cardiovascular: Negative for chest pain or palpitations.  Gastrointestinal: Negative for abdominal pain, no bowel changes.  Musculoskeletal: Negative for gait problem , positive for joint stiffness on hands, no swelling  Skin: Negative for rash.  Neurological: Negative for dizziness or headache.  No other specific complaints in a complete review of systems (except as listed in HPI above).  Objective  Vitals:   04/10/17 0919  BP: (!) 144/68  Pulse: 87  Resp: 16  Temp: 97.9 F (36.6 C)  TempSrc: Oral  SpO2: 98%  Weight: 165 lb 12.8 oz (75.2 kg)  Height: 5\' 5"  (1.651 m)    Body mass index is 27.59 kg/m.  Physical Exam  Constitutional: Patient appears well-developed and well-nourished. Overweight No distress.  HEENT: head atraumatic, normocephalic, pupils equal and reactive to light, , neck supple, throat within normal limits Cardiovascular: Normal rate, regular rhythm and normal heart sounds.  No murmur heard. No BLE edema. Pulmonary/Chest: Effort normal and bilateral scattered wheezing on exam. No respiratory distress. Abdominal: Soft.  There is no tenderness. Psychiatric: Patient has a normal mood and affect. behavior is normal. Judgment and thought content normal. Muscular Skeletal: no signs of synovitis, PIP a little larger than DIP but no deformities, normal rom of left shoulder  Recent Results (from the past 2160 hour(s))  TSH     Status: None   Collection Time: 02/25/17  9:25 AM  Result Value Ref Range   TSH 1.15 0.40 - 4.50 mIU/L  Insulin, fasting     Status: None   Collection Time: 02/25/17  9:25 AM  Result Value Ref Range   Insulin 6.4 2.0 - 19.6 uIU/mL    Comment: This insulin assay shows strong cross-reactivity for some insulin analogs (lispro, aspart, and glargine) and much lower cross-reactivity with others (detemir, glulisine).   COMPLETE METABOLIC PANEL WITH GFR     Status: Abnormal   Collection Time: 02/25/17  9:25 AM  Result Value  Ref Range   Glucose, Bld 101 (H) 65 - 99 mg/dL    Comment: .            Fasting reference interval . For someone without known diabetes, a glucose value between 100 and 125 mg/dL is consistent with prediabetes and should be confirmed with a follow-up test. .    BUN 17 7 - 25 mg/dL   Creat 0.82 0.60 - 0.93 mg/dL    Comment: For patients >58 years of age, the reference limit for Creatinine is approximately 13% higher for people identified as African-American. .    GFR, Est Non African American 72 > OR = 60 mL/min/1.90m2   GFR, Est African American 83 > OR = 60 mL/min/1.34m2   BUN/Creatinine Ratio NOT APPLICABLE 6 - 22 (calc)   Sodium 143 135 - 146 mmol/L   Potassium 4.2 3.5 - 5.3 mmol/L   Chloride 104 98 - 110 mmol/L   CO2 28 20 - 32 mmol/L   Calcium 10.6 (H) 8.6 - 10.4 mg/dL   Total Protein 7.0 6.1 - 8.1 g/dL   Albumin 4.4 3.6 - 5.1 g/dL   Globulin 2.6 1.9 - 3.7 g/dL (calc)   AG Ratio 1.7 1.0 - 2.5 (calc)   Total Bilirubin 0.4 0.2 - 1.2 mg/dL   Alkaline phosphatase (APISO) 58 33 - 130 U/L   AST 22 10 - 35 U/L   ALT 22 6 - 29 U/L  CBC with Differential/Platelet     Status: None   Collection Time: 02/25/17  9:25 AM  Result Value Ref Range   WBC 6.2 3.8 - 10.8 Thousand/uL   RBC 4.62 3.80 - 5.10 Million/uL   Hemoglobin 13.7 11.7 - 15.5 g/dL   HCT 41.1 35.0 - 45.0 %   MCV 89.0 80.0 - 100.0 fL   MCH 29.7 27.0 - 33.0 pg   MCHC 33.3 32.0 - 36.0 g/dL   RDW 12.4 11.0 - 15.0 %   Platelets 227 140 - 400 Thousand/uL   MPV 11.2 7.5 - 12.5 fL  Neutro Abs 3,726 1,500 - 7,800 cells/uL   Lymphs Abs 1,705 850 - 3,900 cells/uL   WBC mixed population 508 200 - 950 cells/uL   Eosinophils Absolute 223 15 - 500 cells/uL   Basophils Absolute 37 0 - 200 cells/uL   Neutrophils Relative % 60.1 %   Total Lymphocyte 27.5 %   Monocytes Relative 8.2 %   Eosinophils Relative 3.6 %   Basophils Relative 0.6 %  Hemoglobin A1c     Status: None   Collection Time: 02/25/17  9:25 AM  Result Value  Ref Range   Hgb A1c MFr Bld 5.6 <5.7 % of total Hgb    Comment: For the purpose of screening for the presence of diabetes: . <5.7%       Consistent with the absence of diabetes 5.7-6.4%    Consistent with increased risk for diabetes             (prediabetes) > or =6.5%  Consistent with diabetes . This assay result is consistent with a decreased risk of diabetes. . Currently, no consensus exists regarding use of hemoglobin A1c for diagnosis of diabetes in children. . According to American Diabetes Association (ADA) guidelines, hemoglobin A1c <7.0% represents optimal control in non-pregnant diabetic patients. Different metrics may apply to specific patient populations.  Standards of Medical Care in Diabetes(ADA). .    Mean Plasma Glucose 114 (calc)   eAG (mmol/L) 6.3 (calc)  C-reactive protein     Status: None   Collection Time: 02/25/17  9:25 AM  Result Value Ref Range   CRP 0.6 <8.0 mg/L  Sedimentation rate     Status: None   Collection Time: 02/25/17  9:25 AM  Result Value Ref Range   Sed Rate 6 0 - 30 mm/h     PHQ2/9: Depression screen [facility] 2/9 02/25/2017 08/20/2016 02/20/2016 06/22/2015 12/21/2014  Decreased Interest 0 0 0 0 0  Down, Depressed, Hopeless 0 0 0 0 0  PHQ - 2 Score 0 0 0 0 0     Fall Risk: Fall Risk  02/25/2017 08/20/2016 02/20/2016 06/22/2015 12/21/2014  Falls in the past year? No No Yes No No  Number falls in past yr: - - 1 - -  Injury with Fall? - - No - -      Assessment & Plan  1. Dyslipidemia  - atorvastatin (LIPITOR) 40 MG tablet; Take 1 tablet (40 mg total) by mouth daily.  Dispense: 90 tablet; Refill: 1  2. Benign essential HTN  She states not sure if taking Coreg, also was on vacation and not eating healthy lately - carvedilol (COREG) 12.5 MG tablet; Take 1 tablet (12.5 mg total) by mouth 2 (two) times daily.  Dispense: 180 tablet; Refill: 1 - olmesartan (BENICAR) 20 MG tablet; Take 1 tablet (20 mg total) by mouth daily.  Dispense: 90  tablet; Refill: 1  3. Dysmetabolic syndrome  - Dulaglutide (TRULICITY) 1.5 PP/5.0DT SOPN; Inject 1.5 mg into the skin once a week.  Dispense: 12 pen; Refill: 1  4. RLS (restless legs syndrome)  - rOPINIRole (REQUIP) 2 MG tablet; Take 1 tablet (2 mg total) by mouth at bedtime.  Dispense: 90 tablet; Refill: 1  5. Rotator cuff tendinitis, left   Seeing Dr. Sabra Heck, and has steroid taper and is currently on methocarbamol and meloxicam and is doing well at this time

## 2017-04-14 ENCOUNTER — Encounter: Payer: Self-pay | Admitting: Family Medicine

## 2017-04-22 NOTE — Progress Notes (Signed)
Cardiology Office Note  Date:  04/24/2017   ID:  Alyssa Lawson, DOB 11-12-1945, MRN [id number]  PCP:  Steele Sizer, MD   Chief Complaint  Patient presents with  . OTHER    ABN EKG c/o chest pain believes to be "gas". Meds reviewed verbally with pt.    HPI:  Alyssa Lawson is a pleasant 71 year old woman with past medical history of HTN Hyperlipidemia: 225 chol down to 163 on lipitor  Metabolic Syndrome: last OFBP1W  5.6% , down 6.3,  Smoked, quit 1998, started age 22, 38 years, <1 ppd Who presents by referral from Dr. Ancil Boozer for evaluation of abnormal EKG and chest pain  She reports having long history of GERD symptoms Feels that when she eats the wrong foods, she gets "gas" Symptoms typically resolved with belching When she has gas, has chest discomfort Denies having significant chest pain on exertion No significant shortness of breath when she is active She was on Nexium and symptoms resolved, now takes Nexium as needed, takes Zantac as needed  Some symptoms of: Urinary incontinence: RLS Chronic Back Pain  Wheezing  Rotator cuff tendinitis left shoulder  Reports having testing in the past including Echo 2012 Treadmill years ago, "done a couple" No testing recently  Has not been told of abnormal EKG in the past Long discussion concerning family history, father with cardiac disease  EKG personally reviewed by myself on todays visit Shows normal sinus rhythm with rate 83 bpm, T wave abnormality V4 through V6, inferior leads III and aVF   PMH:   has a past medical history of Bladder disorder; Endometriosis; GERD (gastroesophageal reflux disease); Heart murmur; and Hypertension.  PSH:    Past Surgical History:  Procedure Laterality Date  . ABDOMINAL HYSTERECTOMY  1976  . OOPHORECTOMY      Current Outpatient Prescriptions  Medication Sig Dispense Refill  . acetaminophen (TYLENOL) 500 MG tablet Take 1 tablet (500 mg total) by mouth every 6 (six) hours as needed for  headache. 30 tablet 0  . aspirin 81 MG chewable tablet Chew 1 tablet by mouth daily.    Marland Kitchen atorvastatin (LIPITOR) 40 MG tablet Take 1 tablet (40 mg total) by mouth daily. 90 tablet 1  . carvedilol (COREG) 12.5 MG tablet Take 1 tablet (12.5 mg total) by mouth 2 (two) times daily. 180 tablet 1  . Dulaglutide (TRULICITY) 1.5 CH/8.5ID SOPN Inject 1.5 mg into the skin once a week. 12 pen 1  . ferrous sulfate 325 (65 FE) MG tablet Take 325 mg by mouth daily with breakfast.    . fluticasone (FLONASE) 50 MCG/ACT nasal spray USE 1 SPRAY IN EACH NOSTRIL TWO TIMES DAILY 48 g 0  . Magnesium Oxide 400 (240 MG) MG TABS Take 1 tablet by mouth daily.    . meloxicam (MOBIC) 15 MG tablet Take 1 tablet by mouth daily.  3  . methocarbamol (ROBAXIN) 500 MG tablet Take 1 tablet by mouth 2 (two) times daily.  1  . Multiple Vitamin (MULTIVITAMIN) capsule Take 1 capsule by mouth daily.    Marland Kitchen olmesartan (BENICAR) 20 MG tablet Take 1 tablet (20 mg total) by mouth daily. 90 tablet 1  . oxybutynin (DITROPAN) 5 MG tablet Take 1 tablet (5 mg total) by mouth 2 (two) times daily. 180 tablet 0  . rOPINIRole (REQUIP) 2 MG tablet Take 1 tablet (2 mg total) by mouth at bedtime. 90 tablet 1  . tiZANidine (ZANAFLEX) 2 MG tablet TAKE 1 TABLET BY MOUTH AT  BEDTIME  90 tablet 0  . valACYclovir (VALTREX) 500 MG tablet TAKE 1 TABLET BY MOUTH 2  TIMES DAILY. PER EPISODE AS NEEDED, TAKE FOR 10 DAYS 90 tablet 0   No current facility-administered medications for this visit.      Allergies:   Amlodipine   Social History:  The patient  reports that she quit smoking about 21 years ago. Her smoking use included Cigarettes. She started smoking about 60 years ago. She has a 9.00 pack-year smoking history. She quit smokeless tobacco use about 20 years ago. She reports that she drinks alcohol. She reports that she does not use drugs.   Family History:   family history includes Arrhythmia in her father and sister; Cardiomyopathy in her sister;  Congestive Heart Failure in her brother; Diabetes in her brother; Heart failure in her father.    Review of Systems: Review of Systems  Constitutional: Negative.   Respiratory: Negative.   Cardiovascular: Positive for chest pain.  Gastrointestinal: Positive for heartburn.  Musculoskeletal: Positive for joint pain.  Neurological: Negative.   Psychiatric/Behavioral: Negative.   All other systems reviewed and are negative.    PHYSICAL EXAM: VS:  BP (!) 190/98 (BP Location: Right Arm, Patient Position: Sitting, Cuff Size: Normal)   Pulse 83   Ht 5\' 5"  (1.651 m)   Wt 165 lb 12 oz (75.2 kg)   BMI 27.58 kg/m  , BMI Body mass index is 27.58 kg/m. GEN: Well nourished, well developed, in no acute distress  HEENT: normal  Neck: no JVD, carotid bruits, or masses Cardiac: RRR; no murmurs, rubs, or gallops,no edema  Respiratory:  clear to auscultation bilaterally, normal work of breathing GI: soft, nontender, nondistended, + BS MS: no deformity or atrophy  Skin: warm and dry, no rash Neuro:  Strength and sensation are intact Psych: euthymic mood, full affect    Recent Labs: 02/25/2017: ALT 22; BUN 17; Creat 0.82; Hemoglobin 13.7; Platelets 227; Potassium 4.2; Sodium 143; TSH 1.15    Lipid Panel Lab Results  Component Value Date   CHOL 164 08/29/2016   HDL 81 08/29/2016   LDLCALC 69 08/29/2016   TRIG 69 08/29/2016      Wt Readings from Last 3 Encounters:  04/24/17 165 lb 12 oz (75.2 kg)  04/10/17 165 lb 12.8 oz (75.2 kg)  02/25/17 158 lb 4.8 oz (71.8 kg)       ASSESSMENT AND PLAN:  Chest pain, unspecified type - Plan: EKG 12-Lead, NM Myocar Multi W/Spect W/Wall Motion / EF chest pain with atypical features, abnormal EKG Several risk factors for coronary disease Sedentary, no regular exercise program though active Recommended stress testing as below  Benign essential HTN Blood pressure markedly elevated on today's visit Pressure elevated even on my recheck at the  end of the visit She does report blood pressure will read error at home.  Unclear reason, possibly from elevated pressures.  Review of previous blood pressures over the past year have shown relatively well-controlled blood pressure  She will start to recheck her blood pressure at home and call our office with numbers  Abnormal EKG T wave abnormality in anterolateral leads, inferior leads Several risk factors for ischemia including smoking history 30 years or more, diabetes, hyperlipidemia.  Recent chest pain symptoms, unable to exclude ischemia  Recommended treadmill Myoview to rule out ischemia  Dyslipidemia Currently close to goal LDL less than 70  Smoking history Stopped smoking many years ago, smoked for 35 years approximately  Type 2 diabetes mellitus with complication,  with long-term current use of insulin (HCC) Dramatic improvement in her hemoglobin A1c, now at goal Recommended regular walking program, low carbohydrates  Disposition:   F/U as needed   Total encounter time more than 60 minutes  Greater than 50% was spent in counseling and coordination of care with the patient    Orders Placed This Encounter  Procedures  . NM Myocar Multi W/Spect W/Wall Motion / EF  . EKG 12-Lead     Signed, Esmond Plants, M.D., Ph.D. 04/24/2017  [place], [place]

## 2017-04-24 ENCOUNTER — Ambulatory Visit (INDEPENDENT_AMBULATORY_CARE_PROVIDER_SITE_OTHER): Payer: Medicare Other | Admitting: Cardiovascular Disease

## 2017-04-24 ENCOUNTER — Encounter: Payer: Self-pay | Admitting: Cardiovascular Disease

## 2017-04-24 VITALS — BP 190/98 | HR 83 | Ht 65.0 in | Wt 165.8 lb

## 2017-04-24 DIAGNOSIS — Z794 Long term (current) use of insulin: Secondary | ICD-10-CM | POA: Diagnosis not present

## 2017-04-24 DIAGNOSIS — Z87891 Personal history of nicotine dependence: Secondary | ICD-10-CM | POA: Insufficient documentation

## 2017-04-24 DIAGNOSIS — I10 Essential (primary) hypertension: Secondary | ICD-10-CM | POA: Diagnosis not present

## 2017-04-24 DIAGNOSIS — E785 Hyperlipidemia, unspecified: Secondary | ICD-10-CM | POA: Diagnosis not present

## 2017-04-24 DIAGNOSIS — E118 Type 2 diabetes mellitus with unspecified complications: Secondary | ICD-10-CM

## 2017-04-24 DIAGNOSIS — R9431 Abnormal electrocardiogram [ECG] [EKG]: Secondary | ICD-10-CM

## 2017-04-24 DIAGNOSIS — R079 Chest pain, unspecified: Secondary | ICD-10-CM | POA: Insufficient documentation

## 2017-04-24 NOTE — Patient Instructions (Addendum)
Please call with blood pressure measurements next week   Medication Instructions:  No changes. Please call us with your blood pressure readings in one week.  Labwork: No new labs.   Testing/Procedures: [place]  Your caregiver has ordered a Stress Test with nuclear imaging. The purpose of this test is to evaluate the blood supply to your heart muscle. This procedure is referred to as a "Non-Invasive Stress Test." This is because other than having an IV started in your vein, nothing is inserted or "invades" your body. Cardiac stress tests are done to find areas of poor blood flow to the heart by determining the extent of coronary artery disease (CAD). Some patients exercise on a treadmill, which naturally increases the blood flow to your heart, while others who are  unable to walk on a treadmill due to physical limitations have a pharmacologic/chemical stress agent called Lexiscan . This medicine will mimic walking on a treadmill by temporarily increasing your coronary blood flow.   Please note: these test may take anywhere between 2-4 hours to complete  PLEASE REPORT TO [place] AT THE FIRST DESK WILL DIRECT YOU WHERE TO GO  Date of Procedure:__Thursday November 8th_____  Arrival Time for Procedure:___07:15 AM_______  Instructions regarding medication:   __X__ : Hold diabetes medication morning of procedure  __X__:  Hold carvedilol the night before procedure and morning of procedure   PLEASE NOTIFY THE OFFICE AT LEAST 24 HOURS IN ADVANCE IF YOU ARE UNABLE TO KEEP YOUR APPOINTMENT.  [phone] AND  PLEASE NOTIFY NUCLEAR MEDICINE AT [facility] AT LEAST 24 HOURS IN ADVANCE IF YOU ARE UNABLE TO KEEP YOUR APPOINTMENT. [phone]  How to prepare for your Myoview test:  1. Do not eat or drink after midnight 2. No caffeine for 24 hours prior to test 3. No smoking 24 hours prior to test. 4. Your medication may be taken with water.  If your doctor  stopped a medication because of this test, do not take that medication. 5. Ladies, please do not wear dresses.  Skirts or pants are appropriate. Please wear a short sleeve shirt. 6. No perfume, cologne or lotion. 7. Wear comfortable walking shoes. No heels!   Follow-Up: Your physician recommends that you schedule a follow-up appointment as needed. We will call you with results.  It was a pleasure seeing you today here in the office. Please do not hesitate to give [place] a call back if you have any further questions. [place], BSN

## 2017-04-29 ENCOUNTER — Telehealth: Payer: Self-pay | Admitting: Cardiovascular Disease

## 2017-04-29 DIAGNOSIS — Z01812 Encounter for preprocedural laboratory examination: Secondary | ICD-10-CM

## 2017-04-29 NOTE — Telephone Encounter (Signed)
Pt c/o BP issue: STAT if pt c/o blurred vision, one-sided weakness or slurred speech  1. What are your last 5 BP readings?    11/7 930 am 155/92  HR 82  11/7 1130 am  151/94  HR 79  11/7 130 pm 142/86  HR 85    2. Are you having any other symptoms (ex. Dizziness, headache, blurred vision, passed out)? None   3. What is your BP issue? Elevated at last ov

## 2017-04-30 ENCOUNTER — Encounter: Payer: Self-pay | Admitting: Family Medicine

## 2017-04-30 ENCOUNTER — Encounter: Payer: Self-pay | Admitting: *Deleted

## 2017-04-30 ENCOUNTER — Other Ambulatory Visit: Payer: Self-pay | Admitting: Cardiovascular Disease

## 2017-04-30 ENCOUNTER — Encounter
Admission: RE | Admit: 2017-04-30 | Discharge: 2017-04-30 | Disposition: A | Discharge status: Home / Self Care | Payer: Medicare Other | Source: Ambulatory Visit | Attending: Cardiovascular Disease | Admitting: Cardiovascular Disease

## 2017-04-30 ENCOUNTER — Other Ambulatory Visit
Admission: RE | Admit: 2017-04-30 | Discharge: 2017-04-30 | Disposition: A | Discharge status: Home / Self Care | Payer: Medicare Other | Source: Ambulatory Visit | Attending: Cardiovascular Disease | Admitting: Cardiovascular Disease

## 2017-04-30 DIAGNOSIS — Z01812 Encounter for preprocedural laboratory examination: Secondary | ICD-10-CM | POA: Diagnosis not present

## 2017-04-30 DIAGNOSIS — R079 Chest pain, unspecified: Secondary | ICD-10-CM | POA: Insufficient documentation

## 2017-04-30 DIAGNOSIS — I200 Unstable angina: Secondary | ICD-10-CM

## 2017-04-30 LAB — BASIC METABOLIC PANEL
Anion gap: 10 (ref 5–15)
BUN: 24 mg/dL — AB (ref 6–20)
CALCIUM: 9.2 mg/dL (ref 8.9–10.3)
CHLORIDE: 106 mmol/L (ref 101–111)
CO2: 24 mmol/L (ref 22–32)
CREATININE: 0.94 mg/dL (ref 0.44–1.00)
GFR calc non Af Amer: 60 mL/min — ABNORMAL LOW (ref >60)
GLUCOSE: 96 mg/dL (ref 65–99)
Potassium: 4.1 mmol/L (ref 3.5–5.1)
Sodium: 140 mmol/L (ref 135–145)

## 2017-04-30 LAB — CBC WITH DIFFERENTIAL/PLATELET
BASOS PCT: 1 %
Basophils Absolute: 0.1 10*3/uL (ref 0–0.1)
Eosinophils Absolute: 0.3 10*3/uL (ref 0–0.7)
Eosinophils Relative: 3 %
HEMATOCRIT: 40.1 % (ref 35.0–47.0)
HEMOGLOBIN: 13.2 g/dL (ref 12.0–16.0)
LYMPHS ABS: 1.9 10*3/uL (ref 1.0–3.6)
Lymphocytes Relative: 17 %
MCH: 29.8 pg (ref 26.0–34.0)
MCHC: 33 g/dL (ref 32.0–36.0)
MCV: 90.2 fL (ref 80.0–100.0)
MONO ABS: 0.8 10*3/uL (ref 0.2–0.9)
MONOS PCT: 7 %
NEUTROS ABS: 8.1 10*3/uL — AB (ref 1.4–6.5)
NEUTROS PCT: 72 %
Platelets: 267 10*3/uL (ref 150–440)
RBC: 4.44 MIL/uL (ref 3.80–5.20)
RDW: 14.5 % (ref 11.5–14.5)
WBC: 11.1 10*3/uL — ABNORMAL HIGH (ref 3.6–11.0)

## 2017-04-30 LAB — NM MYOCAR MULTI W/SPECT W/WALL MOTION / EF
CHL CUP NUCLEAR SDS: 13
CHL CUP NUCLEAR SRS: 6
CHL CUP NUCLEAR SSS: 18
CSEPED: 3 min
Estimated workload: 4.6 METS
Exercise duration (sec): 30 s
LVDIAVOL: 62 mL (ref 46–106)
LVSYSVOL: 31 mL
Peak HR: 139 {beats}/min
Percent HR: 93 %
Rest HR: 85 {beats}/min
TID: 0.75

## 2017-04-30 LAB — PROTIME-INR
INR: 1.04
PROTHROMBIN TIME: 13.5 s (ref 11.4–15.2)

## 2017-04-30 MED ORDER — TECHNETIUM TC 99M TETROFOSMIN IV KIT
10.3400 | PACK | Freq: Once | INTRAVENOUS | Status: AC | PRN
  Administered 2017-04-30: 10.34 via INTRAVENOUS

## 2017-04-30 MED ORDER — TECHNETIUM TC 99M TETROFOSMIN IV KIT
28.0000 | PACK | Freq: Once | INTRAVENOUS | Status: AC | PRN
  Administered 2017-04-30: 28 via INTRAVENOUS

## 2017-04-30 NOTE — Addendum Note (Signed)
Addended by: Valora Corporal on: 04/30/2017 03:28 PM   Modules accepted: Orders

## 2017-04-30 NOTE — Telephone Encounter (Addendum)
Spoke with patient and she states that these blood pressures were from yesterday. This morning prior to her stress test her blood pressure was 151/86 with heart rate of 83. Reviewed with patient that we would like her to monitor her blood pressure daily and to keep a log so that we can see how her blood pressures trend over a period of time. Instructed her to check it twice a day and write down and to call back next week with her readings. Advised that we would like it to be 140/80 or lower. She verbalized understanding of our conversation, agreement with plan, and had no further questions at this time.

## 2017-04-30 NOTE — Telephone Encounter (Signed)
Change in schedule and verified with patient for her to arrive tomorrow 05/01/17 at 07:30 AM at [facility] for left heart cath with Dr. [name]. She verbalized understanding with no further questions at this time.

## 2017-04-30 NOTE — Telephone Encounter (Signed)
Spoke with patient and scheduled her for left heart cath tomorrow with Dr. [name] at [facility]. Reviewed all instructions with patient and she verbalized understanding with no further questions at this time. She requested that I send procedure instructions to her mychart so she can review those. Labs ordered and patient going to [facility] to have those done. Mychart message sent and labs entered as well. She verbalized understanding of all instructions, agreement with plan, and had no further questions at this time.

## 2017-05-01 ENCOUNTER — Ambulatory Visit
Admission: RE | Admit: 2017-05-01 | Discharge: 2017-05-01 | Disposition: A | Discharge status: Home / Self Care | Payer: Medicare Other | Source: Ambulatory Visit | Attending: Cardiovascular Disease | Admitting: Cardiovascular Disease

## 2017-05-01 ENCOUNTER — Ambulatory Visit (HOSPITAL_BASED_OUTPATIENT_CLINIC_OR_DEPARTMENT_OTHER)
Admission: RE | Admit: 2017-05-01 | Discharge: 2017-05-01 | Disposition: A | Discharge status: Home / Self Care | Payer: Medicare Other | Source: Ambulatory Visit | Attending: Cardiovascular Disease | Admitting: Cardiovascular Disease

## 2017-05-01 ENCOUNTER — Encounter
Admission: RE | Disposition: A | Discharge status: Home / Self Care | Payer: Self-pay | Source: Ambulatory Visit | Attending: Cardiovascular Disease

## 2017-05-01 ENCOUNTER — Other Ambulatory Visit: Payer: Self-pay | Admitting: Cardiovascular Disease

## 2017-05-01 ENCOUNTER — Encounter: Payer: Self-pay | Admitting: *Deleted

## 2017-05-01 DIAGNOSIS — I340 Nonrheumatic mitral (valve) insufficiency: Secondary | ICD-10-CM | POA: Diagnosis not present

## 2017-05-01 DIAGNOSIS — E119 Type 2 diabetes mellitus without complications: Secondary | ICD-10-CM | POA: Diagnosis not present

## 2017-05-01 DIAGNOSIS — R079 Chest pain, unspecified: Secondary | ICD-10-CM | POA: Insufficient documentation

## 2017-05-01 DIAGNOSIS — E785 Hyperlipidemia, unspecified: Secondary | ICD-10-CM | POA: Diagnosis not present

## 2017-05-01 DIAGNOSIS — R9439 Abnormal result of other cardiovascular function study: Secondary | ICD-10-CM | POA: Diagnosis not present

## 2017-05-01 DIAGNOSIS — K219 Gastro-esophageal reflux disease without esophagitis: Secondary | ICD-10-CM | POA: Diagnosis not present

## 2017-05-01 DIAGNOSIS — I10 Essential (primary) hypertension: Secondary | ICD-10-CM | POA: Diagnosis present

## 2017-05-01 DIAGNOSIS — Z87891 Personal history of nicotine dependence: Secondary | ICD-10-CM | POA: Diagnosis not present

## 2017-05-01 DIAGNOSIS — I200 Unstable angina: Secondary | ICD-10-CM

## 2017-05-01 DIAGNOSIS — R9431 Abnormal electrocardiogram [ECG] [EKG]: Secondary | ICD-10-CM | POA: Diagnosis present

## 2017-05-01 HISTORY — DX: Hyperlipidemia, unspecified: E78.5

## 2017-05-01 HISTORY — PX: LEFT HEART CATH AND CORONARY ANGIOGRAPHY: CATH118249

## 2017-05-01 HISTORY — DX: Restless legs syndrome: G25.81

## 2017-05-01 LAB — ECHOCARDIOGRAM LIMITED: WEIGHTICAEL: 2608 [oz_av]

## 2017-05-01 LAB — GLUCOSE, CAPILLARY: Glucose-Capillary: 100 mg/dL — ABNORMAL HIGH (ref 65–99)

## 2017-05-01 SURGERY — LEFT HEART CATH AND CORONARY ANGIOGRAPHY
Anesthesia: Moderate Sedation

## 2017-05-01 SURGERY — LEFT HEART CATH
Anesthesia: Moderate Sedation

## 2017-05-01 MED ORDER — ACETAMINOPHEN 325 MG PO TABS: 650.0000 mg | ORAL_TABLET | ORAL | Status: DC | PRN

## 2017-05-01 MED ORDER — SODIUM CHLORIDE 0.9% FLUSH: 3.0000 mL | Freq: Two times a day (BID) | INTRAVENOUS | Status: DC

## 2017-05-01 MED ORDER — ASPIRIN 81 MG PO CHEW: 81.0000 mg | CHEWABLE_TABLET | ORAL | Status: DC

## 2017-05-01 MED ORDER — OLMESARTAN MEDOXOMIL 40 MG PO TABS: 40.0000 mg | ORAL_TABLET | Freq: Every day | ORAL | 2 refills | Status: DC

## 2017-05-01 MED ORDER — SODIUM CHLORIDE 0.9 % WEIGHT BASED INFUSION: 1.0000 mL/kg/h | INTRAVENOUS | Status: DC

## 2017-05-01 MED ORDER — FENTANYL CITRATE (PF) 100 MCG/2ML IJ SOLN
INTRAMUSCULAR | Status: DC | PRN
  Administered 2017-05-01 (×2): 25 ug via INTRAVENOUS

## 2017-05-01 MED ORDER — MIDAZOLAM HCL 2 MG/2ML IJ SOLN
INTRAMUSCULAR | Status: DC | PRN
  Administered 2017-05-01 (×2): 1 mg via INTRAVENOUS

## 2017-05-01 MED ORDER — SODIUM CHLORIDE 0.9 % WEIGHT BASED INFUSION
3.0000 mL/kg/h | INTRAVENOUS | Status: AC
  Administered 2017-05-01: 3 mL/kg/h via INTRAVENOUS

## 2017-05-01 MED ORDER — CARVEDILOL 25 MG PO TABS: 25.0000 mg | ORAL_TABLET | Freq: Two times a day (BID) | ORAL | 2 refills | Status: DC

## 2017-05-01 MED ORDER — METOPROLOL TARTRATE 5 MG/5ML IV SOLN
INTRAVENOUS | Status: AC
  Filled 2017-05-01: qty 5

## 2017-05-01 MED ORDER — FENTANYL CITRATE (PF) 100 MCG/2ML IJ SOLN
INTRAMUSCULAR | Status: AC
  Filled 2017-05-01: qty 2

## 2017-05-01 MED ORDER — ONDANSETRON HCL 4 MG/2ML IJ SOLN: 4.0000 mg | Freq: Four times a day (QID) | INTRAMUSCULAR | Status: DC | PRN

## 2017-05-01 MED ORDER — LIDOCAINE HCL (PF) 1 % IJ SOLN
INTRAMUSCULAR | Status: AC
  Filled 2017-05-01: qty 30

## 2017-05-01 MED ORDER — METOPROLOL TARTRATE 5 MG/5ML IV SOLN
INTRAVENOUS | Status: DC | PRN
  Administered 2017-05-01: 5 mg via INTRAVENOUS

## 2017-05-01 MED ORDER — HEPARIN (PORCINE) IN NACL 2-0.9 UNIT/ML-% IJ SOLN
INTRAMUSCULAR | Status: AC
  Filled 2017-05-01: qty 1000

## 2017-05-01 MED ORDER — MIDAZOLAM HCL 2 MG/2ML IJ SOLN
INTRAMUSCULAR | Status: AC
  Filled 2017-05-01: qty 2

## 2017-05-01 MED ORDER — SODIUM CHLORIDE 0.9% FLUSH: 3.0000 mL | INTRAVENOUS | Status: DC | PRN

## 2017-05-01 MED ORDER — LIDOCAINE HCL (PF) 1 % IJ SOLN
INTRAMUSCULAR | Status: DC | PRN
  Administered 2017-05-01: 15 mL

## 2017-05-01 MED ORDER — SODIUM CHLORIDE 0.9 % IV SOLN: 250.0000 mL | INTRAVENOUS | Status: DC | PRN

## 2017-05-01 SURGICAL SUPPLY — 10 items
CATH INFINITI 5FR ANG PIGTAIL (CATHETERS) ×3 IMPLANT
CATH INFINITI 5FR JL4 (CATHETERS) ×3 IMPLANT
CATH INFINITI JR4 5F (CATHETERS) ×3 IMPLANT
DEVICE CLOSURE MYNXGRIP 5F (Vascular Products) ×3 IMPLANT
KIT MANI 3VAL PERCEP (MISCELLANEOUS) ×3 IMPLANT
NEEDLE PERC 18GX7CM (NEEDLE) ×3 IMPLANT
NEEDLE SMART REG 18GX2-3/4 (NEEDLE) ×3 IMPLANT
PACK CARDIAC CATH (CUSTOM PROCEDURE TRAY) ×3 IMPLANT
SHEATH AVANTI 5FR X 11CM (SHEATH) ×3 IMPLANT
WIRE EMERALD 3MM-J .035X150CM (WIRE) ×3 IMPLANT

## 2017-05-01 NOTE — Progress Notes (Signed)
*  PRELIMINARY RESULTS* Echocardiogram 2D Echocardiogram has been performed.  Alyssa Lawson 05/01/2017, 10:53 AM

## 2017-05-01 NOTE — Progress Notes (Signed)
   Cardiac cath today for ABN stress test, smoker, abn ekg, hyperlipdiemia, chest pain/SOB  Final Conclusions:  No significant CAD Normal EF False positive stress test  Significant MR noted on LV gram Metoprolol given 5 mg IV x 1 for atrial tachycardia after the case, converted back to NSR BP elevated throughout the case  Recommendations:  --We have ordered an echocardiogram to evaluate mitral valve regurgitation --Increase coreg up to 25 mg po BID for atrial tachycardia and HTN Increase benicar up to 40 mg daily Talked with patient, we will send medication to pharmacy D/C today to home  Signed, Esmond Plants, MD, Ph.D [facility]

## 2017-05-02 NOTE — H&P (Signed)
H&P Addendum, precardiac catheterization  Patient was seen and evaluated prior to Cardiac catheterization procedure Symptoms, prior testing details again confirmed with the patient Patient examined, no significant change from prior exam Lab work reviewed in detail personally by myself Patient understands risk and benefit of the procedure, willing to proceed  Signed, Tim Gollan, MD, Ph.D [facility]    

## 2017-05-14 ENCOUNTER — Other Ambulatory Visit: Payer: Self-pay | Admitting: Family Medicine

## 2017-05-19 ENCOUNTER — Encounter: Payer: Self-pay | Admitting: Physician Assistant

## 2017-05-20 ENCOUNTER — Ambulatory Visit (INDEPENDENT_AMBULATORY_CARE_PROVIDER_SITE_OTHER): Payer: Medicare Other | Admitting: Physician Assistant

## 2017-05-20 ENCOUNTER — Encounter: Payer: Self-pay | Admitting: Physician Assistant

## 2017-05-20 VITALS — BP 178/96 | HR 88 | Ht 65.0 in | Wt 165.2 lb

## 2017-05-20 DIAGNOSIS — I2510 Atherosclerotic heart disease of native coronary artery without angina pectoris: Secondary | ICD-10-CM

## 2017-05-20 DIAGNOSIS — I200 Unstable angina: Secondary | ICD-10-CM

## 2017-05-20 DIAGNOSIS — I340 Nonrheumatic mitral (valve) insufficiency: Secondary | ICD-10-CM | POA: Diagnosis not present

## 2017-05-20 DIAGNOSIS — E782 Mixed hyperlipidemia: Secondary | ICD-10-CM

## 2017-05-20 DIAGNOSIS — I10 Essential (primary) hypertension: Secondary | ICD-10-CM

## 2017-05-20 MED ORDER — ISOSORBIDE MONONITRATE ER 30 MG PO TB24: 30.0000 mg | ORAL_TABLET | Freq: Every day | ORAL | 3 refills | Status: DC

## 2017-05-20 NOTE — Patient Instructions (Signed)
Medication Instructions:  Your physician has recommended you make the following change in your medication:  START taking imdur 30mg  once daily    Labwork: none  Testing/Procedures: none  Follow-Up: Your physician recommends that you schedule a follow-up appointment in: 3 months with Dr. [name].    Any Other Special Instructions Will Be Listed Below (If Applicable). BP check in one week. Please bring your cuff for comparison     If you need a refill on your cardiac medications before your next appointment, please call your pharmacy.

## 2017-05-20 NOTE — Progress Notes (Signed)
Cardiology Office Note Date:  05/20/2017  Patient ID:  Alyssa Lawson, Alyssa Lawson Jul 18, 1945, MRN [id number] PCP:  Steele Sizer, MD  Cardiologist:  Dr. [name], MD    Chief Complaint: Follow up diagnostic LHC  History of Present Illness: Alyssa Lawson is a 72 y.o. female with history of nonobstructive CAD by [facility] 16/0/7371, metabolic syndrome, prior tobacco abuse for 30 years at < 1 ppd quitting in 1998, GERD, chronic back pain, RLS, and urinary incontinence who presents for follow up of diagnostic LHC on 05/01/2017.   Patient was initially evaluated by Dr. [name] on 04/24/2017 for chest pain and abnormal EKG. She reported a prior echo in 2012 and a treadmill stress test many years prior without results available for review. At her visit on 11/2, noted vague symptoms of "gas" when eating the wrong foods. She noted these symptoms resolved with belching. She underwent treadmill Myoview on 04/30/2017 that shwoed a large region of ischemia in the mid to apical anteroseptal wall and entire lateral wall, EF 63%, No EKG changes concerning for ischemia at peak stress or recovery. Poor exercise tolerance was noted with the patient being able to exercise for only 3 minutes and 30 seconds. GI uptake artifact was noted. This was a high-risk scan. She underwent diagnostic LHC on 05/01/2017 that showed left main without significant disease, LAD without significant disease, LCx without significant disease, RCA without significant disease. LVEF 55-65%. Moderate mitral regurgitation was noted in the setting of ectopy (atrial tachycardia was noted after the case was complete that resolved with IV metoprolol 5 mg x 1). BP was also noted to be elevated throughout the case. This showed her stress test was a false positive. She underwent echo on 05/01/2017 that showed EF 55-60%, no RWMA, not technically sufficient to allow for LV diastolic function, mild to moderate mitral regurgitation, mildly dilated left atrium, RV systolic  function normal, PASP 35 mmHg, small to moderate pericardial effusion was noted. Her Coreg and Benicar were titrated.  She comes in doing well today. No chest pain, SOB, palpitations, diaphoresis, nausea, vomiting, dizziness, presyncope, or syncope. Blood pressure readings at home with her BP cuff have been in the [id number] systolic over 54-62 diastolic. Heart rates in the 70-80 bpm range. Much improved belching. Now eating less and not as late at nighttime. No issues with her cardiac cath site. No drop off in energy. Tolerating Coreg 25 mg bid and Benicar 40 mg daily. Previously on amlodipine though this was stopped 2/2 lower extremity swelling. Prior to being on Benicar she was on losartan, change was made 2/2 prescription recall earlier this year. Has been on HCTZ previously as well, though this was stopped 2/2 her urinary incontinence. She does not have any complaints at this time.    Past Medical History:  Diagnosis Date  . Bladder disorder    a. urinary incontinence  . Coronary artery disease, non-occlusive    a. LHC 05/01/2017: LM no sig dz, LAD no sig dz, LCx no sig dz, RCA no sig dz, EF 55-65%, mod MR  . Diabetes mellitus ([place])   . Endometriosis   . GERD (gastroesophageal reflux disease)   . Hyperlipidemia   . Hypertension   . Mitral regurgitation    a. TTE 05/01/2017: EF 55-60%, no RWMA, not technically sufficient to allow for LV diastolic function, mild to moderate mitral regurgitation, mildly dilated left atrium, RV systolic function normal, PASP 35 mmHg, small to moderate pericardial effusion was noted  . Restless leg syndrome  Past Surgical History:  Procedure Laterality Date  . ABDOMINAL HYSTERECTOMY  1976  . LEFT HEART CATH AND CORONARY ANGIOGRAPHY N/A 05/01/2017   Procedure: LEFT HEART CATH AND CORONARY ANGIOGRAPHY;  Surgeon: Minna Merritts, MD;  Location: [facility];  Service: Cardiovascular;  Laterality: N/A;  . OOPHORECTOMY      Current Meds  Medication  Sig  . acetaminophen (TYLENOL) 500 MG tablet Take 1 tablet (500 mg total) by mouth every 6 (six) hours as needed for headache.  Marland Kitchen aspirin 81 MG chewable tablet Chew 1 tablet by mouth daily.  Marland Kitchen atorvastatin (LIPITOR) 40 MG tablet Take 1 tablet (40 mg total) by mouth daily.  . carvedilol (COREG) 25 MG tablet Take 1 tablet (25 mg total) 2 (two) times daily by mouth.  . Dulaglutide (TRULICITY) 1.5 QQ/7.6PP SOPN Inject 1.5 mg into the skin once a week.  . ferrous sulfate 325 (65 FE) MG tablet Take 325 mg by mouth daily with breakfast.  . fluticasone (FLONASE) 50 MCG/ACT nasal spray USE 1 SPRAY IN EACH NOSTRIL TWO TIMES DAILY  . Magnesium Oxide 400 (240 MG) MG TABS Take 1 tablet by mouth daily.  . meloxicam (MOBIC) 15 MG tablet Take 1 tablet by mouth daily.  . methocarbamol (ROBAXIN) 500 MG tablet Take 1 tablet by mouth 2 (two) times daily.  . Multiple Vitamin (MULTIVITAMIN) capsule Take 1 capsule by mouth daily.  Marland Kitchen olmesartan (BENICAR) 40 MG tablet Take 1 tablet (40 mg total) daily by mouth.  . oxybutynin (DITROPAN) 5 MG tablet TAKE 1 TABLET BY MOUTH TWO  TIMES DAILY  . rOPINIRole (REQUIP) 2 MG tablet Take 1 tablet (2 mg total) by mouth at bedtime.  . valACYclovir (VALTREX) 500 MG tablet TAKE 1 TABLET BY MOUTH 2  TIMES DAILY. PER EPISODE AS NEEDED, TAKE FOR 10 DAYS (Patient taking differently: TAKE 1 TABLET BY MOUTH 2  TIMES DAILY AS NEEDED. PER EPISODE AS NEEDED, TAKE FOR 10 DAYS)    Allergies:   Amlodipine   Social History:  The patient  reports that she quit smoking about 21 years ago. Her smoking use included cigarettes. She started smoking about 60 years ago. She has a 9.00 pack-year smoking history. She quit smokeless tobacco use about 20 years ago. She reports that she drinks alcohol. She reports that she does not use drugs.   Family History:  The patient's family history includes Arrhythmia in her father and sister; Cardiomyopathy in her sister; Congestive Heart Failure in her brother;  Diabetes in her brother; Heart failure in her father; Hypertension in her brother, father, and sister.  ROS:   Review of Systems  Constitutional: Negative for chills, diaphoresis, fever, malaise/fatigue and weight loss.  HENT: Negative for congestion.   Eyes: Negative for discharge and redness.  Respiratory: Negative for cough, hemoptysis, sputum production, shortness of breath and wheezing.   Cardiovascular: Negative for chest pain, palpitations, orthopnea, claudication, leg swelling and PND.  Gastrointestinal: Negative for abdominal pain, blood in stool, heartburn, melena, nausea and vomiting.  Genitourinary: Negative for hematuria.  Musculoskeletal: Negative for falls and myalgias.  Skin: Negative for rash.  Neurological: Negative for dizziness, tingling, tremors, sensory change, speech change, focal weakness, loss of consciousness and weakness.  Endo/Heme/Allergies: Does not bruise/bleed easily.  Psychiatric/Behavioral: Negative for substance abuse. The patient is not nervous/anxious.   All other systems reviewed and are negative.    PHYSICAL EXAM:  VS:  BP (!) 178/96 (BP Location: Left Arm, Patient Position: Sitting, Cuff Size: Normal)  Pulse 88   Ht 5\' 5"  (1.651 m)   Wt 165 lb 4 oz (75 kg)   BMI 27.50 kg/m  BMI: Body mass index is 27.5 kg/m.  Physical Exam  Constitutional: She is oriented to person, place, and time. She appears well-developed and well-nourished.  HENT:  Head: Normocephalic and atraumatic.  Eyes: Right eye exhibits no discharge. Left eye exhibits no discharge.  Neck: Normal range of motion. No JVD present.  Cardiovascular: Normal rate, regular rhythm, S1 normal and S2 normal. Exam reveals no distant heart sounds, no friction rub, no midsystolic click and no opening snap.  Murmur heard. High-pitched blowing holosystolic murmur is present with a grade of 1/6 at the apex. Pulses:      Posterior tibial pulses are 2+ on the right side, and 2+ on the left side.    Right femoral cardiac cath site well healed without bleeding, bruising, swelling, erythema, warmth, or TTP. No bruit.   Pulmonary/Chest: Effort normal and breath sounds normal. No respiratory distress. She has no decreased breath sounds. She has no wheezes. She has no rales. She exhibits no tenderness.  Abdominal: Soft. She exhibits no distension. There is no tenderness.  Musculoskeletal: She exhibits no edema.  Neurological: She is alert and oriented to person, place, and time.  Skin: Skin is warm and dry. No cyanosis. Nails show no clubbing.  Psychiatric: She has a normal mood and affect. Her speech is normal and behavior is normal. Judgment and thought content normal.     EKG:  Was ordered and interpreted by me today. Shows NSR, 88 bpm, left axis deviation, inferolateral TWI (old)  Recent Labs: 02/25/2017: ALT 22; TSH 1.15 04/30/2017: BUN 24; Creatinine, Ser 0.94; Hemoglobin 13.2; Platelets 267; Potassium 4.1; Sodium 140  08/29/2016: Cholesterol 164; HDL 81; LDL Cholesterol 69; Total CHOL/HDL Ratio 2.0; Triglycerides 69; VLDL 14   Estimated Creatinine Clearance: 55.6 mL/min (by C-G formula based on SCr of 0.94 mg/dL).   Wt Readings from Last 3 Encounters:  05/20/17 165 lb 4 oz (75 kg)  05/01/17 163 lb (73.9 kg)  04/24/17 165 lb 12 oz (75.2 kg)     Other studies reviewed: Additional studies/records reviewed today include: summarized above  ASSESSMENT AND PLAN:  1. Nonobstructive CAD without angina: No symptoms concerning for angina. LHC 05/01/2017 without significant CAD. Continue aggressive risk factor modification. Continue ASA 81 mg daily. No plans for further ischemic evaluation at this time.   2. Mitral regurgitation: Mild to moderate mitral regurgitation noted on echo 05/01/2017. Asymptomatic. Continue to follow clinically and with periodic echocardiograms.   3. Paroxysmal atrial tachycardia: No symptoms of palpitations. Recent tsh normal. Potassium at goal. Coreg 25 mg bid as  below. If she develops palpitations, would recommend outpatient cardiac monitor to evaluate for ectopy burden.   4. HTN: Blood pressure not well controlled today with systolic readings in the 557D mmHg. Blood pressure readings at home have been in the 220-254 systolic over 27-06 diastolic. She will bring in her BP cuff for an RN visit on 2 weeks to compare to ours for accuracy. If her BP cuff is noted to be accurate, she likely has a component of white-coat hypertension. For now, add Imdur 30 mg daily in an effort to improve her BP readings further. She is intolerant of amlodipine as noted in the HPI. She also has a difficult time with diuretic therapy given her urinary incontinence. She prefers to take medications once daily, therefore we will avoid hydralazine for now. Continue Coreg  25 mg bid and Benicar 40 mg daily.   5. HLD: LDL 69 in 08/2016. On Lipitor 40 mg daily. Managed by PCP.   6. DM2: Per PCP.   Disposition: F/u with RN in 2 weeks and myself in 3 months.   Current medicines are reviewed at length with the patient today.  The patient did not have any concerns regarding medicines.  Melvern Banker PA-C 05/20/2017 2:23 PM     [address] [place], [place] [address] [phone]

## 2017-05-25 ENCOUNTER — Encounter: Payer: Self-pay | Admitting: Family Medicine

## 2017-05-27 ENCOUNTER — Ambulatory Visit: Payer: Medicare Other

## 2017-05-27 ENCOUNTER — Encounter: Payer: Self-pay | Admitting: Family Medicine

## 2017-05-29 ENCOUNTER — Ambulatory Visit: Payer: Medicare Other

## 2017-06-03 ENCOUNTER — Other Ambulatory Visit: Payer: Self-pay

## 2017-06-03 ENCOUNTER — Ambulatory Visit (INDEPENDENT_AMBULATORY_CARE_PROVIDER_SITE_OTHER): Payer: Medicare Other

## 2017-06-03 VITALS — BP 170/96 | HR 63 | Resp 16

## 2017-06-03 DIAGNOSIS — I10 Essential (primary) hypertension: Secondary | ICD-10-CM

## 2017-06-03 MED ORDER — HYDRALAZINE HCL 50 MG PO TABS
50.0000 mg | ORAL_TABLET | Freq: Three times a day (TID) | ORAL | 0 refills | Status: DC
Start: 2017-06-03 — End: 2017-07-07

## 2017-06-03 NOTE — Patient Instructions (Signed)
1.) Reason for visit: BP check  2.) Name of MD requesting visit: Christell Faith, PA-C  3.) H&P: Pt with hx of HTN, taking olmesartan, imdur and coreg. BP elevated at November 28 OV. Pt stated BP checks at home are not elevated. Imdur 30mg  qd prescribed.   4.) ROS related to problem: Pt states today that BP readings have been high at home. Reviewed medication list for which pt reports being compliant.   5.) Assessment and plan per MD: BP 170/96, HR 63 manual check. She then used her home automatic cuff and BP 181/108. Reviewed with Christell Faith, PA-C who advised hydralazine 50mg  TID with BP recheck in one week. Pt will pick up medication at [place], [place]. First dose tonight and continue to monitor pressures at home.

## 2017-06-09 ENCOUNTER — Ambulatory Visit
Admission: RE | Admit: 2017-06-09 | Discharge: 2017-06-09 | Disposition: A | Discharge status: Home / Self Care | Payer: Medicare Other | Source: Ambulatory Visit | Attending: Family Medicine | Admitting: Family Medicine

## 2017-06-09 DIAGNOSIS — Z1231 Encounter for screening mammogram for malignant neoplasm of breast: Secondary | ICD-10-CM | POA: Diagnosis not present

## 2017-06-09 HISTORY — DX: Malignant (primary) neoplasm, unspecified: C80.1

## 2017-06-11 ENCOUNTER — Telehealth: Payer: Self-pay | Admitting: Nurse Practitioner

## 2017-06-11 ENCOUNTER — Ambulatory Visit (INDEPENDENT_AMBULATORY_CARE_PROVIDER_SITE_OTHER): Payer: Medicare Other

## 2017-06-11 ENCOUNTER — Other Ambulatory Visit: Payer: Self-pay

## 2017-06-11 VITALS — BP 150/88 | HR 87 | Resp 16

## 2017-06-11 DIAGNOSIS — I10 Essential (primary) hypertension: Secondary | ICD-10-CM

## 2017-06-11 NOTE — Patient Instructions (Signed)
1.) Reason for visit: BP check  2.) Name of MD requesting visit: Christell Faith, PA-C  3.) H&P: Pt with history of HTN taking imdur, coreg, olmesartan and hydralazine 50mg  TID (added November 28)  ROS related to problem: BP elevated at 11/28 OV. Imdur added, BP check last week (170/96), hydralazine 50mg  TID added. Pt reports taking all medications as advised. She took BP meds at 8am today. Reported SBP 147 at home today before medications .  4.) Assessment and plan per MD: Pt will continue medications as scheduled while awaiting further instructions from NP/PA. Routed to Ignacia Bayley, NP.

## 2017-06-11 NOTE — Telephone Encounter (Signed)
BP check/nurse visit reviewed.  BP 150/88 @ visit.  Please contact pt and have her increase hydralazine to 75 mg TID.  Please have her cont to follow bp @ home and call us next week with recordings.

## 2017-06-11 NOTE — Telephone Encounter (Signed)
Pt reports BP 110/65 at home today after taking afternoon hydralazine.  She brought BP cuff in last week for comparison. Home cuff and manual reading in office were similar. Pt will monitor BP 2 hours after medications x 1 week and call 12/26  with readings. Given her home reading, she would like to continue hydralazine 50mg  TID. She understands if SBP >140 she should take 75mg  hydralazine. Pt agreeable w/plan.

## 2017-06-12 NOTE — Telephone Encounter (Signed)
No answer. Left message to call back.   

## 2017-06-12 NOTE — Telephone Encounter (Signed)
Patient calling to give BP Readings   12/21  830 am  164/90     147/80        141/80  Took Medication at 845 am   0930 am 133/80

## 2017-06-22 ENCOUNTER — Encounter: Payer: Self-pay | Admitting: Family Medicine

## 2017-06-24 ENCOUNTER — Other Ambulatory Visit: Payer: Self-pay

## 2017-06-24 DIAGNOSIS — G2581 Restless legs syndrome: Secondary | ICD-10-CM

## 2017-06-24 DIAGNOSIS — I10 Essential (primary) hypertension: Secondary | ICD-10-CM

## 2017-06-24 DIAGNOSIS — E8881 Metabolic syndrome: Secondary | ICD-10-CM

## 2017-06-24 DIAGNOSIS — E785 Hyperlipidemia, unspecified: Secondary | ICD-10-CM

## 2017-06-24 MED ORDER — MELOXICAM 15 MG PO TABS: 15.0000 mg | ORAL_TABLET | Freq: Every day | ORAL | 0 refills | Status: DC

## 2017-06-24 MED ORDER — OLMESARTAN MEDOXOMIL 40 MG PO TABS: 40.0000 mg | ORAL_TABLET | Freq: Every day | ORAL | 1 refills | Status: DC

## 2017-06-24 MED ORDER — ATORVASTATIN CALCIUM 40 MG PO TABS: 40.0000 mg | ORAL_TABLET | Freq: Every day | ORAL | 1 refills | Status: DC

## 2017-06-24 MED ORDER — FLUTICASONE PROPIONATE 50 MCG/ACT NA SUSP: NASAL | 1 refills | Status: DC

## 2017-06-24 MED ORDER — ROPINIROLE HCL 2 MG PO TABS: 2.0000 mg | ORAL_TABLET | Freq: Every day | ORAL | 1 refills | Status: DC

## 2017-06-24 MED ORDER — CARVEDILOL 25 MG PO TABS: 25.0000 mg | ORAL_TABLET | Freq: Two times a day (BID) | ORAL | 1 refills | Status: DC

## 2017-06-24 MED ORDER — OXYBUTYNIN CHLORIDE 5 MG PO TABS: 5.0000 mg | ORAL_TABLET | Freq: Two times a day (BID) | ORAL | 1 refills | Status: DC

## 2017-06-24 MED ORDER — DULAGLUTIDE 1.5 MG/0.5ML ~~LOC~~ SOAJ: 1.5000 mg | SUBCUTANEOUS | 1 refills | Status: DC

## 2017-06-24 NOTE — Telephone Encounter (Signed)
Hypertension medication request: Olmesartan to [facility].   Last office visit pertaining to hypertension:  BP Readings from Last 3 Encounters:  06/11/17 (!) 150/88  06/03/17 (!) 170/96  05/20/17 (!) 178/96    Lab Results  Component Value Date   CREATININE 0.94 04/30/2017   BUN 24 (H) 04/30/2017   NA 140 04/30/2017   K 4.1 04/30/2017   CL 106 04/30/2017   CO2 24 04/30/2017     Follow up scheduled for: 08/12/2017

## 2017-06-26 ENCOUNTER — Telehealth: Payer: Self-pay | Admitting: Physician Assistant

## 2017-06-26 ENCOUNTER — Encounter: Payer: Self-pay | Admitting: Cardiovascular Disease

## 2017-06-26 ENCOUNTER — Other Ambulatory Visit: Payer: Self-pay | Admitting: Family Medicine

## 2017-06-26 NOTE — Telephone Encounter (Signed)
Per MyChart message: "06/17/2017  10am 130/74  73  12:20pm 134/78 69  5:15pm 115/67 81   12/27/20188am 145/83 73  9am 125/75 81  5:30pm 116/68 89  8:45pm 150/84 74  ate a small piece of country ham  8:55 129/83 74   06/20/2017  9:50am 130/76 74  2:40pm 123/70 75   06/21/2017 9:30 am 129/73 83  Alyssa Lawson I got lazy I am monitoring but not putting it down. Will certainly let Dr. [name], Thurmond Butts, or you know if something is not quite right.   Pt understands to continue to monitor and call if SBP >140 consistently.

## 2017-06-26 NOTE — Telephone Encounter (Signed)
Pt sent MyChart message asking if we received BP readings that she sent to [place].  I s/w pt to let her know we did not receive these. She thought she sent them through [place] but will send now. Her BP has improved while taking coreg 25mg  BID, isosorbide 30mg  qd, olmesartan 40mg  qd and hydralazine 50mg  TID. She did not increase hydralazine to 75mg  TID as BP improved on 50mg . Reports feeling well. Awaiting BP readings for further advice.

## 2017-06-30 ENCOUNTER — Other Ambulatory Visit: Payer: Self-pay | Admitting: Family Medicine

## 2017-06-30 NOTE — Telephone Encounter (Signed)
Copied from [place] [phone]. Topic: Quick Communication - Rx Refill/Question >> Jun 30, 2017  9:08 AM Synthia Innocent wrote: Has the patient contacted their pharmacy? Yes.     (Agent: If no, request that the patient contact the pharmacy for the refill.)   Preferred Pharmacy (with phone number or street name): [facility]: Please be advised that RX refills may take up to 3 business days. We ask that you follow-up with your pharmacy. Requesting refill on methocarbamol (ROBAXIN) 500 MG tablet

## 2017-07-06 ENCOUNTER — Other Ambulatory Visit: Payer: Self-pay | Admitting: *Deleted

## 2017-07-06 ENCOUNTER — Encounter: Payer: Self-pay | Admitting: Cardiovascular Disease

## 2017-07-07 ENCOUNTER — Other Ambulatory Visit: Payer: Self-pay | Admitting: *Deleted

## 2017-07-07 MED ORDER — HYDRALAZINE HCL 50 MG PO TABS: 50.0000 mg | ORAL_TABLET | Freq: Three times a day (TID) | ORAL | 0 refills | Status: DC

## 2017-07-07 MED ORDER — ISOSORBIDE MONONITRATE ER 30 MG PO TB24: 30.0000 mg | ORAL_TABLET | Freq: Every day | ORAL | 3 refills | Status: DC

## 2017-08-10 ENCOUNTER — Encounter: Payer: Self-pay | Admitting: Cardiovascular Disease

## 2017-08-10 ENCOUNTER — Other Ambulatory Visit: Payer: Self-pay | Admitting: *Deleted

## 2017-08-10 MED ORDER — HYDRALAZINE HCL 50 MG PO TABS: 50.0000 mg | ORAL_TABLET | Freq: Three times a day (TID) | ORAL | 3 refills | Status: DC

## 2017-08-12 ENCOUNTER — Ambulatory Visit: Payer: Medicare Other | Admitting: Family Medicine

## 2017-08-20 ENCOUNTER — Ambulatory Visit: Payer: Medicare Other | Admitting: Cardiovascular Disease

## 2017-08-21 ENCOUNTER — Telehealth: Payer: Self-pay

## 2017-08-21 ENCOUNTER — Encounter: Payer: Self-pay | Admitting: Family Medicine

## 2017-08-21 ENCOUNTER — Ambulatory Visit (INDEPENDENT_AMBULATORY_CARE_PROVIDER_SITE_OTHER): Payer: PRIVATE HEALTH INSURANCE | Admitting: Family Medicine

## 2017-08-21 VITALS — BP 134/68 | HR 81 | Temp 98.2°F | Resp 16 | Ht 65.0 in | Wt 162.6 lb

## 2017-08-21 DIAGNOSIS — G2581 Restless legs syndrome: Secondary | ICD-10-CM | POA: Diagnosis not present

## 2017-08-21 DIAGNOSIS — M545 Low back pain: Secondary | ICD-10-CM

## 2017-08-21 DIAGNOSIS — M25541 Pain in joints of right hand: Secondary | ICD-10-CM | POA: Diagnosis not present

## 2017-08-21 DIAGNOSIS — N3941 Urge incontinence: Secondary | ICD-10-CM

## 2017-08-21 DIAGNOSIS — E785 Hyperlipidemia, unspecified: Secondary | ICD-10-CM

## 2017-08-21 DIAGNOSIS — R062 Wheezing: Secondary | ICD-10-CM

## 2017-08-21 DIAGNOSIS — I10 Essential (primary) hypertension: Secondary | ICD-10-CM

## 2017-08-21 DIAGNOSIS — G8929 Other chronic pain: Secondary | ICD-10-CM | POA: Diagnosis not present

## 2017-08-21 DIAGNOSIS — M25542 Pain in joints of left hand: Secondary | ICD-10-CM

## 2017-08-21 DIAGNOSIS — E8881 Metabolic syndrome: Secondary | ICD-10-CM | POA: Diagnosis not present

## 2017-08-21 DIAGNOSIS — I200 Unstable angina: Secondary | ICD-10-CM

## 2017-08-21 LAB — POCT GLYCOSYLATED HEMOGLOBIN (HGB A1C): HEMOGLOBIN A1C: 5.9

## 2017-08-21 LAB — POCT UA - MICROALBUMIN: Microalbumin Ur, POC: 20 mg/L

## 2017-08-21 MED ORDER — OXYBUTYNIN CHLORIDE 5 MG PO TABS: 10.0000 mg | ORAL_TABLET | Freq: Two times a day (BID) | ORAL | 1 refills | Status: DC

## 2017-08-21 NOTE — Telephone Encounter (Signed)
Called pt to sched AWV w/ [facility]. LVM requesting returned call.  

## 2017-08-21 NOTE — Progress Notes (Signed)
Name: Alyssa Lawson   MRN: [id number]    DOB: 06/06/1946   Date:08/21/2017       Progress Note  Subjective  Chief Complaint  Chief Complaint  Patient presents with  . Medication Refill  . Metabolic Syndrome    Patient states she is unable to afford Trulicity due to it costing $2,000  . Hypertension    Denies any symptoms  . Dyslipidemia  . RLS  . Back Pain    Is going well with Meloxicam 15 mg and Metocarbamol    HPI  HTN: taking coreg 25mg  daily , hydralazine 50mg  TID, imdur 30mg  daily. No problems or missed doses.   bp is under control, no chest pain, no palpitations, headaches.   Hyperlipidemia: last labs done last March 2018 and she is on Atorvastatin at bedtime. Denies side effects, no myalgias HDL is very good and LDL at goal- will redraw today. Diet- egg whites, baked potatoes, veggie soups chicken, rice, salmon, green beans, pork chop- bakes most foods and cooks vs. Eats out.   Metabolic Syndrome: Today it is 5.9%. hgbA1C 5 months ago was down to 5.6% she denies polyphagia or polydipsia, she has urinary frequency and has lost 3 pounds. She stopped taking metformin due to pt c/o swelling and weight gain with medication. Pt was tarted Trulicity back in 57/3220 and is doing well but notes price is too hight. She has been doing better with her diet and will continue to work hard and life style medication and will stop taking trulicity and will re-check next time.    Urinary incontinence: she has been on Ditropan for many years, but symptoms got worse, so we switched to [facility] however she states too expensive and we switched to Oxybutin and is working but gets up a few times in the night. Pt stopped drinking tea at night. Will increase Oxybutin dose.   RLS: better with medication, sleeping well with medication,- Requip - stillvery seldom has symptoms.   Chronic Back Pain: she has daily low back pain, She takes Tylenol prn, mobic and also methocarbamol,  She left her job back  in May 2018 - and is doing better states muscle relaxer is working well.   Wheezing: resolved, off Breo, remote history of tobacco use, no wheezing, cough or SOB since last episode of bronchitis back in July 2018. Pt uses flonase BID sts feels much better has mild nasal congestion during the winter but its clearing up.   Rotator cuff tendinitis left shoulder: doing well since seen by Dr. Sabra Heck pt received a shot without relief of symptoms but the meloxicam and robaxin BID has been helping with shoulder pain.   Unstable angina: indigestion symptoms with abnormal EKG, normal cardiac cath done Nov 2018 by Dr. [name], on higher dose of Coreg, and Benicar also on Imdur and indigestion improved. Also on statin therapy and aspirin   Patient Active Problem List   Diagnosis Date Noted  . Unstable angina ([place]) 05/01/2017  . Positive cardiac stress test 05/01/2017  . Chest pain 04/24/2017  . Abnormal EKG 04/24/2017  . Smoking history 04/24/2017  . Rotator cuff tendinitis, left 04/10/2017  . Cataracts, bilateral 11/25/2016  . Impingement syndrome of shoulder, left 10/13/2016  . Chronic bilateral low back pain without sciatica 08/20/2016  . Metabolic syndrome 25/42/7062  . Mitral regurgitation 06/22/2015  . Melasma 12/21/2014  . History of hysterectomy 12/21/2014  . Restless leg syndrome 12/21/2014  . Benign essential HTN 12/17/2014  . Cataract 12/17/2014  .  Dyslipidemia 12/17/2014  . Gastric reflux 12/17/2014  . History of cervical cancer 12/17/2014  . H/O iron deficiency anemia 12/17/2014  . Urinary incontinence in female 12/17/2014  . Dysmetabolic syndrome 81/06/7508  . TI (tricuspid incompetence) 12/17/2014  . Osteopenia of the elderly 12/17/2014    Past Surgical History:  Procedure Laterality Date  . ABDOMINAL HYSTERECTOMY  1976  . LEFT HEART CATH AND CORONARY ANGIOGRAPHY N/A 05/01/2017   Procedure: LEFT HEART CATH AND CORONARY ANGIOGRAPHY;  Surgeon: Minna Merritts, MD;   Location: [place] [facility];  Service: Cardiovascular;  Laterality: N/A;  . OOPHORECTOMY      Family History  Problem Relation Age of Onset  . Heart failure Father   . Arrhythmia Father   . Hypertension Father   . Arrhythmia Sister   . Hypertension Sister   . Congestive Heart Failure Brother   . Hypertension Brother   . Cardiomyopathy Sister   . Diabetes Brother   . Breast cancer Paternal Aunt 47    Social History   Socioeconomic History  . Marital status: Married    Spouse name: Not on file  . Number of children: Not on file  . Years of education: Not on file  . Highest education level: Not on file  Social Needs  . Financial resource strain: Not on file  . Food insecurity - worry: Not on file  . Food insecurity - inability: Not on file  . Transportation needs - medical: Not on file  . Transportation needs - non-medical: Not on file  Occupational History  . Not on file  Tobacco Use  . Smoking status: Former Smoker    Packs/day: 0.50    Years: 18.00    Pack years: 9.00    Types: Cigarettes    Start date: 06/23/1956    Last attempt to quit: 11/22/1995    Years since quitting: 21.7  . Smokeless tobacco: Former Systems developer    Quit date: 11/22/1996  Substance and Sexual Activity  . Alcohol use: Yes    Alcohol/week: 0.0 oz    Comment: socially  . Drug use: No  . Sexual activity: Yes    Partners: Male  Other Topics Concern  . 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  acetaminophen (TYLENOL) 500 MG tablet, Take 1 tablet (500 mg total) by mouth every 6 (six) hours as needed for headache., Disp: 30 tablet, Rfl: 0 .  aspirin 81 MG chewable tablet, Chew 1 tablet by mouth daily., Disp: , Rfl:  .  atorvastatin (LIPITOR) 40 MG tablet, Take 1 tablet (40 mg total) by mouth daily., Disp:  90 tablet, Rfl: 1 .  carvedilol (COREG) 25 MG tablet, Take 1 tablet (25 mg total) by mouth 2 (two) times daily., Disp: 180 tablet, Rfl: 1 .  ferrous sulfate 325 (65 FE) MG tablet, Take 325 mg by mouth daily with breakfast., Disp: , Rfl:  .  fluticasone (FLONASE) 50 MCG/ACT nasal spray, USE 1 SPRAY IN EACH NOSTRIL TWO TIMES DAILY, Disp: 48 g, Rfl: 1 .  hydrALAZINE (APRESOLINE) 50 MG tablet, Take 1 tablet (50 mg total) by mouth 3 (three) times daily., Disp: 270 tablet, Rfl: 3 .  isosorbide mononitrate (IMDUR) 30 MG 24 hr tablet, Take 1 tablet (30 mg total) by mouth daily., Disp:  90 tablet, Rfl: 3 .  Magnesium Oxide 400 (240 MG) MG TABS, Take 1 tablet by mouth daily., Disp: , Rfl:  .  meloxicam (MOBIC) 15 MG tablet, Take 1 tablet (15 mg total) by mouth daily., Disp: 90 tablet, Rfl: 0 .  methocarbamol (ROBAXIN) 500 MG tablet, Take 1 tablet by mouth 2 (two) times daily., Disp: , Rfl: 1 .  Multiple Vitamin (MULTIVITAMIN) capsule, Take 1 capsule by mouth daily., Disp: , Rfl:  .  olmesartan (BENICAR) 40 MG tablet, Take 1 tablet (40 mg total) by mouth daily., Disp: 90 tablet, Rfl: 1 .  oxybutynin (DITROPAN) 5 MG tablet, Take 2 tablets (10 mg total) by mouth 2 (two) times daily., Disp: 360 tablet, Rfl: 1 .  rOPINIRole (REQUIP) 2 MG tablet, Take 1 tablet (2 mg total) by mouth at bedtime., Disp: 90 tablet, Rfl: 1 .  valACYclovir (VALTREX) 500 MG tablet, TAKE 1 TABLET BY MOUTH 2  TIMES DAILY. PER EPISODE AS NEEDED, TAKE FOR 10 DAYS (Patient taking differently: TAKE 1 TABLET BY MOUTH 2  TIMES DAILY AS NEEDED. PER EPISODE AS NEEDED, TAKE FOR 10 DAYS), Disp: 90 tablet, Rfl: 0  Allergies  Allergen Reactions  . Amlodipine Swelling    ROS  Constitutional: Negative for fever or weight change.  Respiratory: Negative for cough and shortness of breath.   Cardiovascular: Negative for chest pain or palpitations.  Gastrointestinal: Negative for abdominal pain, no bowel changes.  Musculoskeletal: Negative for gait  problem or joint swelling. + right hand pain  Skin: Negative for rash.  Neurological: Negative for dizziness or headache.  No other specific complaints in a complete review of systems (except as listed in HPI above).   Objective  Vitals:   08/21/17 1428  BP: 134/68  Pulse: 81  Resp: 16  Temp: 98.2 F (36.8 C)  TempSrc: Oral  SpO2: 94%  Weight: 162 lb 9.6 oz (73.8 kg)  Height: 5\' 5"  (1.651 m)    Body mass index is 27.06 kg/m.  Physical Exam  Constitutional: Patient appears well-developed and well-nourished. Overweight. No distress.  HEENT: head atraumatic, normocephalic, pupils equal and reactive to light,  neck supple, throat within normal limits Cardiovascular: Normal rate, regular rhythm and normal heart sounds.  Positive 3/6  murmur heard on 2nd right intercostal space. No BLE edema. Pulmonary/Chest: Effort normal and breath sounds normal. No respiratory distress. Abdominal: Soft.  There is no tenderness. Psychiatric: Patient has a normal mood and affect. behavior is normal. Judgment and thought content normal.  PHQ2/9: Depression screen [facility] 2/9 08/21/2017 02/25/2017 08/20/2016 02/20/2016 06/22/2015  Decreased Interest 0 0 0 0 0  Down, Depressed, Hopeless 0 0 0 0 0  PHQ - 2 Score 0 0 0 0 0     Fall Risk: Fall Risk  08/21/2017 02/25/2017 08/20/2016 02/20/2016 06/22/2015  Falls in the past year? No No No Yes No  Number falls in past yr: - - - 1 -  Injury with Fall? - - - No -      Functional Status Survey: Is the patient deaf or have difficulty hearing?: No Does the patient have difficulty seeing, even when wearing glasses/contacts?: No Does the patient have difficulty concentrating, remembering, or making decisions?: No Does the patient have difficulty walking or climbing stairs?: No Does the patient have difficulty dressing or bathing?: No Does the patient have difficulty doing errands alone such as visiting a doctor's office or shopping?: No   Assessment &  Plan  1. Metabolic syndrome  Normal Lawson,  she has been on Trulicity for pre-diabetes and weight loss, however it is too expensive and hgbA1C is down to 5.9%, therefore she will try diet only from now on - POCT UA - Microalbumin - POCT HgB A1C  2. Benign essential HTN  bp is at goal   3. RLS (restless legs syndrome)  Doing well  4. Wheezing  Resolved   5. Dyslipidemia   6. Arthralgia of both hands  Stable  7. Chronic bilateral low back pain without sciatica  She is taking Meloxicam, advised to decrease dose to 7.5 mg since she has noticed some GERD  8. Urge incontinence  - oxybutynin (DITROPAN) 5 MG tablet; Take 2 tablets (10 mg total) by mouth 2 (two) times daily.  Dispense: 360 tablet; Refill: 1  9. Unstable angina [facility])  Doing well on medication management

## 2017-08-23 NOTE — Progress Notes (Signed)
Cardiology Office Note  Date:  08/25/2017   ID:  Alyssa Lawson, DOB 05/31/1946, MRN [id number]  PCP:  Alyssa Sizer, MD   Chief Complaint  Patient presents with  . Other    3 month follow up. Patient deneis chest pain and SOB at this time. Meds reviewed verbally with patient.     HPI:  Alyssa Lawson is a pleasant 72 year old woman with past medical history of HTN Hyperlipidemia: 225 chol down to 163 on lipitor  Metabolic Syndrome last [id number]  5.6% , down from 6.3,  Smoked, quit 1998, started age 58, 19 years, <1 ppd Cardiac catheterization November 2018, no significant disease Who presents for f/u of her abnormal EKG , chest pain, mitral valve regurgitation  Blood pressure good at home Elevated on today's visit, feels that she is anxious Tolerating her medications Ran-through the list with her today, denies having significant side effects No regular exercise program Denies having any significant chest pain  She is periodically having tachycardia, not often  Urinary incontinence: RLS Chronic Back Pain  Wheezing  Rotator cuff tendinitis left shoulder  EKG personally reviewed by myself on todays visit Shows normal sinus rhythm rate 83 bpm T wave abnormality No change from previous EKGs  Other past medical history reviewed  Stress test 04/2017 Pharmacological myocardial perfusion imaging study with large region of ischemia in the mid to apical anteroseptal wall and entire lateral wall Normal wall motion, EF estimated at 63% No EKG changes concerning for ischemia at peak stress or in recovery. Poor exercise tolerance, 3: 30 min GI uptake artifact noted High risk scan  Cath 05/01/2017 False positive stress test  MR noted Atrial tachycardia after the case, given metoprolol IV  Echo 05/01/2017 Mild to moderate MR EF >55 Small to moderate pericardial effusion   PMH:   has a past medical history of Bladder disorder, Cancer ([place]), Coronary artery disease, non-occlusive,  Endometriosis, GERD (gastroesophageal reflux disease), Hyperlipidemia, Hypertension, Mitral regurgitation, and Restless leg syndrome.  PSH:    Past Surgical History:  Procedure Laterality Date  . ABDOMINAL HYSTERECTOMY  1976  . LEFT HEART CATH AND CORONARY ANGIOGRAPHY N/A 05/01/2017   Procedure: LEFT HEART CATH AND CORONARY ANGIOGRAPHY;  Surgeon: Minna Merritts, MD;  Location: [place] [facility];  Service: Cardiovascular;  Laterality: N/A;  . OOPHORECTOMY      Current Outpatient Medications  Medication Sig Dispense Refill  . acetaminophen (TYLENOL) 500 MG tablet Take 1 tablet (500 mg total) by mouth every 6 (six) hours as needed for headache. 30 tablet 0  . aspirin 81 MG chewable tablet Chew 1 tablet by mouth daily.    Marland Kitchen atorvastatin (LIPITOR) 40 MG tablet Take 1 tablet (40 mg total) by mouth daily. 90 tablet 1  . carvedilol (COREG) 25 MG tablet Take 1 tablet (25 mg total) by mouth 2 (two) times daily. 180 tablet 1  . ferrous sulfate 325 (65 FE) MG tablet Take 325 mg by mouth daily with breakfast.    . fluticasone (FLONASE) 50 MCG/ACT nasal spray USE 1 SPRAY IN EACH NOSTRIL TWO TIMES DAILY 48 g 1  . hydrALAZINE (APRESOLINE) 50 MG tablet Take 1 tablet (50 mg total) by mouth 3 (three) times daily. 270 tablet 3  . isosorbide mononitrate (IMDUR) 30 MG 24 hr tablet Take 1 tablet (30 mg total) by mouth daily. 90 tablet 3  . Magnesium Oxide 400 (240 MG) MG TABS Take 1 tablet by mouth daily.    . meloxicam (MOBIC) 15 MG tablet  Take 1 tablet (15 mg total) by mouth daily. 90 tablet 0  . methocarbamol (ROBAXIN) 500 MG tablet Take 1 tablet by mouth 2 (two) times daily.  1  . Multiple Vitamin (MULTIVITAMIN) capsule Take 1 capsule by mouth daily.    Marland Kitchen olmesartan (BENICAR) 40 MG tablet Take 1 tablet (40 mg total) by mouth daily. 90 tablet 1  . oxybutynin (DITROPAN) 5 MG tablet Take 2 tablets (10 mg total) by mouth 2 (two) times daily. 360 tablet 1  . rOPINIRole (REQUIP) 2 MG tablet Take 1 tablet (2  mg total) by mouth at bedtime. 90 tablet 1  . valACYclovir (VALTREX) 500 MG tablet TAKE 1 TABLET BY MOUTH 2  TIMES DAILY. PER EPISODE AS NEEDED, TAKE FOR 10 DAYS (Patient taking differently: TAKE 1 TABLET BY MOUTH 2  TIMES DAILY AS NEEDED. PER EPISODE AS NEEDED, TAKE FOR 10 DAYS) 90 tablet 0   No current facility-administered medications for this visit.      Allergies:   Amlodipine   Social History:  The patient  reports that she quit smoking about 21 years ago. Her smoking use included cigarettes. She started smoking about 61 years ago. She has a 9.00 pack-year smoking history. She quit smokeless tobacco use about 20 years ago. She reports that she drinks alcohol. She reports that she does not use drugs.   Family History:   family history includes Arrhythmia in her father and sister; Breast cancer (age of onset: 21) in her paternal aunt; Cardiomyopathy in her sister; Congestive Heart Failure in her brother; Diabetes in her brother; Heart failure in her father; Hypertension in her brother, father, and sister.    Review of Systems: Review of Systems  Constitutional: Negative.   Respiratory: Negative.   Cardiovascular: Negative.   Gastrointestinal: Negative.   Musculoskeletal: Negative.   Neurological: Negative.   Psychiatric/Behavioral: The patient is nervous/anxious.   All other systems reviewed and are negative.    PHYSICAL EXAM: VS:  BP (!) 180/90 (BP Location: Left Arm, Patient Position: Sitting, Cuff Size: Normal)   Pulse 83   Ht 5\' 5"  (1.651 m)   Wt 164 lb (74.4 kg)   BMI 27.29 kg/m  , BMI Body mass index is 27.29 kg/m. Constitutional:  oriented to person, place, and time. No distress.  mildly anxious HENT:  Head: Normocephalic and atraumatic.  Eyes:  no discharge. No scleral icterus.  Neck: Normal range of motion. Neck supple. No JVD present.  Cardiovascular: Normal rate, regular rhythm, normal heart sounds and intact distal pulses. Exam reveals no gallop and no friction  rub. No edema No murmur heard. Pulmonary/Chest: Effort normal and breath sounds normal. No stridor. No respiratory distress.  no wheezes.  no rales.  no tenderness.  Abdominal: Soft.  no distension.  no tenderness.  Musculoskeletal: Normal range of motion.  no  tenderness or deformity.  Neurological:  normal muscle tone. Coordination normal. No atrophy Skin: Skin is warm and dry. No rash noted. not diaphoretic.  Psychiatric:  normal mood and affect. behavior is normal. Thought content normal.   Recent Labs: 02/25/2017: ALT 22; TSH 1.15 04/30/2017: BUN 24; Creatinine, Ser 0.94; Hemoglobin 13.2; Platelets 267; Potassium 4.1; Sodium 140    Lipid Panel Lab Results  Component Value Date   CHOL 164 08/29/2016   HDL 81 08/29/2016   LDLCALC 69 08/29/2016   TRIG 69 08/29/2016      Wt Readings from Last 3 Encounters:  08/25/17 164 lb (74.4 kg)  08/21/17 162 lb 9.6  oz (73.8 kg)  05/20/17 165 lb 4 oz (75 kg)       ASSESSMENT AND PLAN:  Chest pain, unspecified type -  Denies having any further chest pain  Recent cardiac catheterization no significant stenosis  No further ischemic workup needed   Atrial tachycardia Noted after cardiac catheterization, required IV metoprolol to make the rhythm break She does notice periodic tachycardia, rare Recommended for now if she has breakthrough tachycardia she take extra half dose of carvedilol. For frequent episodes recommended she call our office  Benign essential HTN Markedly elevated on today's visit, likely secondary to anxiety/ whitecoat  has to blood pressure cuffs at home that are similar, reports blood pressure well controlled at home  Was well-controlled 3-4 days ago when seen by primary care  No medication changes made  Recommended she call us with numbers later in the day   Dyslipidemia Currently close to goal LDL less than 70 Stable   Smoking history Stopped smoking many years ago, smoked for 35 years approximately  Mitral  valve regurgitation Murmur appreciated on exam, moderate on cardiac catheterization, mild to moderate on echocardiogram. Will monitor for now  Type 2 diabetes mellitus with complication, with long-term current use of insulin ([place]) Recommended regular walking program, low carbohydrates  Disposition:   F/U 12 mo   Total encounter time more than 25 minutes  Greater than 50% was spent in counseling and coordination of care with the patient    Orders Placed This Encounter  Procedures  . EKG 12-Lead     Signed, Esmond Plants, M.D., Ph.D. 08/25/2017  [place], [place]

## 2017-08-25 ENCOUNTER — Ambulatory Visit (INDEPENDENT_AMBULATORY_CARE_PROVIDER_SITE_OTHER): Payer: Medicare HMO | Admitting: Cardiovascular Disease

## 2017-08-25 ENCOUNTER — Encounter: Payer: Self-pay | Admitting: Cardiovascular Disease

## 2017-08-25 VITALS — BP 180/90 | HR 83 | Ht 65.0 in | Wt 164.0 lb

## 2017-08-25 DIAGNOSIS — I471 Supraventricular tachycardia: Secondary | ICD-10-CM | POA: Diagnosis not present

## 2017-08-25 DIAGNOSIS — I340 Nonrheumatic mitral (valve) insufficiency: Secondary | ICD-10-CM

## 2017-08-25 DIAGNOSIS — R079 Chest pain, unspecified: Secondary | ICD-10-CM | POA: Diagnosis not present

## 2017-08-25 DIAGNOSIS — E118 Type 2 diabetes mellitus with unspecified complications: Secondary | ICD-10-CM

## 2017-08-25 DIAGNOSIS — Z794 Long term (current) use of insulin: Secondary | ICD-10-CM | POA: Diagnosis not present

## 2017-08-25 DIAGNOSIS — E782 Mixed hyperlipidemia: Secondary | ICD-10-CM

## 2017-08-25 DIAGNOSIS — I10 Essential (primary) hypertension: Secondary | ICD-10-CM | POA: Diagnosis not present

## 2017-08-25 DIAGNOSIS — F17200 Nicotine dependence, unspecified, uncomplicated: Secondary | ICD-10-CM

## 2017-08-25 NOTE — Patient Instructions (Signed)

## 2017-09-15 ENCOUNTER — Other Ambulatory Visit: Payer: Self-pay | Admitting: Family Medicine

## 2017-09-21 ENCOUNTER — Encounter: Payer: Self-pay | Admitting: Family Medicine

## 2017-09-21 ENCOUNTER — Ambulatory Visit (INDEPENDENT_AMBULATORY_CARE_PROVIDER_SITE_OTHER): Payer: PRIVATE HEALTH INSURANCE | Admitting: Family Medicine

## 2017-09-21 VITALS — BP 160/92 | HR 72 | Resp 14 | Ht 65.0 in | Wt 164.0 lb

## 2017-09-21 DIAGNOSIS — I10 Essential (primary) hypertension: Secondary | ICD-10-CM

## 2017-09-21 DIAGNOSIS — N3941 Urge incontinence: Secondary | ICD-10-CM | POA: Diagnosis not present

## 2017-09-21 MED ORDER — CLONIDINE HCL 0.1 MG PO TABS: 0.1000 mg | ORAL_TABLET | Freq: Every evening | ORAL | 1 refills | Status: DC

## 2017-09-21 MED ORDER — CLONIDINE HCL 0.1 MG PO TABS: 0.1000 mg | ORAL_TABLET | Freq: Three times a day (TID) | ORAL | 3 refills | Status: DC

## 2017-09-21 NOTE — Progress Notes (Signed)
Name: Alyssa Lawson   MRN: [id number]    DOB: 1945/11/18   Date:09/21/2017       Progress Note  Subjective  Chief Complaint  Chief Complaint  Patient presents with  . Hypertension  . Urinary Incontinence    HPI  HTN: she came in today and bp very high, she states at home is usually high in the mornings, but better during the day, no chest pain , very seldom has palpitation. No headaches, nausea or vomiting. She agrees on adding clonidine at night, we will avoid HCTZ because of her history of urinary frequency ( has improved with medication)  Advised to monitor bp at home and send me reading through [place]. Return in one week for bp check only.    Patient Active Problem List   Diagnosis Date Noted  . Atrial tachycardia ([place]) 08/25/2017  . Unstable angina ([place]) 05/01/2017  . Positive cardiac stress test 05/01/2017  . Chest pain 04/24/2017  . Abnormal EKG 04/24/2017  . Smoking history 04/24/2017  . Rotator cuff tendinitis, left 04/10/2017  . Cataracts, bilateral 11/25/2016  . Impingement syndrome of shoulder, left 10/13/2016  . Chronic bilateral low back pain without sciatica 08/20/2016  . Metabolic syndrome 93/23/5573  . Mitral regurgitation 06/22/2015  . Melasma 12/21/2014  . History of hysterectomy 12/21/2014  . Restless leg syndrome 12/21/2014  . Benign essential HTN 12/17/2014  . Cataract 12/17/2014  . Dyslipidemia 12/17/2014  . Gastric reflux 12/17/2014  . History of cervical cancer 12/17/2014  . H/O iron deficiency anemia 12/17/2014  . Urinary incontinence in female 12/17/2014  . Dysmetabolic syndrome 22/07/5425  . TI (tricuspid incompetence) 12/17/2014  . Osteopenia of the elderly 12/17/2014    Past Surgical History:  Procedure Laterality Date  . ABDOMINAL HYSTERECTOMY  1976  . LEFT HEART CATH AND CORONARY ANGIOGRAPHY N/A 05/01/2017   Procedure: LEFT HEART CATH AND CORONARY ANGIOGRAPHY;  Surgeon: Minna Merritts, MD;  Location: [facility];  Service:  Cardiovascular;  Laterality: N/A;  . OOPHORECTOMY      Family History  Problem Relation Age of Onset  . Heart failure Father   . Arrhythmia Father   . Hypertension Father   . Arrhythmia Sister   . Hypertension Sister   . Congestive Heart Failure Brother   . Hypertension Brother   . Cardiomyopathy Sister   . Diabetes Brother   . Breast cancer Paternal Aunt 39    Social History   Socioeconomic History  . Marital status: Married    Spouse name: Not on file  . Number of children: Not on file  . Years of education: Not on file  . Highest education level: Not on file  Occupational History  . Not on file  Social Needs  . Financial resource strain: Not on file  . Food insecurity:    Worry: Not on file    Inability: Not on file  . Transportation needs:    Medical: Not on file    Non-medical: Not on file  Tobacco Use  . Smoking status: Former Smoker    Packs/day: 0.50    Years: 18.00    Pack years: 9.00    Types: Cigarettes    Start date: 06/23/1956    Last attempt to quit: 11/22/1995    Years since quitting: 21.8  . Smokeless tobacco: Former Systems developer    Quit date: 11/22/1996  Substance and Sexual Activity  . Alcohol use: Yes    Alcohol/week: 0.0 oz    Comment: socially  .  Drug use: No  . Sexual activity: Yes    Partners: Male  Lifestyle  . Physical activity:    Days per week: Not on file    Minutes per session: Not on file  . Stress: Not on file  Relationships  . Social connections:    Talks on phone: Not on file    Gets together: Not on file    Attends religious service: Not on file    Active member of club or organization: Not on file    Attends meetings of clubs or organizations: Not on file    Relationship status: Not on file  . Intimate partner violence:    Fear of current or ex partner: Not on file    Emotionally abused: Not on file    Physically abused: Not on file    Forced sexual activity: Not on file  Other Topics Concern  . 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  acetaminophen (TYLENOL) 500 MG tablet, Take 1 tablet (500 mg total) by mouth every 6 (six) hours as needed for headache., Disp: 30 tablet, Rfl: 0 .  aspirin 81 MG chewable tablet, Chew 1 tablet by mouth daily., Disp: , Rfl:  .  atorvastatin (LIPITOR) 40 MG tablet, Take 1 tablet (40 mg total) by mouth daily., Disp: 90 tablet, Rfl: 1 .  carvedilol (COREG) 25 MG tablet, Take 1 tablet (25 mg total) by mouth 2 (two) times daily., Disp: 180 tablet, Rfl: 1 .  ferrous sulfate 325 (65 FE) MG tablet, Take 325 mg by mouth daily with breakfast., Disp: , Rfl:  .  fluticasone (FLONASE) 50 MCG/ACT nasal spray, USE 1 SPRAY IN EACH NOSTRIL TWO TIMES DAILY, Disp: 48 g, Rfl: 1 .  hydrALAZINE (APRESOLINE) 50 MG tablet, Take 1 tablet (50 mg total) by mouth 3 (three) times daily., Disp: 270 tablet, Rfl: 3 .  isosorbide mononitrate (IMDUR) 30 MG 24 hr tablet, Take 1 tablet (30 mg total) by mouth daily., Disp: 90 tablet, Rfl: 3 .  Magnesium Oxide 400 (240 MG) MG TABS, Take 1 tablet by mouth daily., Disp: , Rfl:  .  meloxicam (MOBIC) 15 MG tablet, Take 1 tablet (15 mg total) by mouth daily., Disp: 90 tablet, Rfl: 0 .  methocarbamol (ROBAXIN) 500 MG tablet, Take 1 tablet by mouth 2 (two) times daily., Disp: , Rfl: 1 .  Multiple Vitamin (MULTIVITAMIN) capsule, Take 1 capsule by mouth daily., Disp: , Rfl:  .  olmesartan (BENICAR) 40 MG tablet, Take 1 tablet (40 mg total) by mouth daily., Disp: 90 tablet, Rfl: 1 .  oxybutynin (DITROPAN) 5 MG tablet, Take 2 tablets (10 mg total) by mouth 2 (two) times daily., Disp: 360 tablet, Rfl: 1 .  rOPINIRole (REQUIP) 2 MG tablet, Take 1 tablet (2 mg total) by mouth at bedtime., Disp: 90 tablet, Rfl: 1 .  valACYclovir (VALTREX) 500 MG tablet, TAKE 1 TABLET BY MOUTH 2  TIMES  DAILY. PER EPISODE AS NEEDED, TAKE FOR 10 DAYS (Patient taking differently: TAKE 1 TABLET BY MOUTH 2  TIMES DAILY AS NEEDED. PER EPISODE AS NEEDED, TAKE FOR 10 DAYS), Disp: 90 tablet, Rfl: 0 .  cloNIDine (CATAPRES) 0.1 MG tablet, Take 1 tablet (0.1 mg total) by mouth 3 (three) times daily., Disp: 90 tablet, Rfl:  3  Allergies  Allergen Reactions  . Amlodipine Swelling     ROS  Constitutional: Negative for fever or weight change.  Respiratory: Negative for cough and shortness of breath.   Cardiovascular: Negative for chest pain, positive for palpitations seldom.  Gastrointestinal: Negative for abdominal pain, no bowel changes.  Musculoskeletal: Negative for gait problem or joint swelling.  Skin: Negative for rash.  Neurological: Negative for dizziness or headache.  No other specific complaints in a complete review of systems (except as listed in HPI above).  Objective  Vitals:   09/21/17 1313 09/21/17 1337  BP: (!) 180/100 (!) 160/92  Pulse: 72   Resp: 14   SpO2: 95%   Weight: 164 lb (74.4 kg)   Height: 5\' 5"  (1.651 m)     Body mass index is 27.29 kg/m.  Physical Exam  Constitutional: Patient appears well-developed and well-nourished. Overweight.  No distress.  HEENT: head atraumatic, normocephalic, pupils equal and reactive to light,  neck supple, throat within normal limits Cardiovascular: Normal rate, regular rhythm and normal heart sounds.  No murmur heard. No BLE edema. Pulmonary/Chest: Effort normal and breath sounds normal. No respiratory distress. Abdominal: Soft.  There is no tenderness. Psychiatric: Patient has a normal mood and affect. behavior is normal. Judgment and thought content normal.  Recent Results (from the past 2160 hour(s))  POCT HgB A1C     Status: Normal   Collection Time: 08/21/17  3:17 PM  Result Value Ref Range   Hemoglobin A1C 5.9   POCT UA - Microalbumin     Status: Abnormal   Collection Time: 08/21/17  3:18 PM  Result Value Ref Range    Microalbumin Ur, POC 20 mg/L   Creatinine, POC  mg/dL   Albumin/Creatinine Ratio, Urine, POC      Diabetic Foot Exam: Diabetic Foot Exam - Simple   Simple Foot Form Diabetic Foot exam was performed with the following findings:  Yes 09/21/2017  1:50 PM  Visual Inspection No deformities, no ulcerations, no other skin breakdown bilaterally:  Yes Sensation Testing Intact to touch and monofilament testing bilaterally:  Yes Pulse Check Posterior Tibialis and Dorsalis pulse intact bilaterally:  Yes Comments      PHQ2/9: Depression screen [facility] 2/9 08/21/2017 02/25/2017 08/20/2016 02/20/2016 06/22/2015  Decreased Interest 0 0 0 0 0  Down, Depressed, Hopeless 0 0 0 0 0  PHQ - 2 Score 0 0 0 0 0    Fall Risk: Fall Risk  09/21/2017 08/21/2017 02/25/2017 08/20/2016 02/20/2016  Falls in the past year? No No No No Yes  Number falls in past yr: - - - - 1  Injury with Fall? - - - - No    Functional Status Survey: Is the patient deaf or have difficulty hearing?: No Does the patient have difficulty seeing, even when wearing glasses/contacts?: No Does the patient have difficulty concentrating, remembering, or making decisions?: No Does the patient have difficulty walking or climbing stairs?: No Does the patient have difficulty dressing or bathing?: No Does the patient have difficulty doing errands alone such as visiting a doctor's office or shopping?: No    Assessment & Plan  1. Uncontrolled hypertension  - cloNIDine (CATAPRES) 0.1 MG tablet; Take 1 tablet (0.1 mg total) by mouth every evening.  Dispense: 90 tablet; Refill: 1  Advised to cut down on Meloxicam.  Discussed possible side effects of catapres and needs to take it at night  Go to [facility] if neuro deficit  - CBC -Comp panel - lipid  panel  TSH and parathyroid checked in the past we will hold off for now  2. Urge incontinence  She is doing better on medication

## 2017-09-22 LAB — CBC WITH DIFFERENTIAL/PLATELET
BASOS ABS: 62 {cells}/uL (ref 0–200)
Basophils Relative: 1 %
EOS ABS: 279 {cells}/uL (ref 15–500)
Eosinophils Relative: 4.5 %
HCT: 40.5 % (ref 35.0–45.0)
HEMOGLOBIN: 14.2 g/dL (ref 11.7–15.5)
Lymphs Abs: 1668 cells/uL (ref 850–3900)
MCH: 30.5 pg (ref 27.0–33.0)
MCHC: 35.1 g/dL (ref 32.0–36.0)
MCV: 87.1 fL (ref 80.0–100.0)
MONOS PCT: 7.8 %
MPV: 11.5 fL (ref 7.5–12.5)
NEUTROS ABS: 3708 {cells}/uL (ref 1500–7800)
NEUTROS PCT: 59.8 %
Platelets: 233 10*3/uL (ref 140–400)
RBC: 4.65 10*6/uL (ref 3.80–5.10)
RDW: 12.5 % (ref 11.0–15.0)
TOTAL LYMPHOCYTE: 26.9 %
WBC mixed population: 484 cells/uL (ref 200–950)
WBC: 6.2 10*3/uL (ref 3.8–10.8)

## 2017-09-22 LAB — LIPID PANEL
Cholesterol: 163 mg/dL (ref <200)
HDL: 69 mg/dL (ref >50)
LDL CHOLESTEROL (CALC): 76 mg/dL
Non-HDL Cholesterol (Calc): 94 mg/dL (calc) (ref <130)
Total CHOL/HDL Ratio: 2.4 (calc) (ref <5.0)
Triglycerides: 101 mg/dL (ref <150)

## 2017-09-22 LAB — COMPLETE METABOLIC PANEL WITH GFR
AG RATIO: 1.7 (calc) (ref 1.0–2.5)
ALBUMIN MSPROF: 4.6 g/dL (ref 3.6–5.1)
ALT: 29 U/L (ref 6–29)
AST: 25 U/L (ref 10–35)
Alkaline phosphatase (APISO): 64 U/L (ref 33–130)
BILIRUBIN TOTAL: 0.6 mg/dL (ref 0.2–1.2)
BUN / CREAT RATIO: 18 (calc) (ref 6–22)
BUN: 19 mg/dL (ref 7–25)
CHLORIDE: 103 mmol/L (ref 98–110)
CO2: 31 mmol/L (ref 20–32)
Calcium: 10 mg/dL (ref 8.6–10.4)
Creat: 1.05 mg/dL — ABNORMAL HIGH (ref 0.60–0.93)
GFR, EST AFRICAN AMERICAN: 62 mL/min/{1.73_m2} (ref >=60)
GFR, Est Non African American: 53 mL/min/{1.73_m2} — ABNORMAL LOW (ref >=60)
GLOBULIN: 2.7 g/dL (ref 1.9–3.7)
GLUCOSE: 88 mg/dL (ref 65–99)
Potassium: 4.5 mmol/L (ref 3.5–5.3)
SODIUM: 140 mmol/L (ref 135–146)
TOTAL PROTEIN: 7.3 g/dL (ref 6.1–8.1)

## 2017-09-23 ENCOUNTER — Encounter: Payer: Self-pay | Admitting: Cardiovascular Disease

## 2017-09-23 ENCOUNTER — Encounter: Payer: Self-pay | Admitting: Family Medicine

## 2017-09-24 ENCOUNTER — Encounter: Payer: Self-pay | Admitting: Cardiovascular Disease

## 2017-09-24 ENCOUNTER — Encounter: Payer: Self-pay | Admitting: Family Medicine

## 2017-09-28 ENCOUNTER — Encounter: Payer: Self-pay | Admitting: Family Medicine

## 2017-09-28 ENCOUNTER — Ambulatory Visit (INDEPENDENT_AMBULATORY_CARE_PROVIDER_SITE_OTHER): Payer: PRIVATE HEALTH INSURANCE | Admitting: Family Medicine

## 2017-09-28 ENCOUNTER — Other Ambulatory Visit: Payer: Self-pay | Admitting: Family Medicine

## 2017-09-28 VITALS — BP 168/88 | HR 84 | Resp 18 | Ht 65.0 in | Wt 165.3 lb

## 2017-09-28 DIAGNOSIS — I10 Essential (primary) hypertension: Secondary | ICD-10-CM

## 2017-09-28 MED ORDER — NEBIVOLOL HCL 20 MG PO TABS: 1.0000 | ORAL_TABLET | Freq: Every day | ORAL | 0 refills | Status: DC

## 2017-09-28 NOTE — Progress Notes (Signed)
BP Check-Patient is experiencing dizziness, heart palpations, belching and extreme heartburn since Clonidine was called in to take 3x a day. Patient is unable to tolerate this medication and is making her sleepy. Patient states her BP at home is running around 138/88 and 121/111.

## 2017-09-28 NOTE — Progress Notes (Signed)
Name: Alyssa Lawson   MRN: [id number]    DOB: 03/02/1946   Date:09/28/2017       Progress Note  Subjective  Chief Complaint  Chief Complaint  Patient presents with  . Hypertension    HPI  Patient came in for bp check, she brought her home log that showed 138/88, one time 121/111. Last visit bp was also elevated. She has noticed some heart palpitationbut elevated in our office multiple times, discussed with Tiffany, we stop Carvedilol and started Bystolic daily, advised to return in one week for recheck . She states dizziness and indigestion only when she took clonidine tid, now that she is taking qhs no longer causing problems but bp not controlled.    Patient Active Problem List   Diagnosis Date Noted  . Atrial tachycardia ([place]) 08/25/2017  . Unstable angina ([place]) 05/01/2017  . Positive cardiac stress test 05/01/2017  . Chest pain 04/24/2017  . Abnormal EKG 04/24/2017  . Smoking history 04/24/2017  . Rotator cuff tendinitis, left 04/10/2017  . Cataracts, bilateral 11/25/2016  . Impingement syndrome of shoulder, left 10/13/2016  . Chronic bilateral low back pain without sciatica 08/20/2016  . Metabolic syndrome 36/62/9476  . Mitral regurgitation 06/22/2015  . Melasma 12/21/2014  . History of hysterectomy 12/21/2014  . Restless leg syndrome 12/21/2014  . Benign essential HTN 12/17/2014  . Cataract 12/17/2014  . Dyslipidemia 12/17/2014  . Gastric reflux 12/17/2014  . History of cervical cancer 12/17/2014  . H/O iron deficiency anemia 12/17/2014  . Urinary incontinence in female 12/17/2014  . Dysmetabolic syndrome 54/65/0354  . TI (tricuspid incompetence) 12/17/2014  . Osteopenia of the elderly 12/17/2014    Social History   Tobacco Use  . Smoking status: Former Smoker    Packs/day: 0.50    Years: 18.00    Pack years: 9.00    Types: Cigarettes    Start date: 06/23/1956    Last attempt to quit: 11/22/1995    Years since quitting: 21.8  . Smokeless tobacco: Former Systems developer     Quit date: 11/22/1996  Substance Use Topics  . Alcohol use: Yes    Alcohol/week: 0.0 oz    Comment: socially     Current Outpatient Medications:  .  acetaminophen (TYLENOL) 500 MG tablet, Take 1 tablet (500 mg total) by mouth every 6 (six) hours as needed for headache., Disp: 30 tablet, Rfl: 0 .  aspirin 81 MG chewable tablet, Chew 1 tablet by mouth daily., Disp: , Rfl:  .  atorvastatin (LIPITOR) 40 MG tablet, Take 1 tablet (40 mg total) by mouth daily., Disp: 90 tablet, Rfl: 1 .  cloNIDine (CATAPRES) 0.1 MG tablet, Take 1 tablet (0.1 mg total) by mouth every evening., Disp: 90 tablet, Rfl: 1 .  ferrous sulfate 325 (65 FE) MG tablet, Take 325 mg by mouth daily with breakfast., Disp: , Rfl:  .  fluticasone (FLONASE) 50 MCG/ACT nasal spray, USE 1 SPRAY IN EACH NOSTRIL TWO TIMES DAILY, Disp: 48 g, Rfl: 1 .  hydrALAZINE (APRESOLINE) 50 MG tablet, Take 1 tablet (50 mg total) by mouth 3 (three) times daily., Disp: 270 tablet, Rfl: 3 .  isosorbide mononitrate (IMDUR) 30 MG 24 hr tablet, Take 1 tablet (30 mg total) by mouth daily., Disp: 90 tablet, Rfl: 3 .  Magnesium Oxide 400 (240 MG) MG TABS, Take 1 tablet by mouth daily., Disp: , Rfl:  .  meloxicam (MOBIC) 15 MG tablet, Take 1 tablet (15 mg total) by mouth daily., Disp: 90 tablet, Rfl: 0 .  methocarbamol (ROBAXIN) 500 MG tablet, Take 1 tablet by mouth 2 (two) times daily., Disp: , Rfl: 1 .  Multiple Vitamin (MULTIVITAMIN) capsule, Take 1 capsule by mouth daily., Disp: , Rfl:  .  Nebivolol HCl (BYSTOLIC) 20 MG TABS, Take 1 tablet (20 mg total) by mouth daily., Disp: 30 tablet, Rfl: 0 .  olmesartan (BENICAR) 40 MG tablet, Take 1 tablet (40 mg total) by mouth daily., Disp: 90 tablet, Rfl: 1 .  oxybutynin (DITROPAN) 5 MG tablet, Take 2 tablets (10 mg total) by mouth 2 (two) times daily., Disp: 360 tablet, Rfl: 1 .  rOPINIRole (REQUIP) 2 MG tablet, Take 1 tablet (2 mg total) by mouth at bedtime., Disp: 90 tablet, Rfl: 1 .  valACYclovir (VALTREX) 500  MG tablet, TAKE 1 TABLET BY MOUTH 2  TIMES DAILY. PER EPISODE AS NEEDED, TAKE FOR 10 DAYS (Patient taking differently: TAKE 1 TABLET BY MOUTH 2  TIMES DAILY AS NEEDED. PER EPISODE AS NEEDED, TAKE FOR 10 DAYS), Disp: 90 tablet, Rfl: 0  Allergies  Allergen Reactions  . Amlodipine Swelling    ROS  Not done  Objective  Vitals:   09/28/17 1304 09/28/17 1309 09/28/17 1321  BP: (!) 178/92 (!) 180/110 (!) 168/88  Pulse: 84    Resp: 18    SpO2: 97%    Weight: 165 lb 4.8 oz (75 kg)    Height: 5\' 5"  (1.651 m)      Body mass index is 27.51 kg/m.    Physical Exam  Not done  Recent Results (from the past 2160 hour(s))  POCT HgB A1C     Status: Normal   Collection Time: 08/21/17  3:17 PM  Result Value Ref Range   Hemoglobin A1C 5.9   POCT UA - Microalbumin     Status: Abnormal   Collection Time: 08/21/17  3:18 PM  Result Value Ref Range   Microalbumin Ur, POC 20 mg/L   Creatinine, POC  mg/dL   Albumin/Creatinine Ratio, Urine, POC    CBC with Differential/Platelet     Status: None   Collection Time: 09/21/17  2:12 PM  Result Value Ref Range   WBC 6.2 3.8 - 10.8 Thousand/uL   RBC 4.65 3.80 - 5.10 Million/uL   Hemoglobin 14.2 11.7 - 15.5 g/dL   HCT 40.5 35.0 - 45.0 %   MCV 87.1 80.0 - 100.0 fL   MCH 30.5 27.0 - 33.0 pg   MCHC 35.1 32.0 - 36.0 g/dL   RDW 12.5 11.0 - 15.0 %   Platelets 233 140 - 400 Thousand/uL   MPV 11.5 7.5 - 12.5 fL   Neutro Abs 3,708 1,500 - 7,800 cells/uL   Lymphs Abs 1,668 850 - 3,900 cells/uL   WBC mixed population 484 200 - 950 cells/uL   Eosinophils Absolute 279 15 - 500 cells/uL   Basophils Absolute 62 0 - 200 cells/uL   Neutrophils Relative % 59.8 %   Total Lymphocyte 26.9 %   Monocytes Relative 7.8 %   Eosinophils Relative 4.5 %   Basophils Relative 1.0 %  COMPLETE METABOLIC PANEL WITH GFR     Status: Abnormal   Collection Time: 09/21/17  2:12 PM  Result Value Ref Range   Glucose, Bld 88 65 - 99 mg/dL    Comment: .            Fasting  reference interval .    BUN 19 7 - 25 mg/dL   Creat 1.05 (H) 0.60 - 0.93 mg/dL    Comment:  For patients >55 years of age, the reference limit for Creatinine is approximately 13% higher for people identified as African-American. .    GFR, Est Non African American 53 (L) > OR = 60 mL/min/1.70m2   GFR, Est African American 62 > OR = 60 mL/min/1.87m2   BUN/Creatinine Ratio 18 6 - 22 (calc)   Sodium 140 135 - 146 mmol/L   Potassium 4.5 3.5 - 5.3 mmol/L   Chloride 103 98 - 110 mmol/L   CO2 31 20 - 32 mmol/L   Calcium 10.0 8.6 - 10.4 mg/dL   Total Protein 7.3 6.1 - 8.1 g/dL   Albumin 4.6 3.6 - 5.1 g/dL   Globulin 2.7 1.9 - 3.7 g/dL (calc)   AG Ratio 1.7 1.0 - 2.5 (calc)   Total Bilirubin 0.6 0.2 - 1.2 mg/dL   Alkaline phosphatase (APISO) 64 33 - 130 U/L   AST 25 10 - 35 U/L   ALT 29 6 - 29 U/L  Lipid panel     Status: None   Collection Time: 09/21/17  2:12 PM  Result Value Ref Range   Cholesterol 163 <200 mg/dL   HDL 69 >50 mg/dL   Triglycerides 101 <150 mg/dL   LDL Cholesterol (Calc) 76 mg/dL (calc)    Comment: Reference range: <100 . Desirable range <100 mg/dL for primary prevention;   <70 mg/dL for patients with CHD or diabetic patients  with > or = 2 CHD risk factors. Marland Kitchen LDL-C is now calculated using the Martin-Hopkins  calculation, which is a validated novel method providing  better accuracy than the Friedewald equation in the  estimation of LDL-C.  Cresenciano Genre et al. Annamaria Helling. 4034;742(59): 2061-2068  (http://education.[facility].com/faq/FAQ164)    Total CHOL/HDL Ratio 2.4 <5.0 (calc)   Non-HDL Cholesterol (Calc) 94 <130 mg/dL (calc)    Comment: For patients with diabetes plus 1 major ASCVD risk  factor, treating to a non-HDL-C goal of <100 mg/dL  (LDL-C of <70 mg/dL) is considered a therapeutic  option.      Assessment & Plan  1. Uncontrolled hypertension  Started Bystolic, of carvedilol and return in one week for recheck

## 2017-09-28 NOTE — Patient Instructions (Addendum)
Dr. Ancil Boozer instructed patient to stop Coreg/Carvedilol and replace it with Bystolic. Still take the Clonidine 1 pill at night and Recheck BP in office in 2 weeks.

## 2017-09-28 NOTE — Progress Notes (Unsigned)
Name: Alyssa Lawson   MRN: [id number]    DOB: [birth year]   Date:09/28/2017       Progress Note  Subjective  Chief Complaint  No chief complaint on file.   HPI  ***  Patient Active Problem List   Diagnosis Date Noted  . Atrial tachycardia ([place]) 08/25/2017  . Unstable angina ([place]) 05/01/2017  . Positive cardiac stress test 05/01/2017  . Chest pain 04/24/2017  . Abnormal EKG 04/24/2017  . Smoking history 04/24/2017  . Rotator cuff tendinitis, left 04/10/2017  . Cataracts, bilateral 11/25/2016  . Impingement syndrome of shoulder, left 10/13/2016  . Chronic bilateral low back pain without sciatica 08/20/2016  . Metabolic syndrome 07/62/2633  . Mitral regurgitation 06/22/2015  . Melasma 12/21/2014  . History of hysterectomy 12/21/2014  . Restless leg syndrome 12/21/2014  . Benign essential HTN 12/17/2014  . Cataract 12/17/2014  . Dyslipidemia 12/17/2014  . Gastric reflux 12/17/2014  . History of cervical cancer 12/17/2014  . H/O iron deficiency anemia 12/17/2014  . Urinary incontinence in female 12/17/2014  . Dysmetabolic syndrome 35/45/6256  . TI (tricuspid incompetence) 12/17/2014  . Osteopenia of the elderly 12/17/2014    Social History   Tobacco Use  . Smoking status: Former Smoker    Packs/day: 0.50    Years: 18.00    Pack years: 9.00    Types: Cigarettes    Start date: 06/23/1956    Last attempt to quit: 11/22/1995    Years since quitting: 21.8  . Smokeless tobacco: Former Systems developer    Quit date: 11/22/1996  Substance Use Topics  . Alcohol use: Yes    Alcohol/week: 0.0 oz    Comment: socially     Current Outpatient Medications:  .  acetaminophen (TYLENOL) 500 MG tablet, Take 1 tablet (500 mg total) by mouth every 6 (six) hours as needed for headache., Disp: 30 tablet, Rfl: 0 .  aspirin 81 MG chewable tablet, Chew 1 tablet by mouth daily., Disp: , Rfl:  .  atorvastatin (LIPITOR) 40 MG tablet, Take 1 tablet (40 mg total) by mouth daily., Disp: 90 tablet, Rfl: 1 .   cloNIDine (CATAPRES) 0.1 MG tablet, Take 1 tablet (0.1 mg total) by mouth every evening., Disp: 90 tablet, Rfl: 1 .  ferrous sulfate 325 (65 FE) MG tablet, Take 325 mg by mouth daily with breakfast., Disp: , Rfl:  .  fluticasone (FLONASE) 50 MCG/ACT nasal spray, USE 1 SPRAY IN EACH NOSTRIL TWO TIMES DAILY, Disp: 48 g, Rfl: 1 .  hydrALAZINE (APRESOLINE) 50 MG tablet, Take 1 tablet (50 mg total) by mouth 3 (three) times daily., Disp: 270 tablet, Rfl: 3 .  isosorbide mononitrate (IMDUR) 30 MG 24 hr tablet, Take 1 tablet (30 mg total) by mouth daily., Disp: 90 tablet, Rfl: 3 .  Magnesium Oxide 400 (240 MG) MG TABS, Take 1 tablet by mouth daily., Disp: , Rfl:  .  meloxicam (MOBIC) 15 MG tablet, Take 1 tablet (15 mg total) by mouth daily., Disp: 90 tablet, Rfl: 0 .  methocarbamol (ROBAXIN) 500 MG tablet, Take 1 tablet by mouth 2 (two) times daily., Disp: , Rfl: 1 .  Multiple Vitamin (MULTIVITAMIN) capsule, Take 1 capsule by mouth daily., Disp: , Rfl:  .  Nebivolol HCl (BYSTOLIC) 20 MG TABS, Take 1 tablet (20 mg total) by mouth daily., Disp: 30 tablet, Rfl: 0 .  olmesartan (BENICAR) 40 MG tablet, Take 1 tablet (40 mg total) by mouth daily., Disp: 90 tablet, Rfl: 1 .  oxybutynin (DITROPAN) 5 MG  tablet, Take 2 tablets (10 mg total) by mouth 2 (two) times daily., Disp: 360 tablet, Rfl: 1 .  rOPINIRole (REQUIP) 2 MG tablet, Take 1 tablet (2 mg total) by mouth at bedtime., Disp: 90 tablet, Rfl: 1 .  valACYclovir (VALTREX) 500 MG tablet, TAKE 1 TABLET BY MOUTH 2  TIMES DAILY. PER EPISODE AS NEEDED, TAKE FOR 10 DAYS (Patient taking differently: TAKE 1 TABLET BY MOUTH 2  TIMES DAILY AS NEEDED. PER EPISODE AS NEEDED, TAKE FOR 10 DAYS), Disp: 90 tablet, Rfl: 0  Allergies  Allergen Reactions  . Amlodipine Swelling    ROS  ***  Objective  There were no vitals filed for this visit.  There is no height or weight on file to calculate BMI.    Physical Exam  ***  Recent Results (from the past 2160  hour(s))  POCT HgB A1C     Status: Normal   Collection Time: 08/21/17  3:17 PM  Result Value Ref Range   Hemoglobin A1C 5.9   POCT UA - Microalbumin     Status: Abnormal   Collection Time: 08/21/17  3:18 PM  Result Value Ref Range   Microalbumin Ur, POC 20 mg/L   Creatinine, POC  mg/dL   Albumin/Creatinine Ratio, Urine, POC    CBC with Differential/Platelet     Status: None   Collection Time: 09/21/17  2:12 PM  Result Value Ref Range   WBC 6.2 3.8 - 10.8 Thousand/uL   RBC 4.65 3.80 - 5.10 Million/uL   Hemoglobin 14.2 11.7 - 15.5 g/dL   HCT 40.5 35.0 - 45.0 %   MCV 87.1 80.0 - 100.0 fL   MCH 30.5 27.0 - 33.0 pg   MCHC 35.1 32.0 - 36.0 g/dL   RDW 12.5 11.0 - 15.0 %   Platelets 233 140 - 400 Thousand/uL   MPV 11.5 7.5 - 12.5 fL   Neutro Abs 3,708 1,500 - 7,800 cells/uL   Lymphs Abs 1,668 850 - 3,900 cells/uL   WBC mixed population 484 200 - 950 cells/uL   Eosinophils Absolute 279 15 - 500 cells/uL   Basophils Absolute 62 0 - 200 cells/uL   Neutrophils Relative % 59.8 %   Total Lymphocyte 26.9 %   Monocytes Relative 7.8 %   Eosinophils Relative 4.5 %   Basophils Relative 1.0 %  COMPLETE METABOLIC PANEL WITH GFR     Status: Abnormal   Collection Time: 09/21/17  2:12 PM  Result Value Ref Range   Glucose, Bld 88 65 - 99 mg/dL    Comment: .            Fasting reference interval .    BUN 19 7 - 25 mg/dL   Creat 1.05 (H) 0.60 - 0.93 mg/dL    Comment: For patients >38 years of age, the reference limit for Creatinine is approximately 13% higher for people identified as African-American. .    GFR, Est Non African American 53 (L) > OR = 60 mL/min/1.4m2   GFR, Est African American 62 > OR = 60 mL/min/1.2m2   BUN/Creatinine Ratio 18 6 - 22 (calc)   Sodium 140 135 - 146 mmol/L   Potassium 4.5 3.5 - 5.3 mmol/L   Chloride 103 98 - 110 mmol/L   CO2 31 20 - 32 mmol/L   Calcium 10.0 8.6 - 10.4 mg/dL   Total Protein 7.3 6.1 - 8.1 g/dL   Albumin 4.6 3.6 - 5.1 g/dL   Globulin 2.7  1.9 - 3.7 g/dL (calc)  AG Ratio 1.7 1.0 - 2.5 (calc)   Total Bilirubin 0.6 0.2 - 1.2 mg/dL   Alkaline phosphatase (APISO) 64 33 - 130 U/L   AST 25 10 - 35 U/L   ALT 29 6 - 29 U/L  Lipid panel     Status: None   Collection Time: 09/21/17  2:12 PM  Result Value Ref Range   Cholesterol 163 <200 mg/dL   HDL 69 >50 mg/dL   Triglycerides 101 <150 mg/dL   LDL Cholesterol (Calc) 76 mg/dL (calc)    Comment: Reference range: <100 . Desirable range <100 mg/dL for primary prevention;   <70 mg/dL for patients with CHD or diabetic patients  with > or = 2 CHD risk factors. Marland Kitchen LDL-C is now calculated using the Martin-Hopkins  calculation, which is a validated novel method providing  better accuracy than the Friedewald equation in the  estimation of LDL-C.  Cresenciano Genre et al. Annamaria Helling. 0786;754(49): 2061-2068  (http://education.[facility].com/faq/FAQ164)    Total CHOL/HDL Ratio 2.4 <5.0 (calc)   Non-HDL Cholesterol (Calc) 94 <130 mg/dL (calc)    Comment: For patients with diabetes plus 1 major ASCVD risk  factor, treating to a non-HDL-C goal of <100 mg/dL  (LDL-C of <70 mg/dL) is considered a therapeutic  option.      Assessment & Plan  1. Uncontrolled hypertension *** - Nebivolol HCl (BYSTOLIC) 20 MG TABS; Take 1 tablet (20 mg total) by mouth daily.  Dispense: 30 tablet; Refill: 0

## 2017-09-29 ENCOUNTER — Encounter: Payer: Self-pay | Admitting: Family Medicine

## 2017-10-02 ENCOUNTER — Encounter: Payer: Self-pay | Admitting: Family Medicine

## 2017-10-12 ENCOUNTER — Ambulatory Visit: Payer: PRIVATE HEALTH INSURANCE

## 2017-10-14 ENCOUNTER — Encounter: Payer: Self-pay | Admitting: Family Medicine

## 2017-10-14 NOTE — Telephone Encounter (Signed)
Cost of Bystolic 20mg . Please see if we can get her samples for the next month until she can be re-evaluated.  I told her you would try calling her with a response tomorrow.  Thank you

## 2017-10-15 IMAGING — CR DG CHEST 2V
2 series · 2 of 2 positions shown · non-contrast
Comparison: 09/04/2014

CLINICAL DATA: Cough

EXAM:
CHEST - 2 VIEW

[chest pa]
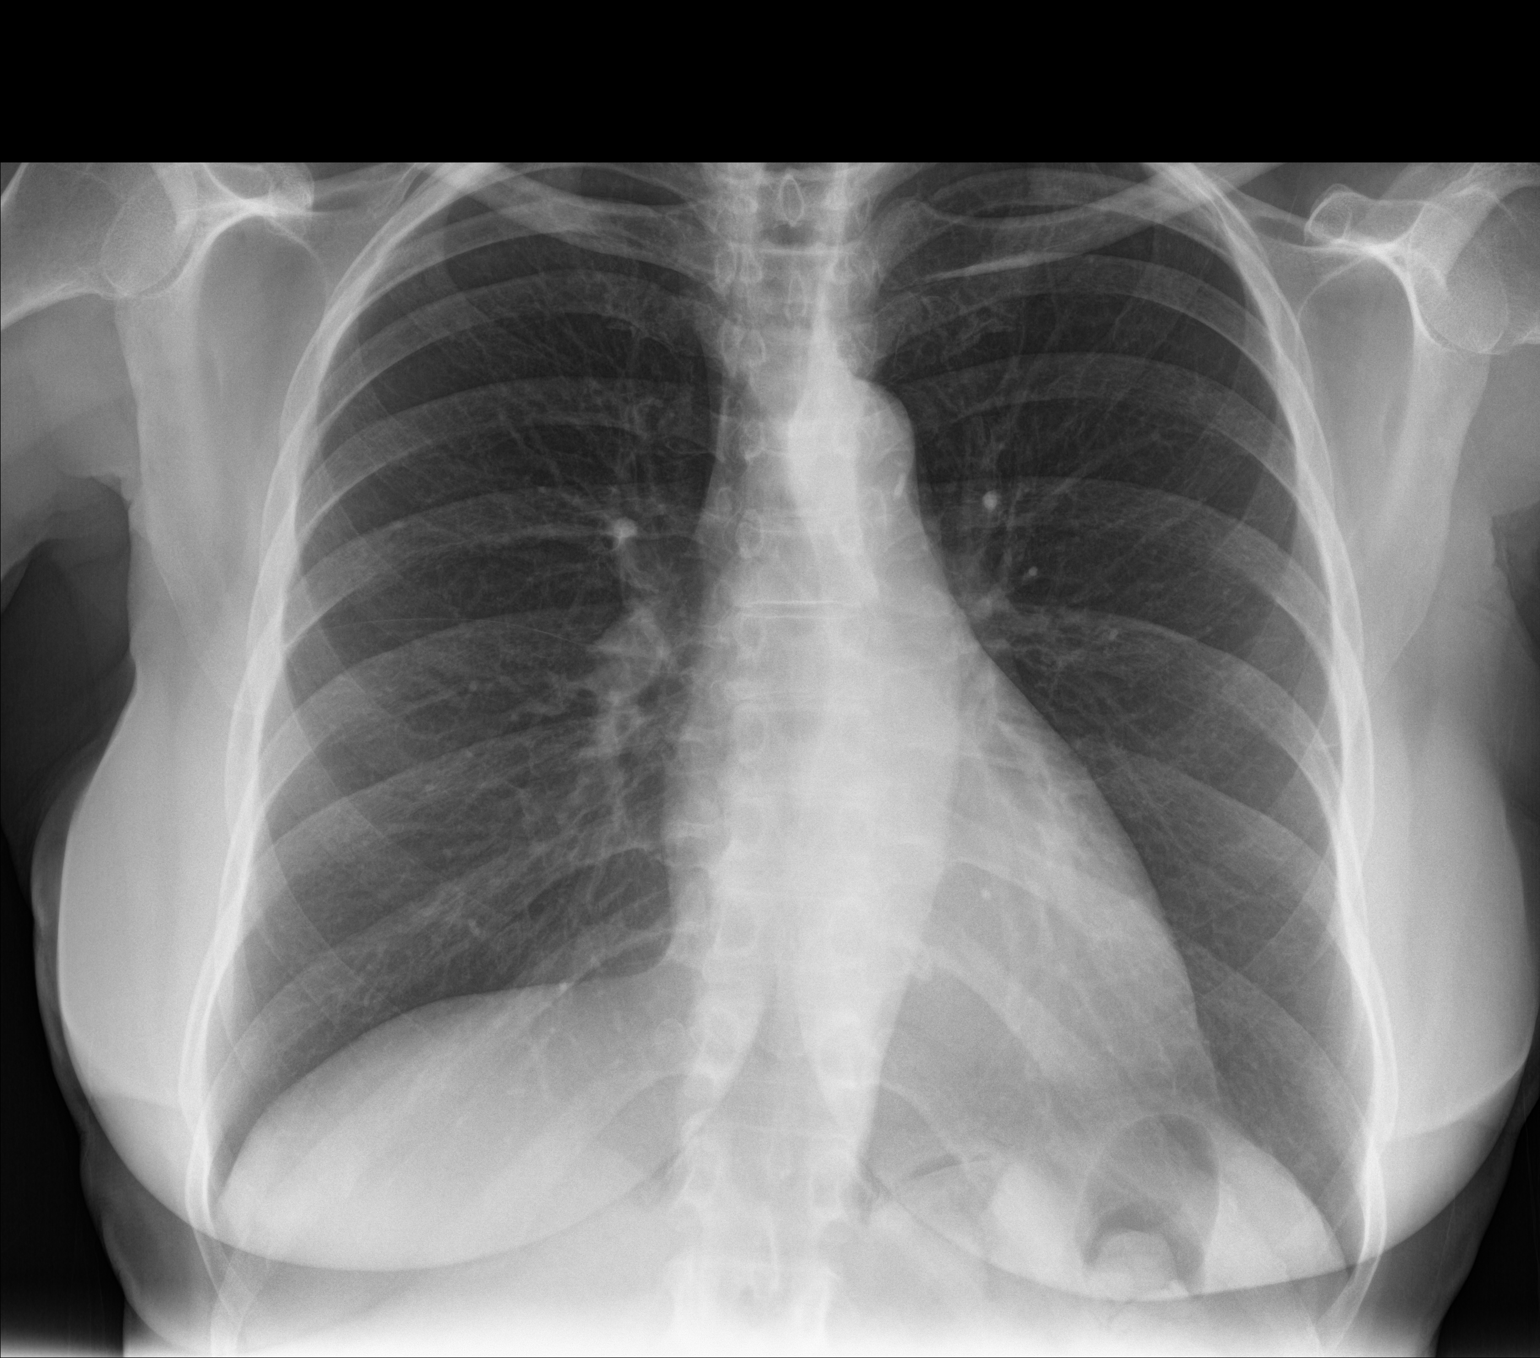

[chest lat]
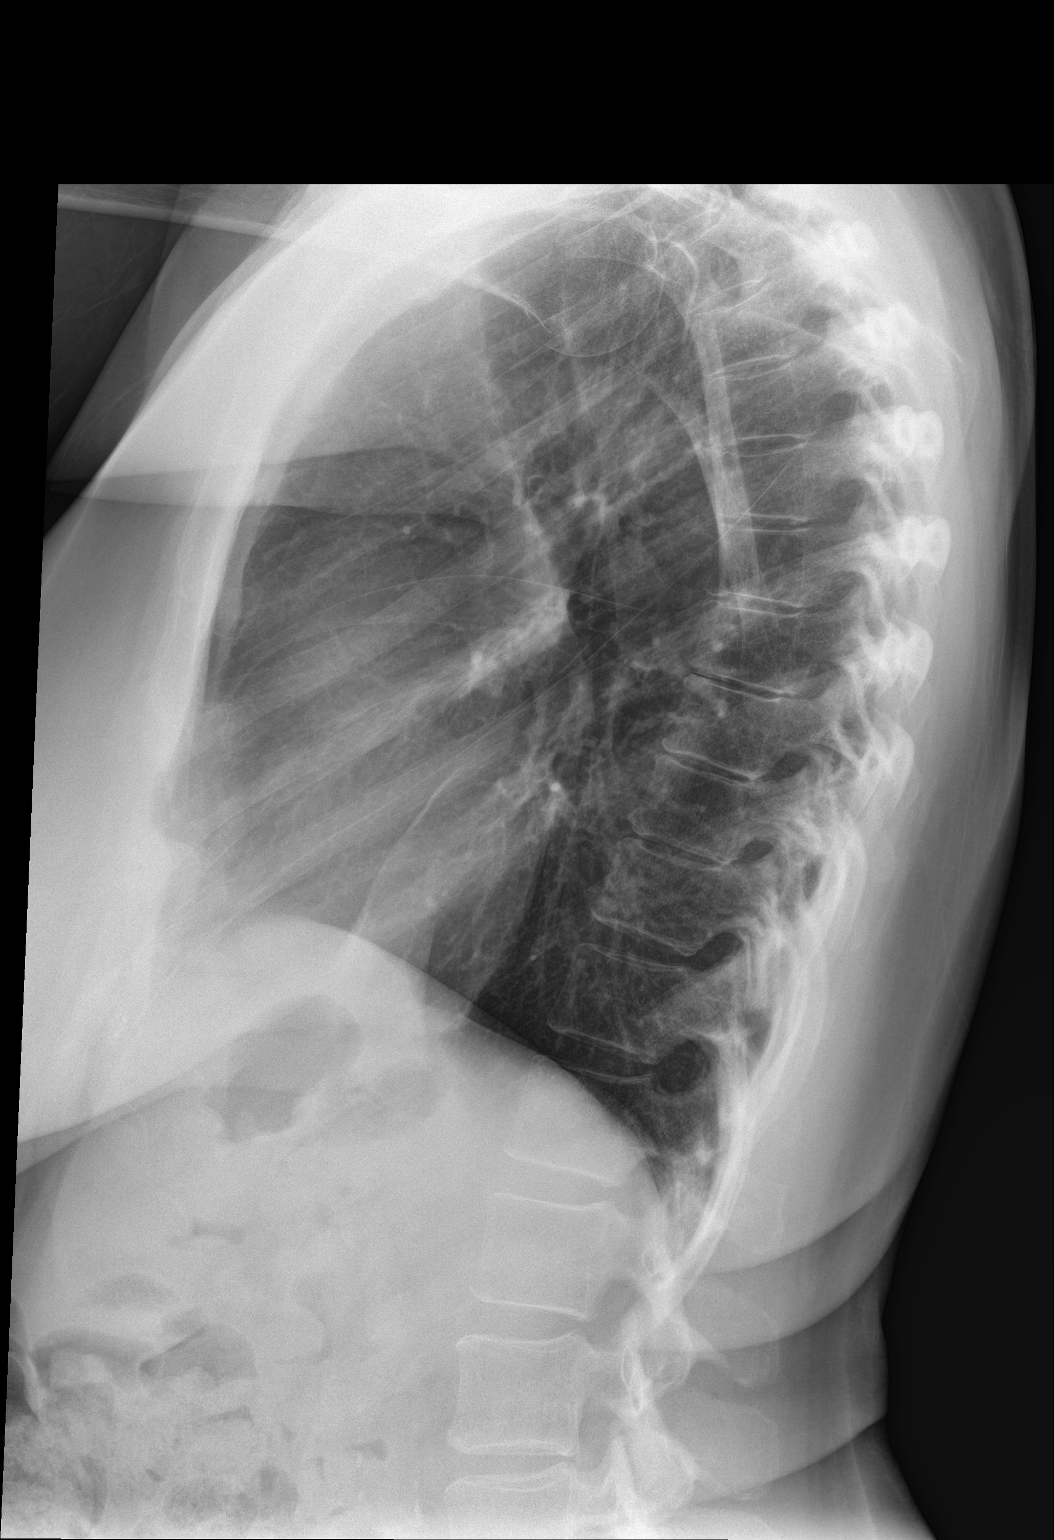

[2 of 2 positions shown; findings below may reference images not displayed]

FINDINGS: Lungs are clear.

Heart size and mediastinal contours are within normal limits.
Tortuous atheromatous aorta.

No effusion.

Visualized bones unremarkable.
IMPRESSION: No acute cardiopulmonary disease.

## 2017-10-21 ENCOUNTER — Ambulatory Visit (INDEPENDENT_AMBULATORY_CARE_PROVIDER_SITE_OTHER): Payer: PRIVATE HEALTH INSURANCE | Admitting: Family Medicine

## 2017-10-21 ENCOUNTER — Encounter: Payer: Self-pay | Admitting: Family Medicine

## 2017-10-21 DIAGNOSIS — G2581 Restless legs syndrome: Secondary | ICD-10-CM | POA: Diagnosis not present

## 2017-10-21 DIAGNOSIS — I10 Essential (primary) hypertension: Secondary | ICD-10-CM | POA: Diagnosis not present

## 2017-10-21 DIAGNOSIS — E785 Hyperlipidemia, unspecified: Secondary | ICD-10-CM

## 2017-10-21 MED ORDER — ROPINIROLE HCL 2 MG PO TABS: 2.0000 mg | ORAL_TABLET | Freq: Every day | ORAL | 1 refills | Status: DC

## 2017-10-21 MED ORDER — OLMESARTAN MEDOXOMIL 40 MG PO TABS: 40.0000 mg | ORAL_TABLET | Freq: Every day | ORAL | 1 refills | Status: DC

## 2017-10-21 MED ORDER — ATORVASTATIN CALCIUM 40 MG PO TABS: 40.0000 mg | ORAL_TABLET | Freq: Every day | ORAL | 1 refills | Status: DC

## 2017-10-21 MED ORDER — CARVEDILOL 25 MG PO TABS: 25.0000 mg | ORAL_TABLET | Freq: Two times a day (BID) | ORAL | 1 refills | Status: DC

## 2017-10-21 NOTE — Progress Notes (Signed)
Name: Alyssa Lawson   MRN: [id number]    DOB: [birth year]   Date:10/21/2017       Progress Note  Subjective  Chief Complaint  Chief Complaint  Patient presents with  . Hypertension    HPI  HTN: she has been monitoring her bp at home and has been well controlled in the 120's range, no tachycardia or chest pain. She was seen by Dr. [name] back in March 2019 and bp was very high and it matched the reading of her home bp monitor. He stated component of white coat hypertension and to keep monitoring at home. We switched her from Carvedilol to bystolic one month ago but bp is unchanged and bystolic costs too much, therefore we will go back on carvedilol   Patient Active Problem List   Diagnosis Date Noted  . Atrial tachycardia ([place]) 08/25/2017  . Unstable angina ([place]) 05/01/2017  . Positive cardiac stress test 05/01/2017  . Chest pain 04/24/2017  . Abnormal EKG 04/24/2017  . Smoking history 04/24/2017  . Rotator cuff tendinitis, left 04/10/2017  . Cataracts, bilateral 11/25/2016  . Impingement syndrome of shoulder, left 10/13/2016  . Chronic bilateral low back pain without sciatica 08/20/2016  . Metabolic syndrome 08/65/7846  . Mitral regurgitation 06/22/2015  . Melasma 12/21/2014  . History of hysterectomy 12/21/2014  . Restless leg syndrome 12/21/2014  . Benign essential HTN 12/17/2014  . Cataract 12/17/2014  . Dyslipidemia 12/17/2014  . Gastric reflux 12/17/2014  . History of cervical cancer 12/17/2014  . H/O iron deficiency anemia 12/17/2014  . Urinary incontinence in female 12/17/2014  . Dysmetabolic syndrome 96/29/5284  . TI (tricuspid incompetence) 12/17/2014  . Osteopenia of the elderly 12/17/2014    Past Surgical History:  Procedure Laterality Date  . ABDOMINAL HYSTERECTOMY  1976  . LEFT HEART CATH AND CORONARY ANGIOGRAPHY N/A 05/01/2017   Procedure: LEFT HEART CATH AND CORONARY ANGIOGRAPHY;  Surgeon: Minna Merritts, MD;  Location: [facility];  Service:  Cardiovascular;  Laterality: N/A;  . OOPHORECTOMY      Family History  Problem Relation Age of Onset  . Heart failure Father   . Arrhythmia Father   . Hypertension Father   . Arrhythmia Sister   . Hypertension Sister   . Congestive Heart Failure Brother   . Hypertension Brother   . Cardiomyopathy Sister   . Diabetes Brother   . Breast cancer Paternal Aunt 22    Social History   Socioeconomic History  . Marital status: Married    Spouse name: Not on file  . Number of children: Not on file  . Years of education: Not on file  . Highest education level: Not on file  Occupational History  . Not on file  Social Needs  . Financial resource strain: Not on file  . Food insecurity:    Worry: Not on file    Inability: Not on file  . Transportation needs:    Medical: Not on file    Non-medical: Not on file  Tobacco Use  . Smoking status: Former Smoker    Packs/day: 0.50    Years: 18.00    Pack years: 9.00    Types: Cigarettes    Start date: 06/23/1956    Last attempt to quit: 11/22/1995    Years since quitting: 21.9  . Smokeless tobacco: Former Systems developer    Quit date: 11/22/1996  Substance and Sexual Activity  . Alcohol use: Yes    Alcohol/week: 0.0 oz    Comment: socially  .  Drug use: No  . Sexual activity: Yes    Partners: Male  Lifestyle  . Physical activity:    Days per week: Not on file    Minutes per session: Not on file  . Stress: Not on file  Relationships  . Social connections:    Talks on phone: Not on file    Gets together: Not on file    Attends religious service: Not on file    Active member of club or organization: Not on file    Attends meetings of clubs or organizations: Not on file    Relationship status: Not on file  . Intimate partner violence:    Fear of current or ex partner: Not on file    Emotionally abused: Not on file    Physically abused: Not on file    Forced sexual activity: Not on file  Other Topics Concern  . 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  acetaminophen (TYLENOL) 500 MG tablet, Take 1 tablet (500 mg total) by mouth every 6 (six) hours as needed for headache., Disp: 30 tablet, Rfl: 0 .  aspirin 81 MG chewable tablet, Chew 1 tablet by mouth daily., Disp: , Rfl:  .  atorvastatin (LIPITOR) 40 MG tablet, Take 1 tablet (40 mg total) by mouth daily., Disp: 90 tablet, Rfl: 1 .  cloNIDine (CATAPRES) 0.1 MG tablet, Take 1 tablet (0.1 mg total) by mouth every evening., Disp: 90 tablet, Rfl: 1 .  ferrous sulfate 325 (65 FE) MG tablet, Take 325 mg by mouth daily with breakfast., Disp: , Rfl:  .  fluticasone (FLONASE) 50 MCG/ACT nasal spray, USE 1 SPRAY IN EACH NOSTRIL TWO TIMES DAILY, Disp: 48 g, Rfl: 1 .  hydrALAZINE (APRESOLINE) 50 MG tablet, Take 1 tablet (50 mg total) by mouth 3 (three) times daily., Disp: 270 tablet, Rfl: 3 .  Magnesium Oxide 400 (240 MG) MG TABS, Take 1 tablet by mouth daily., Disp: , Rfl:  .  methocarbamol (ROBAXIN) 500 MG tablet, Take 1 tablet by mouth 2 (two) times daily., Disp: , Rfl: 1 .  Multiple Vitamin (MULTIVITAMIN) capsule, Take 1 capsule by mouth daily., Disp: , Rfl:  .  olmesartan (BENICAR) 40 MG tablet, Take 1 tablet (40 mg total) by mouth daily., Disp: 90 tablet, Rfl: 1 .  oxybutynin (DITROPAN) 5 MG tablet, Take 2 tablets (10 mg total) by mouth 2 (two) times daily., Disp: 360 tablet, Rfl: 1 .  rOPINIRole (REQUIP) 2 MG tablet, Take 1 tablet (2 mg total) by mouth at bedtime., Disp: 90 tablet, Rfl: 1 .  valACYclovir (VALTREX) 500 MG tablet, TAKE 1 TABLET BY MOUTH 2  TIMES DAILY. PER EPISODE AS NEEDED, TAKE FOR 10 DAYS (Patient taking differently: TAKE 1 TABLET BY MOUTH 2  TIMES DAILY AS NEEDED. PER EPISODE AS NEEDED, TAKE FOR 10 DAYS), Disp: 90 tablet, Rfl: 0 .  carvedilol (COREG) 25 MG tablet, Take 1  tablet (25 mg total) by mouth 2 (two) times daily with a meal., Disp: 180 tablet, Rfl: 1 .  isosorbide mononitrate (IMDUR) 30 MG 24 hr tablet, Take 1 tablet (30 mg total) by mouth daily., Disp: 90 tablet, Rfl: 3  Allergies  Allergen Reactions  . Amlodipine Swelling     ROS  Constitutional: Negative for fever  or weight change.  Respiratory: Negative for cough and shortness of breath.   Cardiovascular: Negative for chest pain or palpitations.  Gastrointestinal: Negative for abdominal pain, no bowel changes.  Musculoskeletal: Negative for gait problem or joint swelling.  Skin: Negative for rash.  Neurological: Negative for dizziness or headache.  No other specific complaints in a complete review of systems (except as listed in HPI above).  Objective  Vitals:   10/21/17 1315 10/21/17 1342  BP: (!) 160/90 (!) 170/90  Pulse: 63   Resp: 16   SpO2: 95%   Weight: 165 lb 14.4 oz (75.3 kg)   Height: 5\' 5"  (1.651 m)     Body mass index is 27.61 kg/m.  Physical Exam  Constitutional: Patient appears well-developed and well-nourished. Overweight. No distress.  HEENT: head atraumatic, normocephalic, pupils equal and reactive to light, neck supple, throat within normal limits Cardiovascular: Normal rate, regular rhythm and normal heart sounds.  No murmur heard. No BLE edema. Pulmonary/Chest: Effort normal and breath sounds normal. No respiratory distress. Abdominal: Soft.  There is no tenderness. Psychiatric: Patient has a normal mood and affect. behavior is normal. Judgment and thought content normal.  Recent Results (from the past 2160 hour(s))  POCT HgB A1C     Status: Normal   Collection Time: 08/21/17  3:17 PM  Result Value Ref Range   Hemoglobin A1C 5.9   POCT UA - Microalbumin     Status: Abnormal   Collection Time: 08/21/17  3:18 PM  Result Value Ref Range   Microalbumin Ur, POC 20 mg/L   Creatinine, POC  mg/dL   Albumin/Creatinine Ratio, Urine, POC    CBC with  Differential/Platelet     Status: None   Collection Time: 09/21/17  2:12 PM  Result Value Ref Range   WBC 6.2 3.8 - 10.8 Thousand/uL   RBC 4.65 3.80 - 5.10 Million/uL   Hemoglobin 14.2 11.7 - 15.5 g/dL   HCT 40.5 35.0 - 45.0 %   MCV 87.1 80.0 - 100.0 fL   MCH 30.5 27.0 - 33.0 pg   MCHC 35.1 32.0 - 36.0 g/dL   RDW 12.5 11.0 - 15.0 %   Platelets 233 140 - 400 Thousand/uL   MPV 11.5 7.5 - 12.5 fL   Neutro Abs 3,708 1,500 - 7,800 cells/uL   Lymphs Abs 1,668 850 - 3,900 cells/uL   WBC mixed population 484 200 - 950 cells/uL   Eosinophils Absolute 279 15 - 500 cells/uL   Basophils Absolute 62 0 - 200 cells/uL   Neutrophils Relative % 59.8 %   Total Lymphocyte 26.9 %   Monocytes Relative 7.8 %   Eosinophils Relative 4.5 %   Basophils Relative 1.0 %  COMPLETE METABOLIC PANEL WITH GFR     Status: Abnormal   Collection Time: 09/21/17  2:12 PM  Result Value Ref Range   Glucose, Bld 88 65 - 99 mg/dL    Comment: .            Fasting reference interval .    BUN 19 7 - 25 mg/dL   Creat 1.05 (H) 0.60 - 0.93 mg/dL    Comment: For patients >96 years of age, the reference limit for Creatinine is approximately 13% higher for people identified as African-American. .    GFR, Est Non African American 53 (L) > OR = 60 mL/min/1.45m2   GFR, Est African American 62 > OR = 60 mL/min/1.50m2   BUN/Creatinine Ratio 18 6 - 22 (calc)   Sodium 140  135 - 146 mmol/L   Potassium 4.5 3.5 - 5.3 mmol/L   Chloride 103 98 - 110 mmol/L   CO2 31 20 - 32 mmol/L   Calcium 10.0 8.6 - 10.4 mg/dL   Total Protein 7.3 6.1 - 8.1 g/dL   Albumin 4.6 3.6 - 5.1 g/dL   Globulin 2.7 1.9 - 3.7 g/dL (calc)   AG Ratio 1.7 1.0 - 2.5 (calc)   Total Bilirubin 0.6 0.2 - 1.2 mg/dL   Alkaline phosphatase (APISO) 64 33 - 130 U/L   AST 25 10 - 35 U/L   ALT 29 6 - 29 U/L  Lipid panel     Status: None   Collection Time: 09/21/17  2:12 PM  Result Value Ref Range   Cholesterol 163 <200 mg/dL   HDL 69 >50 mg/dL   Triglycerides 101  <150 mg/dL   LDL Cholesterol (Calc) 76 mg/dL (calc)    Comment: Reference range: <100 . Desirable range <100 mg/dL for primary prevention;   <70 mg/dL for patients with CHD or diabetic patients  with > or = 2 CHD risk factors. Marland Kitchen LDL-C is now calculated using the Martin-Hopkins  calculation, which is a validated novel method providing  better accuracy than the Friedewald equation in the  estimation of LDL-C.  Cresenciano Genre et al. Annamaria Helling. 3419;379(02): 2061-2068  (http://education.[facility].com/faq/FAQ164)    Total CHOL/HDL Ratio 2.4 <5.0 (calc)   Non-HDL Cholesterol (Calc) 94 <130 mg/dL (calc)    Comment: For patients with diabetes plus 1 major ASCVD risk  factor, treating to a non-HDL-C goal of <100 mg/dL  (LDL-C of <70 mg/dL) is considered a therapeutic  option.      PHQ2/9: Depression screen [facility] 2/9 08/21/2017 02/25/2017 08/20/2016 02/20/2016 06/22/2015  Decreased Interest 0 0 0 0 0  Down, Depressed, Hopeless 0 0 0 0 0  PHQ - 2 Score 0 0 0 0 0     Fall Risk: Fall Risk  10/21/2017 09/21/2017 08/21/2017 02/25/2017 08/20/2016  Falls in the past year? No No No No No  Number falls in past yr: - - - - -  Injury with Fall? - - - - -    Functional Status Survey: Is the patient deaf or have difficulty hearing?: No Does the patient have difficulty seeing, even when wearing glasses/contacts?: No Does the patient have difficulty concentrating, remembering, or making decisions?: No Does the patient have difficulty walking or climbing stairs?: No Does the patient have difficulty dressing or bathing?: No Does the patient have difficulty doing errands alone such as visiting a doctor's office or shopping?: No   Assessment & Plan  1. Benign essential HTN  We will go back on Bystolic since she saw Dr. [name] and there is a component of white coat hypertension, bp at home has been well controlled and bystolic is too expensive - carvedilol (COREG) 25 MG tablet; Take 1 tablet (25 mg total) by  mouth 2 (two) times daily with a meal.  Dispense: 180 tablet; Refill: 1 - olmesartan (BENICAR) 40 MG tablet; Take 1 tablet (40 mg total) by mouth daily.  Dispense: 90 tablet; Refill: 1  2. RLS (restless legs syndrome)  - rOPINIRole (REQUIP) 2 MG tablet; Take 1 tablet (2 mg total) by mouth at bedtime.  Dispense: 90 tablet; Refill: 1  3. Dyslipidemia  - atorvastatin (LIPITOR) 40 MG tablet; Take 1 tablet (40 mg total) by mouth daily.  Dispense: 90 tablet; Refill: 1

## 2017-10-24 ENCOUNTER — Encounter: Payer: Self-pay | Admitting: Family Medicine

## 2018-01-12 ENCOUNTER — Telehealth: Payer: Self-pay | Admitting: Family Medicine

## 2018-01-12 DIAGNOSIS — N3941 Urge incontinence: Secondary | ICD-10-CM

## 2018-01-13 MED ORDER — OXYBUTYNIN CHLORIDE 5 MG PO TABS: 10.0000 mg | ORAL_TABLET | Freq: Two times a day (BID) | ORAL | 0 refills | Status: DC

## 2018-01-13 NOTE — Telephone Encounter (Signed)
She needs regular follow up. Last regular visit was 08/2017

## 2018-01-13 NOTE — Telephone Encounter (Signed)
Spoke with patient and she has scheduled an appointment with Alyssa Lawson for next week because you are completely booked

## 2018-01-20 ENCOUNTER — Ambulatory Visit (INDEPENDENT_AMBULATORY_CARE_PROVIDER_SITE_OTHER): Payer: PRIVATE HEALTH INSURANCE | Admitting: Family Medicine

## 2018-01-20 ENCOUNTER — Other Ambulatory Visit: Payer: Self-pay | Admitting: Family Medicine

## 2018-01-20 ENCOUNTER — Encounter: Payer: Self-pay | Admitting: Family Medicine

## 2018-01-20 ENCOUNTER — Other Ambulatory Visit: Payer: Self-pay

## 2018-01-20 VITALS — BP 128/76 | HR 66 | Temp 98.3°F | Resp 16 | Ht 65.0 in | Wt 172.1 lb

## 2018-01-20 DIAGNOSIS — I200 Unstable angina: Secondary | ICD-10-CM | POA: Diagnosis not present

## 2018-01-20 DIAGNOSIS — M7582 Other shoulder lesions, left shoulder: Secondary | ICD-10-CM

## 2018-01-20 DIAGNOSIS — R7309 Other abnormal glucose: Secondary | ICD-10-CM | POA: Diagnosis not present

## 2018-01-20 DIAGNOSIS — E8881 Metabolic syndrome: Secondary | ICD-10-CM

## 2018-01-20 DIAGNOSIS — N3941 Urge incontinence: Secondary | ICD-10-CM

## 2018-01-20 DIAGNOSIS — Z23 Encounter for immunization: Secondary | ICD-10-CM | POA: Diagnosis not present

## 2018-01-20 DIAGNOSIS — G2581 Restless legs syndrome: Secondary | ICD-10-CM

## 2018-01-20 DIAGNOSIS — R32 Unspecified urinary incontinence: Secondary | ICD-10-CM

## 2018-01-20 DIAGNOSIS — M19049 Primary osteoarthritis, unspecified hand: Secondary | ICD-10-CM | POA: Diagnosis not present

## 2018-01-20 DIAGNOSIS — I10 Essential (primary) hypertension: Secondary | ICD-10-CM | POA: Diagnosis not present

## 2018-01-20 DIAGNOSIS — M545 Low back pain: Secondary | ICD-10-CM

## 2018-01-20 DIAGNOSIS — M85649 Other cyst of bone, unspecified hand: Secondary | ICD-10-CM

## 2018-01-20 DIAGNOSIS — E785 Hyperlipidemia, unspecified: Secondary | ICD-10-CM

## 2018-01-20 DIAGNOSIS — R7303 Prediabetes: Secondary | ICD-10-CM

## 2018-01-20 DIAGNOSIS — G8929 Other chronic pain: Secondary | ICD-10-CM

## 2018-01-20 MED ORDER — SOLIFENACIN SUCCINATE 5 MG PO TABS: 5.0000 mg | ORAL_TABLET | Freq: Every day | ORAL | 1 refills | Status: DC

## 2018-01-20 NOTE — Progress Notes (Addendum)
Name: Alyssa Lawson   MRN: [id number]    DOB: [birth year]   Date:01/20/2018       Progress Note  Subjective  Chief Complaint  Chief Complaint  Patient presents with  . Medication Refill    HPI  HTN:  -does take medications as prescribed - current regimen includes carvedilol 25mg , olmesarta 40mg , and clonidine 0.1 once at night daily; also taking hydralazine 50mg  TID. Taking ASA daily, taking statin. - taking medications as instructed, no medication side effects noted, no TIAs, no chest pain on exertion, no dyspnea on exertion, no swelling of ankles, no orthostatic dizziness or lightheadedness, headaches, vision changes, chest pain.  BP is at goal today. - DASH diet discussed - pt tries to follow but says she needs to do better follow a low sodium diet; salt added to cooking, salt shaker not on table and diet: general - The following  are contributing factors: stress, sedentary lifestyle, saturated fats in diet.  Hyperlipidemia: last labs done last April 2019 and she is on Atorvastatin at bedtime. Denies side effects, no myalgias  Current Medication Regimen: Atorvastatin 40mg , ASA 81mg  daily. Last Lipids: Lab Results  Component Value Date   CHOL 163 09/21/2017   CHOL 164 08/29/2016   CHOL 225 (H) 06/22/2015   Lab Results  Component Value Date   HDL 69 09/21/2017   HDL 81 08/29/2016   HDL 76 06/22/2015   Lab Results  Component Value Date   LDLCALC 76 09/21/2017   LDLCALC 69 08/29/2016   LDLCALC 123 (H) 06/22/2015   Lab Results  Component Value Date   TRIG 101 09/21/2017   TRIG 69 08/29/2016   TRIG 128 06/22/2015   Lab Results  Component Value Date   CHOLHDL 2.4 09/21/2017   CHOLHDL 2.0 08/29/2016   CHOLHDL 3.0 06/22/2015   No results found for: LDLDIRECT - Current Diet: General - Denies: Chest pain, shortness of breath, myalgias. - Documented aortic atherosclerosis? No - Risk factors for atherosclerosis: hypercholesterolemia and hypertension; former  smoker  Metabolic Syndrome: Last I3H was 08/21/2017 5.9%. She denies polyphagia or polydipsia, she has urinary frequency and incontinence (see below) and has gained 2 pounds since last visit. She stopped taking metformin due to pt c/o swelling and weight gain with medication. Pt was started on Trulicity back in 68/6168 and is doing well but notes price is too hight. She has been slipping a bit with her diet and will continue to work hard on life style modification; exercise has been limited due to how hot it has been - does a lot stretching and movements.   Urinary incontinence: she has been on Ditropan for many years- takes BID, but symptoms got worse, so we switched to [facility] however she states too expensive and we switched to back Oxybutin - only gets up once at night. Pt stopped drinking tea at night. Dose was increased at last visit and seems to be helping, though she notes that Vesicare seemed to work much better overall for her.  RLS: better with medication, sleeping well with medication,- Requip - still very seldom has symptoms.   Chronic Back Pain: she has daily low back pain that isn't bad right now.  She takes Tylenol prn, motrin PRN, She left her job back in May 2018 - she is taking classes instead now - taking some computer clases, and these are not hard on her back.  Wheezing: resolved, off Breo, remote history of tobacco use, no wheezing, cough or SOB since last  episode of bronchitis back in July 2018. Pt uses flonase BID sts feels much better has mild nasal congestion during the winter but its clearing up.   Rotator cuff tendinitis left shoulder: doing well since seen by Dr. Sabra Heck and taking muscle relaxer with NSAID.  She denies symptoms today  Unstable angina: indigestion symptoms with abnormal EKG, normal cardiac cath done Nov 2018 by Dr. [name], on higher dose of Coreg, and Benicar also on Imdur daily and hydralazine TID and indigestion improved. Also on statin therapy and  aspirin.  Saw Dr. [name] 08/25/17 - will return in 1 year.  Cystic Lesion LEFT Hand: She noticed two small cystic lesions developing on the palms of her left hand earlier in the year. Notes these areas are a little tender, firm, and not moveable; does not limit her ROM.  Pain in bilateral hands: she notes arthritis in her fingers that has been painful - she has tried motrin but it doesn't seem to help.  She notes that she feels like her fingers are a bit swollen - has trouble getting her rings on and off.  Dad had osteoarthritis, she says it runs in her family; no personal or family history of RA or other arthritis dx's.  Patient Active Problem List   Diagnosis Date Noted  . Atrial tachycardia ([place]) 08/25/2017  . Unstable angina ([place]) 05/01/2017  . Positive cardiac stress test 05/01/2017  . Chest pain 04/24/2017  . Abnormal EKG 04/24/2017  . Smoking history 04/24/2017  . Rotator cuff tendinitis, left 04/10/2017  . Cataracts, bilateral 11/25/2016  . Impingement syndrome of shoulder, left 10/13/2016  . Chronic bilateral low back pain without sciatica 08/20/2016  . Metabolic syndrome 78/29/5621  . Mitral regurgitation 06/22/2015  . Melasma 12/21/2014  . History of hysterectomy 12/21/2014  . Restless leg syndrome 12/21/2014  . Benign essential HTN 12/17/2014  . Cataract 12/17/2014  . Dyslipidemia 12/17/2014  . Gastric reflux 12/17/2014  . History of cervical cancer 12/17/2014  . H/O iron deficiency anemia 12/17/2014  . Urinary incontinence in female 12/17/2014  . Dysmetabolic syndrome 30/86/5784  . TI (tricuspid incompetence) 12/17/2014  . Osteopenia of the elderly 12/17/2014    Past Surgical History:  Procedure Laterality Date  . ABDOMINAL HYSTERECTOMY  1976  . LEFT HEART CATH AND CORONARY ANGIOGRAPHY N/A 05/01/2017   Procedure: LEFT HEART CATH AND CORONARY ANGIOGRAPHY;  Surgeon: Minna Merritts, MD;  Location: [facility];  Service: Cardiovascular;  Laterality: N/A;  .  OOPHORECTOMY      Family History  Problem Relation Age of Onset  . Heart failure Father   . Arrhythmia Father   . Hypertension Father   . Arrhythmia Sister   . Hypertension Sister   . Congestive Heart Failure Brother   . Hypertension Brother   . Cardiomyopathy Sister   . Diabetes Brother   . Breast cancer Paternal Aunt 7    Social History   Socioeconomic History  . Marital status: Married    Spouse name: Not on file  . Number of children: Not on file  . Years of education: Not on file  . Highest education level: Not on file  Occupational History  . Not on file  Social Needs  . Financial resource strain: Not on file  . Food insecurity:    Worry: Not on file    Inability: Not on file  . Transportation needs:    Medical: Not on file    Non-medical: Not on file  Tobacco Use  .  Smoking status: Former Smoker    Packs/day: 0.50    Years: 18.00    Pack years: 9.00    Types: Cigarettes    Start date: 06/23/1956    Last attempt to quit: 11/22/1995    Years since quitting: 22.1  . Smokeless tobacco: Former Systems developer    Quit date: 11/22/1996  Substance and Sexual Activity  . Alcohol use: Yes    Alcohol/week: 0.0 oz    Comment: socially  . Drug use: No  . Sexual activity: Yes    Partners: Male  Lifestyle  . Physical activity:    Days per week: Not on file    Minutes per session: Not on file  . Stress: Not on file  Relationships  . Social connections:    Talks on phone: Not on file    Gets together: Not on file    Attends religious service: Not on file    Active member of club or organization: Not on file    Attends meetings of clubs or organizations: Not on file    Relationship status: Not on file  . Intimate partner violence:    Fear of current or ex partner: Not on file    Emotionally abused: Not on file    Physically abused: Not on file    Forced sexual activity: Not on file  Other Topics Concern  . 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  acetaminophen (TYLENOL) 500 MG tablet, Take 1 tablet (500 mg total) by mouth every 6 (six) hours as needed for headache., Disp: 30 tablet, Rfl: 0 .  aspirin 81 MG chewable tablet, Chew 1 tablet by mouth daily., Disp: , Rfl:  .  atorvastatin (LIPITOR) 40 MG tablet, Take 1 tablet (40 mg total) by mouth daily., Disp: 90 tablet, Rfl: 1 .  carvedilol (COREG) 25 MG tablet, Take 1 tablet (25 mg total) by mouth 2 (two) times daily with a meal., Disp: 180 tablet, Rfl: 1 .  cloNIDine (CATAPRES) 0.1 MG tablet, Take 1 tablet (0.1 mg total) by mouth every evening., Disp: 90 tablet, Rfl: 1 .  ferrous sulfate 325 (65 FE) MG tablet, Take 325 mg by mouth daily with breakfast., Disp: , Rfl:  .  fluticasone (FLONASE) 50 MCG/ACT nasal spray, USE 1 SPRAY IN EACH NOSTRIL TWO TIMES DAILY, Disp: 48 g, Rfl: 1 .  Magnesium Oxide 400 (240 MG) MG TABS, Take 1 tablet by mouth daily., Disp: , Rfl:  .  Multiple Vitamin (MULTIVITAMIN) capsule, Take 1 capsule by mouth daily., Disp: , Rfl:  .  olmesartan (BENICAR) 40 MG tablet, Take 1 tablet (40 mg total) by mouth daily., Disp: 90 tablet, Rfl: 1 .  oxybutynin (DITROPAN) 5 MG tablet, Take 2 tablets (10 mg total) by mouth 2 (two) times daily., Disp: 360 tablet, Rfl: 0 .  rOPINIRole (REQUIP) 2 MG tablet, Take 1 tablet (2 mg total) by mouth at bedtime., Disp: 90 tablet, Rfl: 1 .  valACYclovir (VALTREX) 500 MG tablet, TAKE 1 TABLET BY MOUTH 2  TIMES DAILY. PER EPISODE AS NEEDED, TAKE FOR 10 DAYS (Patient taking differently: TAKE 1 TABLET BY MOUTH 2  TIMES DAILY AS NEEDED. PER EPISODE AS NEEDED, TAKE FOR 10 DAYS), Disp: 90 tablet, Rfl: 0 .  hydrALAZINE (APRESOLINE) 50 MG tablet,  Take 1 tablet (50 mg total) by mouth 3 (three) times daily., Disp: 270 tablet, Rfl: 3 .  isosorbide mononitrate (IMDUR) 30 MG 24 hr  tablet, Take 1 tablet (30 mg total) by mouth daily., Disp: 90 tablet, Rfl: 3 .  methocarbamol (ROBAXIN) 500 MG tablet, Take 1 tablet by mouth 2 (two) times daily., Disp: , Rfl: 1  Allergies  Allergen Reactions  . Amlodipine Swelling    ROS Constitutional: Negative for fever or weight change.  Respiratory: Negative for cough and shortness of breath.   Cardiovascular: Negative for chest pain or palpitations.  Gastrointestinal: Negative for abdominal pain, no bowel changes.  Musculoskeletal: See HPI Skin: Negative for rash.  Neurological: Negative for dizziness or headache.  No other specific complaints in a complete review of systems (except as listed in HPI above).  Objective  Vitals:   01/20/18 1054  BP: 128/76  Pulse: 66  Resp: 16  Temp: 98.3 F (36.8 C)  TempSrc: Oral  SpO2: 94%  Weight: 172 lb 1.6 oz (78.1 kg)  Height: 5\' 5"  (1.651 m)   Body mass index is 28.64 kg/m.  Physical Exam  Constitutional: She is oriented to person, place, and time. She appears well-developed and well-nourished.  HENT:  Head: Normocephalic and atraumatic.  Right Ear: External ear normal.  Left Ear: External ear normal.  Nose: Nose normal.  Mouth/Throat: Mucous membranes are normal. No tonsillar exudate.  Eyes: Pupils are equal, round, and reactive to light. Conjunctivae and EOM are normal. No scleral icterus.  Neck: Normal range of motion. Neck supple.  Cardiovascular: Normal rate, regular rhythm, normal heart sounds and intact distal pulses.  Pulmonary/Chest: Effort normal and breath sounds normal.  Musculoskeletal: Normal range of motion. She exhibits no edema.       Left hand: She exhibits tenderness. She exhibits normal range of motion and normal capillary refill.       Hands: Neurological: She is alert and oriented to person, place, and time. No cranial nerve deficit.  Skin: Skin is warm and dry. Capillary refill takes less than 2 seconds. No rash noted. No erythema.  Psychiatric:  She has a normal mood and affect. Her behavior is normal. Judgment and thought content normal.  Nursing note and vitals reviewed.  No results found for this or any previous visit (from the past 72 hour(s)).  PHQ2/9: Depression screen [facility] 2/9 01/20/2018 08/21/2017 02/25/2017 08/20/2016 02/20/2016  Decreased Interest 0 0 0 0 0  Down, Depressed, Hopeless 0 0 0 0 0  PHQ - 2 Score 0 0 0 0 0  Altered sleeping 0 - - - -  Tired, decreased energy 0 - - - -  Change in appetite 0 - - - -  Feeling bad or failure about yourself  0 - - - -  Trouble concentrating 0 - - - -  Moving slowly or fidgety/restless 0 - - - -  Suicidal thoughts 0 - - - -  PHQ-9 Score 0 - - - -  Difficult doing work/chores Not difficult at all - - - -    Fall Risk: Fall Risk  01/20/2018 10/21/2017 09/21/2017 08/21/2017 02/25/2017  Falls in the past year? No No No No No  Number falls in past yr: - - - - -  Injury with Fall? - - - - -   Assessment & Plan  1. Benign essential HTN - COMPLETE METABOLIC PANEL WITH GFR  2. Arthritis pain of hand - Advised may continue tylenol and motrin PRN - Ambulatory  referral to Orthopedic Surgery  3. Bone cyst of hand - unclear etiology at this time - recommend hand specialist for further evaluation. - Ambulatory referral to Orthopedic Surgery  4. Unstable angina (HCC) - Stable; continue follow up with cardiology, continue current medication regimen.  5. Dyslipidemia - Continue statin and aspirin therapy; lifestyle discussed.  6. Dysmetabolic syndrome - Discussed importance of 150 minutes of physical activity weekly, eat two servings of fish weekly, eat one serving of tree nuts ( cashews, pistachios, pecans, almonds.Marland Kitchen) every other day, eat 6 servings of fruit/vegetables daily and drink plenty of water and avoid sweet beverages.  - Hemoglobin A1c - COMPLETE METABOLIC PANEL WITH GFR  7. Urge incontinence - We will check on price of Vesicare for her with her new insurance; otherwise continue  ditropan.  8. Restless leg syndrome - Continue requip; stable  9. Urinary incontinence in female - We will check on price of Vesicare for her with her new insurance; otherwise continue ditropan.  10. Rotator cuff tendinitis, left - Resolved.  11. 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  12. Need for Tdap vaccination - Tdap vaccine greater than or equal to 7yo IM  -Reviewed Health Maintenance: TDAP; reminded pt to have eye exam updated.

## 2018-01-20 NOTE — Patient Instructions (Signed)
Your goal blood pressure is less than 140/90 mmHg on top. Try to follow the DASH guidelines (DASH stands for Dietary Approaches to Stop Hypertension) Try to limit the sodium in your diet.  Ideally, consume less than 1.5 grams (less than 1,500mg ) per day. Do not add salt when cooking or at the table.  Check the sodium amount on labels when shopping, and choose items lower in sodium when given a choice. Avoid or limit foods that already contain a lot of sodium. Eat a diet rich in fruits and vegetables and whole grains.

## 2018-01-21 DIAGNOSIS — R7303 Prediabetes: Secondary | ICD-10-CM | POA: Insufficient documentation

## 2018-01-21 LAB — COMPLETE METABOLIC PANEL WITH GFR
AG Ratio: 1.8 (calc) (ref 1.0–2.5)
ALBUMIN MSPROF: 4.2 g/dL (ref 3.6–5.1)
ALKALINE PHOSPHATASE (APISO): 75 U/L (ref 33–130)
ALT: 19 U/L (ref 6–29)
AST: 18 U/L (ref 10–35)
BILIRUBIN TOTAL: 0.6 mg/dL (ref 0.2–1.2)
BUN / CREAT RATIO: 22 (calc) (ref 6–22)
BUN: 22 mg/dL (ref 7–25)
CHLORIDE: 105 mmol/L (ref 98–110)
CO2: 29 mmol/L (ref 20–32)
Calcium: 9.7 mg/dL (ref 8.6–10.4)
Creat: 1.01 mg/dL — ABNORMAL HIGH (ref 0.60–0.93)
GFR, Est African American: 65 mL/min/{1.73_m2} (ref >=60)
GFR, Est Non African American: 56 mL/min/{1.73_m2} — ABNORMAL LOW (ref >=60)
GLOBULIN: 2.3 g/dL (ref 1.9–3.7)
Glucose, Bld: 112 mg/dL — ABNORMAL HIGH (ref 65–99)
Potassium: 4.3 mmol/L (ref 3.5–5.3)
SODIUM: 140 mmol/L (ref 135–146)
Total Protein: 6.5 g/dL (ref 6.1–8.1)

## 2018-01-21 LAB — HEMOGLOBIN A1C
EAG (MMOL/L): 7.3 (calc)
Hgb A1c MFr Bld: 6.2 % of total Hgb — ABNORMAL HIGH (ref <5.7)
Mean Plasma Glucose: 131 (calc)

## 2018-01-26 DIAGNOSIS — S63630A Sprain of interphalangeal joint of right index finger, initial encounter: Secondary | ICD-10-CM | POA: Diagnosis not present

## 2018-01-26 DIAGNOSIS — M720 Palmar fascial fibromatosis [Dupuytren]: Secondary | ICD-10-CM | POA: Diagnosis not present

## 2018-01-28 ENCOUNTER — Encounter: Payer: Self-pay | Admitting: Family Medicine

## 2018-01-28 DIAGNOSIS — M720 Palmar fascial fibromatosis [Dupuytren]: Secondary | ICD-10-CM | POA: Diagnosis not present

## 2018-02-25 ENCOUNTER — Other Ambulatory Visit: Payer: Self-pay

## 2018-02-25 ENCOUNTER — Other Ambulatory Visit: Payer: Self-pay | Admitting: *Deleted

## 2018-02-25 MED ORDER — ISOSORBIDE MONONITRATE ER 30 MG PO TB24: 30.0000 mg | ORAL_TABLET | Freq: Every day | ORAL | 3 refills | Status: DC

## 2018-03-02 ENCOUNTER — Ambulatory Visit: Payer: PRIVATE HEALTH INSURANCE

## 2018-03-02 NOTE — Progress Notes (Signed)
Pt arrived late for appt. Unable to be seen today. Rescheduled AWV. Refer to appts

## 2018-03-11 ENCOUNTER — Telehealth: Payer: Self-pay | Admitting: Family Medicine

## 2018-03-11 NOTE — Telephone Encounter (Signed)
Pt coming in today 03-11-18

## 2018-03-11 NOTE — Telephone Encounter (Signed)
Pt coming 03-11-18

## 2018-03-16 ENCOUNTER — Ambulatory Visit (INDEPENDENT_AMBULATORY_CARE_PROVIDER_SITE_OTHER): Payer: PRIVATE HEALTH INSURANCE

## 2018-03-16 ENCOUNTER — Other Ambulatory Visit: Payer: Self-pay

## 2018-03-16 VITALS — BP 140/60 | HR 58 | Temp 97.3°F | Resp 12 | Ht 65.0 in | Wt 168.4 lb

## 2018-03-16 DIAGNOSIS — Z1239 Encounter for other screening for malignant neoplasm of breast: Secondary | ICD-10-CM

## 2018-03-16 DIAGNOSIS — E2839 Other primary ovarian failure: Secondary | ICD-10-CM

## 2018-03-16 DIAGNOSIS — I10 Essential (primary) hypertension: Secondary | ICD-10-CM

## 2018-03-16 DIAGNOSIS — Z0000 Encounter for general adult medical examination without abnormal findings: Secondary | ICD-10-CM | POA: Diagnosis not present

## 2018-03-16 DIAGNOSIS — Z1231 Encounter for screening mammogram for malignant neoplasm of breast: Secondary | ICD-10-CM

## 2018-03-16 MED ORDER — CLONIDINE HCL 0.1 MG PO TABS: 0.1000 mg | ORAL_TABLET | Freq: Every evening | ORAL | 1 refills | Status: DC

## 2018-03-16 NOTE — Progress Notes (Signed)
Subjective:   Alyssa Lawson is a 72 y.o. female who presents for Medicare Annual (Subsequent) preventive examination.  Review of Systems:  N/A Cardiac Risk Factors include: advanced age (>97men, >61 women);diabetes mellitus;dyslipidemia;hypertension;sedentary lifestyle     Objective:     Vitals: BP 140/60 (BP Location: Left Arm, Patient Position: Sitting, Cuff Size: Normal)   Pulse (!) 58   Temp (!) 97.3 F (36.3 C) (Oral)   Resp 12   Ht 5\' 5"  (1.651 m)   Wt 168 lb 6.4 oz (76.4 kg)   SpO2 93%   BMI 28.02 kg/m   Body mass index is 28.02 kg/m.  Advanced Directives 03/16/2018 05/01/2017 04/10/2017 01/07/2017 08/20/2016 06/20/2016 02/20/2016  Does Patient Have a Medical Advance Directive? Yes Yes Yes Yes Yes Yes Yes  Type of Paramedic of [place];Living will [place];Living will [place];Living will [place];Living will [place];Living will Living will [place];Living will  Does patient want to make changes to medical advance directive? - No - Patient declined - - - - -  Copy of [place] in Chart? No - copy requested No - copy requested Yes No - copy requested - - No - copy requested    Tobacco Social History   Tobacco Use  Smoking Status Former Smoker  . Packs/day: 0.50  . Years: 18.00  . Pack years: 9.00  . Types: Cigarettes  . Start date: 06/23/1956  . Last attempt to quit: 11/22/1995  . Years since quitting: 22.3  Smokeless Tobacco Former Systems developer  . Quit date: 11/22/1996  Tobacco Comment   smoking cessation materials not required     Counseling given: No Comment: smoking cessation materials not required  Clinical Intake:  Pre-visit preparation completed: Yes  Pain : No/denies pain   BMI - recorded: 28.02 Nutritional Status: BMI 25 -29 Overweight Nutritional Risks: None  Nutrition Risk Assessment:  Has the patient had any  N/V/D within the last 2 months?  No  Does the patient have any non-healing wounds?  No  Has the patient had any unintentional weight loss or weight gain?  No   Diabetes:  Is the patient diabetic?  Yes  If diabetic, was a CBG obtained today?  No  Did the patient bring in their glucometer from home?  No  How often do you monitor your CBG's? Does not monitor CBG's.   Financial Strains and Diabetes Management:  Are you having any financial strains with the device, your supplies or your medication? No . Does not have a glucometer and does not feel she needs one at this time. Diet controlled  Does the patient want a Data processing manager Referral sent to the Care Guide for [employer] for their medication(s)?  No  Does the patient want to be seen by Chronic Care Management for management of their diabetes?  No  Would the patient like to be referred to a Nutritionist or for Diabetic Management?  No   Diabetic Exams:  Diabetic Eye Exam: Completed 11/22/16. Overdue for diabetic eye exam. Pt has been advised about the importance in completing this exam. States she would like to call and schedule her own appt. Declined my offer to send a referral to her provider to update her diabetic eye exam.  Diabetic Foot Exam: Completed 09/21/17.   How often do you need to have someone help you when you read instructions, pamphlets, or other written materials from your  doctor or pharmacy?: 1 - Never  Interpreter Needed?: No  Information entered by :: AEversole, LPN  Past Medical History:  Diagnosis Date  . Bladder disorder    a. urinary incontinence  . Cancer ([place])    cervical ca  . Coronary artery disease, non-occlusive    a. LHC 05/01/2017: LM no sig dz, LAD no sig dz, LCx no sig dz, RCA no sig dz, EF 55-65%, mod MR  . Endometriosis   . GERD (gastroesophageal reflux disease)   . Hyperlipidemia   . Hypertension   . Mitral regurgitation    a. TTE 05/01/2017: EF 55-60%, no RWMA, not technically  sufficient to allow for LV diastolic function, mild to moderate mitral regurgitation, mildly dilated left atrium, RV systolic function normal, PASP 35 mmHg, small to moderate pericardial effusion was noted  . Restless leg syndrome    Past Surgical History:  Procedure Laterality Date  . ABDOMINAL HYSTERECTOMY  1976  . LEFT HEART CATH AND CORONARY ANGIOGRAPHY N/A 05/01/2017   Procedure: LEFT HEART CATH AND CORONARY ANGIOGRAPHY;  Surgeon: Minna Merritts, MD;  Location: [facility];  Service: Cardiovascular;  Laterality: N/A;  . OOPHORECTOMY     Family History  Problem Relation Age of Onset  . Heart failure Father   . Arrhythmia Father   . Hypertension Father   . Arrhythmia Sister   . Hypertension Sister   . Congestive Heart Failure Brother   . Hypertension Brother   . Cardiomyopathy Sister   . Diabetes Brother   . Breast cancer Paternal Aunt 45   Social History   Socioeconomic History  . Marital status: Married    Spouse name: Doren Custard  . Number of children: 1  . Years of education: some college  . Highest education level: 12th grade  Occupational History  . Occupation: Retired  Scientific laboratory technician  . Financial resource strain: Not hard at all  . Food insecurity:    Worry: Never true    Inability: Never true  . Transportation needs:    Medical: No    Non-medical: No  Tobacco Use  . Smoking status: Former Smoker    Packs/day: 0.50    Years: 18.00    Pack years: 9.00    Types: Cigarettes    Start date: 06/23/1956    Last attempt to quit: 11/22/1995    Years since quitting: 22.3  . Smokeless tobacco: Former Systems developer    Quit date: 11/22/1996  . Tobacco comment: smoking cessation materials not required  Substance and Sexual Activity  . Alcohol use: Yes    Alcohol/week: 0.0 standard drinks    Comment: socially  . Drug use: No  . Sexual activity: Yes    Partners: Male  Lifestyle  . Physical activity:    Days per week: 7 days    Minutes per session: 10 min  . Stress: Not  at all  Relationships  . Social connections:    Talks on phone: Patient refused    Gets together: Patient refused    Attends religious service: Patient refused    Active member of club or organization: Patient refused    Attends meetings of clubs or organizations: Patient refused    Relationship status: Married  Other Topics Concern  . 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 acetaminophen (TYLENOL) 500 MG tablet Take 1 tablet (500 mg total) by mouth every 6 (six) hours as needed for headache.  Marland Kitchen aspirin 81 MG chewable tablet Chew 1 tablet by mouth daily.  Marland Kitchen atorvastatin (LIPITOR) 40 MG tablet Take 1 tablet (40 mg total) by mouth daily.  . carvedilol (COREG) 25 MG tablet Take 1 tablet (25 mg total) by mouth 2 (two) times daily with a meal.  . cloNIDine (CATAPRES) 0.1 MG tablet Take 1 tablet (0.1 mg total) by mouth every evening.  . ferrous sulfate 325 (65 FE) MG tablet Take 325 mg by mouth daily with breakfast.  . fluticasone (FLONASE) 50 MCG/ACT nasal spray USE 1 SPRAY IN EACH NOSTRIL TWO TIMES DAILY  . hydrALAZINE (APRESOLINE) 50 MG tablet Take 1 tablet (50 mg total) by mouth 3 (three) times daily.  . isosorbide mononitrate (IMDUR) 30 MG 24 hr tablet Take 1 tablet (30 mg total) by mouth daily.  . Magnesium Oxide 400 (240 MG) MG TABS Take 1 tablet by mouth daily.  . Multiple Vitamin (MULTIVITAMIN) capsule Take 1 capsule by mouth daily.  Marland Kitchen olmesartan (BENICAR) 40 MG tablet Take 1 tablet (40 mg total) by mouth daily.  Marland Kitchen oxybutynin (DITROPAN) 5 MG tablet Take 2 tablets (10 mg total) by mouth 2 (two) times daily.  Marland Kitchen rOPINIRole (REQUIP) 2 MG tablet Take 1 tablet (2 mg total) by mouth at bedtime.  . valACYclovir (VALTREX) 500 MG tablet TAKE 1 TABLET BY MOUTH 2   TIMES DAILY. PER EPISODE AS NEEDED, TAKE FOR 10 DAYS (Patient taking differently: TAKE 1 TABLET BY MOUTH 2  TIMES DAILY AS NEEDED. PER EPISODE AS NEEDED, TAKE FOR 10 DAYS)  . isosorbide mononitrate (IMDUR) 30 MG 24 hr tablet Take 1 tablet (30 mg total) by mouth daily.  . solifenacin (VESICARE) 5 MG tablet Take 1 tablet (5 mg total) by mouth daily.   No facility-administered encounter medications on file as of 03/16/2018.     Activities of Daily Living In your present state of health, do you have any difficulty performing the following activities: 03/16/2018 10/21/2017  Hearing? N N  Comment denies hearing aids -  Vision? N N  Comment wears eyeglasses -  Difficulty concentrating or making decisions? N N  Walking or climbing stairs? N N  Dressing or bathing? N N  Doing errands, shopping? N N  Preparing Food and eating ? N -  Comment denies dentures -  Using the Toilet? N -  In the past six months, have you accidently leaked urine? Y -  Comment stress incontinence -  Do you have problems with loss of bowel control? N -  Managing your Medications? N -  Managing your Finances? N -  Housekeeping or managing your Housekeeping? N -  Some recent data might be hidden    Patient Care Team: Steele Sizer, MD as PCP - General (Family Medicine) Thornton Park, MD as Consulting Physician (Orthopedic Surgery) Minna Merritts, MD as Consulting Physician (Cardiology) Rutherford Limerick, MD as Consulting Physician (Orthopedic Surgery)    Assessment:   This is a routine wellness examination for [facility].  Exercise Activities and Dietary recommendations Current Exercise Habits: Home exercise routine, Time (Minutes): 10, Frequency (Times/Week): 7, Weekly Exercise (Minutes/Week): 70, Intensity: Mild, Exercise limited by: None identified  Goals    . DIET - REDUCE SUGAR INTAKE     Recommend to eliminate sugar intake and to eat 3 small healthy meals and  at least 2 healthy snacks per day.        Fall Risk Fall Risk  03/16/2018 01/20/2018 10/21/2017 09/21/2017 08/21/2017  Falls in the past year? Yes No No No No  Number falls in past yr: 1 - - - -  Injury with Fall? No - - - -  Risk for fall due to : Impaired vision - - - -  Risk for fall due to: Comment wears eyeglasses - - - -  Follow up Falls evaluation completed;Education provided;Falls prevention discussed - - - -   FALL RISK PREVENTION PERTAINING TO THE HOME:  Any stairs in or around the home WITH handrails? Yes  Home free of loose throw rugs in walkways, pet beds, electrical cords, etc? Yes  Adequate lighting in your home to reduce risk of falls? Yes   ASSISTIVE DEVICES UTILIZED TO PREVENT FALLS:  Life alert? No  Use of a cane, walker or w/c? No  Grab bars in the bathroom? Yes  Shower chair or bench in shower? No  Elevated toilet seat or a handicapped toilet? Yes   DME ORDER AND COMMUNITY RESOURCE REFERRAL:  Does the patient want an order for a shower chair or an elevated toilet seat?  No  Does the patient want a Data processing manager Referral sent to the Care Guide for a life alert or installation of grab bars in the shower?  No   TIMED UP AND GO:  Was the test performed? Yes .  Length of time to ambulate 10 feet: 10 sec.   GAIT:  Describe appearance of gait: Gait stead-fast and without the use of an assistive device. Education: Fall risk prevention has been discussed.  Are interventions required? No   Depression Screen PHQ 2/9 Scores 03/16/2018 01/20/2018 08/21/2017 02/25/2017  PHQ - 2 Score 0 0 0 0  PHQ- 9 Score 0 0 - -     Cognitive Function     6CIT Screen 03/16/2018  What Year? 0 points  What month? 0 points  What time? 0 points  Count back from 20 0 points  Months in reverse 0 points  Repeat phrase 4 points  Total Score 4    Immunization History  Administered Date(s) Administered  . Influenza Split 04/27/2007, 04/19/2009  . Influenza, High Dose Seasonal PF 06/22/2015, 06/06/2016, 02/25/2017  .  Influenza,inj,Quad PF,6+ Mos 03/15/2013, 06/19/2014  . Pneumococcal Conjugate-13 12/21/2014  . Pneumococcal Polysaccharide-23 04/27/2007, 09/14/2012  . Td 11/02/2007  . Tdap 01/20/2018  . Zoster 07/12/2013    Qualifies for Shingles Vaccine? Yes  Zostavax completed 07/12/13. Due for Shingrix. Education has been provided regarding the importance of this vaccine. Pt has been advised to call insurance company to determine out of pocket expense. Advised may also receive vaccine at local pharmacy or Health Dept. Verbalized acceptance and understanding.  Flu Vaccine: Due for Flu vaccine. Does the patient want to receive this vaccine today?  No. States she would rather wait until 04/21/18 to receive this vaccine. Education has been provided regarding the importance of this vaccine but still declined.   Screening Tests Health Maintenance  Topic Date Due  . INFLUENZA VACCINE  04/21/2018 (Originally 01/21/2018)  . OPHTHALMOLOGY EXAM  05/16/2018 (Originally 11/22/2017)  . MAMMOGRAM  06/10/2018  . HEMOGLOBIN A1C  07/23/2018  . FOOT EXAM  09/22/2018  . COLONOSCOPY  02/11/2023  . TETANUS/TDAP  01/21/2028  . DEXA SCAN  Completed  . Hepatitis C Screening  Completed  . PNA vac Low Risk Adult  Completed  Cancer Screenings:  Colorectal Screening: Completed 02/10/13. Repeat every 10 years  Mammogram: Completed 06/10/17. Repeat every year. Ordered today. Pt provided with contact info and advised to call to schedule appt.   Bone Density: Completed 09/26/14. Results reflect OSTEOPENIA. Repeat every 2 years. Ordered today. Pt provided with contact info and advised to call to schedule appt.   Lung Cancer Screening: (Low Dose CT Chest recommended if Age 55-80 years, 30 pack-year currently smoking OR have quit w/in 15years.) does not qualify.   Additional Screening:  Hepatitis C Screening: Completed 09/14/12  Dental Screening: Recommended annual dental exams for proper oral hygiene    Plan:  I have personally  reviewed and addressed the Medicare Annual Wellness questionnaire and have noted the following in the patient's chart:  A. Medical and social history B. Use of alcohol, tobacco or illicit drugs  C. Current medications and supplements D. Functional ability and status E.  Nutritional status F.  Physical activity G. Advance directives H. List of other physicians I.  Hospitalizations, surgeries, and ER visits in previous 12 months J.  [place] such as hearing and vision if needed, cognitive and depression L. Referrals and appointments  In addition, I have reviewed and discussed with patient certain preventive protocols, quality metrics, and best practice recommendations. A written personalized care plan for preventive services as well as general preventive health recommendations were provided to patient.  See attached scanned questionnaire for additional information.   Signed,  Aleatha Borer, LPN Nurse Health Advisor

## 2018-03-16 NOTE — Telephone Encounter (Signed)
Refill request was sent to Dr. Krichna Sowles for approval and submission.  

## 2018-03-16 NOTE — Telephone Encounter (Signed)
-----   Message from Aleatha Borer, LPN sent at 9/82/4299 10:25 AM EDT ----- Pt seen today for AWV. Requesting a refill of her clonidine be sent to [facility].

## 2018-03-16 NOTE — Patient Instructions (Signed)
Alyssa Lawson , Thank you for taking time to come for your Medicare Wellness Visit. I appreciate your ongoing commitment to your health goals. Please review the following plan we discussed and let me know if I can assist you in the future.   Screening recommendations/referrals: Colorectal Screening: Up to date Mammogram: Please call to schedule your appointment AFTER 06/10/18 Bone Density: Ordered today. Please call to schedule your appointment  Vision and Dental Exams: Recommended annual ophthalmology exams for early detection of glaucoma and other disorders of the eye Recommended annual dental exams for proper oral hygiene  Diabetic Exams: Diabetic Eye Exam: Please call to schedule your appointment  Diabetic Foot Exam: Up to date  Vaccinations: Influenza vaccine: Declined Pneumococcal vaccine: Up to date Tdap vaccine: Up to date Shingles vaccine: Please call your insurance company to determine your out of pocket expense for the Shingrix vaccine. You may receive this vaccine at your local pharmacy.  Advanced directives: Please bring a copy of your POA (Power of Attorney) and/or Living Will to your next appointment.  Goals: Recommend to eliminate sugar intake and to eat 3 small healthy meals and at least 2 healthy snacks per day.  Next appointment: Please schedule your Annual Wellness Visit with your Nurse Health Advisor in one year.  Preventive Care 1 Years and Older, Female Preventive care refers to lifestyle choices and visits with your health care provider that can promote health and wellness. What does preventive care include?  A yearly physical exam. This is also called an annual well check.  Dental exams once or twice a year.  Routine eye exams. Ask your health care provider how often you should have your eyes checked.  Personal lifestyle choices, including:  Daily care of your teeth and gums.  Regular physical activity.  Eating a healthy diet.  Avoiding tobacco and  drug use.  Limiting alcohol use.  Practicing safe sex.  Taking low-dose aspirin every day if recommended by your health care provider.  Taking vitamin and mineral supplements as recommended by your health care provider. What happens during an annual well check? The services and screenings done by your health care provider during your annual well check will depend on your age, overall health, lifestyle risk factors, and family history of disease. Counseling  Your health care provider may ask you questions about your:  Alcohol use.  Tobacco use.  Drug use.  Emotional well-being.  Home and relationship well-being.  Sexual activity.  Eating habits.  History of falls.  Memory and ability to understand (cognition).  Work and work Statistician.  Reproductive health. Screening  You may have the following tests or measurements:  Height, weight, and BMI.  Blood pressure.  Lipid and cholesterol levels. These may be checked every 5 years, or more frequently if you are over 53 years old.  Skin check.  Lung cancer screening. You may have this screening every year starting at age 47 if you have a 30-pack-year history of smoking and currently smoke or have quit within the past 15 years.  Fecal occult blood test (FOBT) of the stool. You may have this test every year starting at age 32.  Flexible sigmoidoscopy or colonoscopy. You may have a sigmoidoscopy every 5 years or a colonoscopy every 10 years starting at age 31.  Hepatitis C blood test.  Hepatitis B blood test.  Sexually transmitted disease (STD) testing.  Diabetes screening. This is done by checking your blood sugar (glucose) after you have not eaten for a while (fasting).  You may have this done every 1-3 years.  Bone density scan. This is done to screen for osteoporosis. You may have this done starting at age 92.  Mammogram. This may be done every 1-2 years. Talk to your health care provider about how often you  should have regular mammograms. Talk with your health care provider about your test results, treatment options, and if necessary, the need for more tests. Vaccines  Your health care provider may recommend certain vaccines, such as:  Influenza vaccine. This is recommended every year.  Tetanus, diphtheria, and acellular pertussis (Tdap, Td) vaccine. You may need a Td booster every 10 years.  Zoster vaccine. You may need this after age 53.  Pneumococcal 13-valent conjugate (PCV13) vaccine. One dose is recommended after age 36.  Pneumococcal polysaccharide (PPSV23) vaccine. One dose is recommended after age 40. Talk to your health care provider about which screenings and vaccines you need and how often you need them. This information is not intended to replace advice given to you by your health care provider. Make sure you discuss any questions you have with your health care provider. Document Released: 07/06/2015 Document Revised: 02/27/2016 Document Reviewed: 04/10/2015 Elsevier Interactive Patient Education  2017 [place] Prevention in the Home Falls can cause injuries. They can happen to people of all ages. There are many things you can do to make your home safe and to help prevent falls. What can I do on the outside of my home?  Regularly fix the edges of walkways and driveways and fix any cracks.  Remove anything that might make you trip as you walk through a door, such as a raised step or threshold.  Trim any bushes or trees on the path to your home.  Use bright outdoor lighting.  Clear any walking paths of anything that might make someone trip, such as rocks or tools.  Regularly check to see if handrails are loose or broken. Make sure that both sides of any steps have handrails.  Any raised decks and porches should have guardrails on the edges.  Have any leaves, snow, or ice cleared regularly.  Use sand or salt on walking paths during winter.  Clean up any  spills in your garage right away. This includes oil or grease spills. What can I do in the bathroom?  Use night lights.  Install grab bars by the toilet and in the tub and shower. Do not use towel bars as grab bars.  Use non-skid mats or decals in the tub or shower.  If you need to sit down in the shower, use a plastic, non-slip stool.  Keep the floor dry. Clean up any water that spills on the floor as soon as it happens.  Remove soap buildup in the tub or shower regularly.  Attach bath mats securely with double-sided non-slip rug tape.  Do not have throw rugs and other things on the floor that can make you trip. What can I do in the bedroom?  Use night lights.  Make sure that you have a light by your bed that is easy to reach.  Do not use any sheets or blankets that are too big for your bed. They should not hang down onto the floor.  Have a firm chair that has side arms. You can use this for support while you get dressed.  Do not have throw rugs and other things on the floor that can make you trip. What can I do in the kitchen?  Clean up any spills right away.  Avoid walking on wet floors.  Keep items that you use a lot in easy-to-reach places.  If you need to reach something above you, use a strong step stool that has a grab bar.  Keep electrical cords out of the way.  Do not use floor polish or wax that makes floors slippery. If you must use wax, use non-skid floor wax.  Do not have throw rugs and other things on the floor that can make you trip. What can I do with my stairs?  Do not leave any items on the stairs.  Make sure that there are handrails on both sides of the stairs and use them. Fix handrails that are broken or loose. Make sure that handrails are as long as the stairways.  Check any carpeting to make sure that it is firmly attached to the stairs. Fix any carpet that is loose or worn.  Avoid having throw rugs at the top or bottom of the stairs. If you  do have throw rugs, attach them to the floor with carpet tape.  Make sure that you have a light switch at the top of the stairs and the bottom of the stairs. If you do not have them, ask someone to add them for you. What else can I do to help prevent falls?  Wear shoes that:  Do not have high heels.  Have rubber bottoms.  Are comfortable and fit you well.  Are closed at the toe. Do not wear sandals.  If you use a stepladder:  Make sure that it is fully opened. Do not climb a closed stepladder.  Make sure that both sides of the stepladder are locked into place.  Ask someone to hold it for you, if possible.  Clearly mark and make sure that you can see:  Any grab bars or handrails.  First and last steps.  Where the edge of each step is.  Use tools that help you move around (mobility aids) if they are needed. These include:  Canes.  Walkers.  Scooters.  Crutches.  Turn on the lights when you go into a dark area. Replace any light bulbs as soon as they burn out.  Set up your furniture so you have a clear path. Avoid moving your furniture around.  If any of your floors are uneven, fix them.  If there are any pets around you, be aware of where they are.  Review your medicines with your doctor. Some medicines can make you feel dizzy. This can increase your chance of falling. Ask your doctor what other things that you can do to help prevent falls. This information is not intended to replace advice given to you by your health care provider. Make sure you discuss any questions you have with your health care provider. Document Released: 04/05/2009 Document Revised: 11/15/2015 Document Reviewed: 07/14/2014 Elsevier Interactive Patient Education  2017 [employer].

## 2018-04-06 DIAGNOSIS — M152 Bouchard's nodes (with arthropathy): Secondary | ICD-10-CM | POA: Diagnosis not present

## 2018-04-21 ENCOUNTER — Encounter: Payer: Self-pay | Admitting: Family Medicine

## 2018-04-21 ENCOUNTER — Ambulatory Visit (INDEPENDENT_AMBULATORY_CARE_PROVIDER_SITE_OTHER): Payer: PRIVATE HEALTH INSURANCE | Admitting: Family Medicine

## 2018-04-21 VITALS — BP 138/78 | HR 65 | Temp 98.0°F | Resp 16 | Ht 65.0 in | Wt 167.8 lb

## 2018-04-21 DIAGNOSIS — G2581 Restless legs syndrome: Secondary | ICD-10-CM

## 2018-04-21 DIAGNOSIS — Z23 Encounter for immunization: Secondary | ICD-10-CM

## 2018-04-21 DIAGNOSIS — I200 Unstable angina: Secondary | ICD-10-CM

## 2018-04-21 DIAGNOSIS — N3941 Urge incontinence: Secondary | ICD-10-CM | POA: Diagnosis not present

## 2018-04-21 DIAGNOSIS — M545 Low back pain, unspecified: Secondary | ICD-10-CM

## 2018-04-21 DIAGNOSIS — I10 Essential (primary) hypertension: Secondary | ICD-10-CM | POA: Diagnosis not present

## 2018-04-21 DIAGNOSIS — E8881 Metabolic syndrome: Secondary | ICD-10-CM | POA: Diagnosis not present

## 2018-04-21 DIAGNOSIS — E785 Hyperlipidemia, unspecified: Secondary | ICD-10-CM | POA: Diagnosis not present

## 2018-04-21 DIAGNOSIS — G8929 Other chronic pain: Secondary | ICD-10-CM | POA: Diagnosis not present

## 2018-04-21 MED ORDER — METHOCARBAMOL 500 MG PO TABS: 500.0000 mg | ORAL_TABLET | Freq: Every evening | ORAL | 1 refills | Status: DC | PRN

## 2018-04-21 NOTE — Progress Notes (Signed)
Name: Alyssa Lawson   MRN: [id number]    DOB: December 17, 1945   Date:04/21/2018       Progress Note  Subjective  Chief Complaint  Chief Complaint  Patient presents with  . Medication Refill  . Hypertension    Denies any symptoms-BP is running about 108/84 at home  . Gastroesophageal Reflux    Having some belching   . Hyperlipidemia    Muscle cramps but improving over time    HPI  HTN: taking coreg 25mg  daily, Benicar 40 , hydralazine 50mg  TID and clonidine qhs. BP was elevated this Summer but has been at goal with medication adjustment. She denies recent episodes of chest pain or palpitation. Denies orthostatic changes.   Hyperlipidemia: last labs done  April 2019 and it was at goal, LDL 76  , taking Atorvastatin at night.She occasionally has muscle pain, but better with magnesium   Metabolic Syndrome:  BSJG2E back in August as 6.2% she denies polyphagia or polydipsia.  She stopped taking metformin due to pt c/o swelling and weight gain with medication. Pt was tarted Trulicity back in 36/6294 but stopped because of cost. She is now only on life style mofidication   Urinary incontinence: she has been on Ditropan for many years, but symptoms got worse, so we switched to [facility] however she states too expensive , she was advised to go up on ditropan dose but is still only taking 5 mg bid , has nocturia times one  RLS: better with medication, sleeping well with medication,- Requip - unchanged   Chronic Back Pain: she has daily low back pain, She takes Tylenol prn, mobic and also methocarbamol, She left her job back in May 2018 - she would like a refill of methocarbamol   Unstable angina: indigestion symptoms with abnormal EKG, normal cardiac cath done Nov 2018 by Dr. [name], on higher dose of Coreg, and Benicar also on Imdur and indigestion improved. Also on statin therapy and aspirin  No recent episodes    Patient Active Problem List   Diagnosis Date Noted  . Prediabetes 01/21/2018   . Atrial tachycardia ([place]) 08/25/2017  . Unstable angina ([place]) 05/01/2017  . Positive cardiac stress test 05/01/2017  . Chest pain 04/24/2017  . Abnormal EKG 04/24/2017  . Smoking history 04/24/2017  . Rotator cuff tendinitis, left 04/10/2017  . Cataracts, bilateral 11/25/2016  . Impingement syndrome of shoulder, left 10/13/2016  . Chronic bilateral low back pain without sciatica 08/20/2016  . Metabolic syndrome 76/54/6503  . Mitral regurgitation 06/22/2015  . Melasma 12/21/2014  . History of hysterectomy 12/21/2014  . Restless leg syndrome 12/21/2014  . Benign essential HTN 12/17/2014  . Cataract 12/17/2014  . Dyslipidemia 12/17/2014  . Gastric reflux 12/17/2014  . History of cervical cancer 12/17/2014  . H/O iron deficiency anemia 12/17/2014  . Urinary incontinence in female 12/17/2014  . Dysmetabolic syndrome 54/65/6812  . TI (tricuspid incompetence) 12/17/2014  . Osteopenia of the elderly 12/17/2014    Past Surgical History:  Procedure Laterality Date  . ABDOMINAL HYSTERECTOMY  1976  . LEFT HEART CATH AND CORONARY ANGIOGRAPHY N/A 05/01/2017   Procedure: LEFT HEART CATH AND CORONARY ANGIOGRAPHY;  Surgeon: Minna Merritts, MD;  Location: [place] [facility];  Service: Cardiovascular;  Laterality: N/A;  . OOPHORECTOMY      Family History  Problem Relation Age of Onset  . Heart failure Father   . Arrhythmia Father   . Hypertension Father   . Arrhythmia Sister   . Hypertension Sister   .  Congestive Heart Failure Brother   . Hypertension Brother   . Cardiomyopathy Sister   . Diabetes Brother   . Breast cancer Paternal Aunt 65    Social History   Socioeconomic History  . Marital status: Married    Spouse name: Doren Custard  . Number of children: 1  . Years of education: some college  . Highest education level: 12th grade  Occupational History  . Occupation: Retired  Scientific laboratory technician  . Financial resource strain: Not hard at all  . Food insecurity:    Worry: Never  true    Inability: Never true  . Transportation needs:    Medical: No    Non-medical: No  Tobacco Use  . Smoking status: Former Smoker    Packs/day: 0.50    Years: 18.00    Pack years: 9.00    Types: Cigarettes    Start date: 06/23/1956    Last attempt to quit: 11/22/1995    Years since quitting: 22.4  . Smokeless tobacco: Former Systems developer    Quit date: 11/22/1996  . Tobacco comment: smoking cessation materials not required  Substance and Sexual Activity  . Alcohol use: Yes    Alcohol/week: 0.0 standard drinks    Comment: socially  . Drug use: No  . Sexual activity: Yes    Partners: Male  Lifestyle  . Physical activity:    Days per week: 7 days    Minutes per session: 10 min  . Stress: Not at all  Relationships  . Social connections:    Talks on phone: Patient refused    Gets together: Patient refused    Attends religious service: Patient refused    Active member of club or organization: Patient refused    Attends meetings of clubs or organizations: Patient refused    Relationship status: Married  . Intimate partner violence:    Fear of current or ex partner: No    Emotionally abused: No    Physically abused: No    Forced sexual activity: No  Other Topics Concern  . 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  acetaminophen (TYLENOL) 500 MG tablet, Take 1 tablet (500 mg total) by mouth every 6 (six) hours as needed for headache., Disp: 30 tablet, Rfl: 0 .  aspirin 81 MG chewable tablet, Chew 1 tablet by mouth daily., Disp: , Rfl:  .  atorvastatin (LIPITOR) 40 MG tablet, Take 1 tablet (40 mg total) by mouth daily., Disp: 90 tablet, Rfl: 1 .  carvedilol (COREG) 25 MG tablet, Take 1 tablet (25 mg total) by mouth 2 (two) times daily with a meal., Disp: 180 tablet, Rfl: 1 .  cloNIDine  (CATAPRES) 0.1 MG tablet, Take 1 tablet (0.1 mg total) by mouth every evening., Disp: 90 tablet, Rfl: 1 .  ferrous sulfate 325 (65 FE) MG tablet, Take 325 mg by mouth daily with breakfast., Disp: , Rfl:  .  fluticasone (FLONASE) 50 MCG/ACT nasal spray, USE 1 SPRAY IN EACH NOSTRIL TWO TIMES DAILY, Disp: 48 g, Rfl: 1 .  hydrALAZINE (APRESOLINE) 50 MG tablet, Take 1 tablet (50 mg total) by mouth 3 (three) times daily., Disp: 270 tablet, Rfl: 3 .  isosorbide mononitrate (IMDUR) 30 MG 24 hr tablet, Take 1 tablet (30 mg total) by mouth daily., Disp: 90  tablet, Rfl: 3 .  Magnesium Oxide 400 (240 MG) MG TABS, Take 1 tablet by mouth daily., Disp: , Rfl:  .  Multiple Vitamin (MULTIVITAMIN) capsule, Take 1 capsule by mouth daily., Disp: , Rfl:  .  olmesartan (BENICAR) 40 MG tablet, Take 1 tablet (40 mg total) by mouth daily., Disp: 90 tablet, Rfl: 1 .  oxybutynin (DITROPAN) 5 MG tablet, Take 2 tablets (10 mg total) by mouth 2 (two) times daily., Disp: 360 tablet, Rfl: 0 .  rOPINIRole (REQUIP) 2 MG tablet, Take 1 tablet (2 mg total) by mouth at bedtime., Disp: 90 tablet, Rfl: 1 .  valACYclovir (VALTREX) 500 MG tablet, TAKE 1 TABLET BY MOUTH 2  TIMES DAILY. PER EPISODE AS NEEDED, TAKE FOR 10 DAYS (Patient taking differently: TAKE 1 TABLET BY MOUTH 2  TIMES DAILY AS NEEDED. PER EPISODE AS NEEDED, TAKE FOR 10 DAYS), Disp: 90 tablet, Rfl: 0 .  diclofenac sodium (VOLTAREN) 1 % GEL, diclofenac 1 % topical gel, Disp: , Rfl:  .  solifenacin (VESICARE) 5 MG tablet, Take 1 tablet (5 mg total) by mouth daily. (Patient not taking: Reported on 04/21/2018), Disp: 90 tablet, Rfl: 1  Allergies  Allergen Reactions  . Amlodipine Swelling    I personally reviewed active problem list, medication list, allergies, family history, social history with the patient/caregiver today.   ROS  Constitutional: Negative for fever or weight change.  Respiratory: Negative for cough and shortness of breath.   Cardiovascular: positive   for chest pain intermittently but no palpitations.  Gastrointestinal: Negative for abdominal pain, no bowel changes.  Musculoskeletal: Negative for gait problem or joint swelling.  Skin: Negative for rash.  Neurological: Negative for dizziness or headache.  No other specific complaints in a complete review of systems (except as listed in HPI above).  Objective  Vitals:   04/21/18 1152  BP: 138/78  Pulse: 65  Resp: 16  Temp: 98 F (36.7 C)  TempSrc: Oral  SpO2: 97%  Weight: 167 lb 12.8 oz (76.1 kg)  Height: 5\' 5"  (1.651 m)    Body mass index is 27.92 kg/m.  Physical Exam  Constitutional: Patient appears well-developed and well-nourished. Overweight.  No distress.  HEENT: head atraumatic, normocephalic, pupils equal and reactive to light, neck supple, throat within normal limits Cardiovascular: Normal rate, regular rhythm and normal heart sounds.  No murmur heard. No BLE edema. Pulmonary/Chest: Effort normal and breath sounds normal. No respiratory distress. Abdominal: Soft.  There is no tenderness. Psychiatric: Patient has a normal mood and affect. behavior is normal. Judgment and thought content normal.  PHQ2/9: Depression screen [facility] 2/9 04/21/2018 03/16/2018 01/20/2018 08/21/2017 02/25/2017  Decreased Interest 0 0 0 0 0  Down, Depressed, Hopeless 0 0 0 0 0  PHQ - 2 Score 0 0 0 0 0  Altered sleeping 0 0 0 - -  Tired, decreased energy 0 0 0 - -  Change in appetite 0 0 0 - -  Feeling bad or failure about yourself  0 0 0 - -  Trouble concentrating 0 0 0 - -  Moving slowly or fidgety/restless 0 0 0 - -  Suicidal thoughts 0 0 0 - -  PHQ-9 Score 0 0 0 - -  Difficult doing work/chores Not difficult at all Not difficult at all Not difficult at all - -    Fall Risk: Fall Risk  04/21/2018 03/16/2018 01/20/2018 10/21/2017 09/21/2017  Falls in the past year? Yes Yes No No No  Number falls in past yr:  1 1 - - -  Injury with Fall? Yes No - - -  Comment right knee - - - -  Risk for fall  due to : - Impaired vision - - -  Risk for fall due to: Comment - wears eyeglasses - - -  Follow up - Falls evaluation completed;Education provided;Falls prevention discussed - - -     Functional Status Survey: Is the patient deaf or have difficulty hearing?: No Does the patient have difficulty seeing, even when wearing glasses/contacts?: Yes(glasses) Does the patient have difficulty concentrating, remembering, or making decisions?: No Does the patient have difficulty walking or climbing stairs?: No Does the patient have difficulty dressing or bathing?: No Does the patient have difficulty doing errands alone such as visiting a doctor's office or shopping?: No   Assessment & Plan  1. Unstable angina ([place])   2. RLS (restless legs syndrome)   3. Urge incontinence  Stay on 5 mg bid  4. Dyslipidemia   5. Benign essential HTN  Finally at goal, continue medication   6. Restless leg syndrome  stable  7. Metabolic syndrome  Continue life style modfication  8. Chronic bilateral low back pain without sciatica  - methocarbamol (ROBAXIN) 500 MG tablet; Take 1 tablet (500 mg total) by mouth at bedtime as needed for muscle spasms.  Dispense: 90 tablet; Refill: 1  9. Needs flu shot  - Flu vaccine HIGH DOSE PF

## 2018-06-04 ENCOUNTER — Encounter: Payer: Self-pay | Admitting: Family Medicine

## 2018-06-05 ENCOUNTER — Other Ambulatory Visit: Payer: Self-pay

## 2018-06-05 ENCOUNTER — Ambulatory Visit
Admission: EM | Admit: 2018-06-05 | Discharge: 2018-06-05 | Disposition: A | Discharge status: Home / Self Care | Payer: Medicare HMO | Attending: Emergency Medicine | Admitting: Emergency Medicine

## 2018-06-05 DIAGNOSIS — H6983 Other specified disorders of Eustachian tube, bilateral: Secondary | ICD-10-CM | POA: Insufficient documentation

## 2018-06-05 MED ORDER — CETIRIZINE-PSEUDOEPHEDRINE ER 5-120 MG PO TB12: 1.0000 | ORAL_TABLET | Freq: Every day | ORAL | 0 refills | Status: DC

## 2018-06-05 NOTE — ED Triage Notes (Signed)
Right sided ear pain and headache. Sx started on Thursday.

## 2018-06-05 NOTE — ED Provider Notes (Addendum)
[facility]    CSN: [id number] Arrival date & time: 06/05/18  1136     History   Chief Complaint Chief Complaint  Patient presents with  . Otalgia  . Headache    HPI Alyssa Lawson is a 72 y.o. female.   HPI  72 year old female presents with the sudden onset of right-sided ear pain and headache that started on Thursday.  Patient she was at dinner and it started to bother her and became "full fledged" she arrived home.  Since that time she has had a feeling of fullness in her right ear.  She has also had some decreased hearing as well.  She does not endorse any dizziness.  She has had no altitude encounters.  Does have a history of seasonal allergies and is taking Flonase on a daily basis.  History of hypertension which is mildly elevated today but she states that she forgot to take all of her medications this morning.  No neurological symptoms.  Denies any visual disturbances numbness or tingling or weakness.  Is any sinus drainage or pain and has not had any coughing.  She does not feel badly.          Past Medical History:  Diagnosis Date  . Bladder disorder    a. urinary incontinence  . Cancer ([place])    cervical ca  . Coronary artery disease, non-occlusive    a. LHC 05/01/2017: LM no sig dz, LAD no sig dz, LCx no sig dz, RCA no sig dz, EF 55-65%, mod MR  . Endometriosis   . GERD (gastroesophageal reflux disease)   . Hyperlipidemia   . Hypertension   . Mitral regurgitation    a. TTE 05/01/2017: EF 55-60%, no RWMA, not technically sufficient to allow for LV diastolic function, mild to moderate mitral regurgitation, mildly dilated left atrium, RV systolic function normal, PASP 35 mmHg, small to moderate pericardial effusion was noted  . Restless leg syndrome     Patient Active Problem List   Diagnosis Date Noted  . Prediabetes 01/21/2018  . Atrial tachycardia ([place]) 08/25/2017  . Unstable angina ([place]) 05/01/2017  . Positive cardiac stress test 05/01/2017    . Chest pain 04/24/2017  . Abnormal EKG 04/24/2017  . Smoking history 04/24/2017  . Rotator cuff tendinitis, left 04/10/2017  . Cataracts, bilateral 11/25/2016  . Impingement syndrome of shoulder, left 10/13/2016  . Chronic bilateral low back pain without sciatica 08/20/2016  . Metabolic syndrome 91/63/8466  . Mitral regurgitation 06/22/2015  . Melasma 12/21/2014  . History of hysterectomy 12/21/2014  . Restless leg syndrome 12/21/2014  . Benign essential HTN 12/17/2014  . Cataract 12/17/2014  . Dyslipidemia 12/17/2014  . Gastric reflux 12/17/2014  . History of cervical cancer 12/17/2014  . H/O iron deficiency anemia 12/17/2014  . Urinary incontinence in female 12/17/2014  . Dysmetabolic syndrome 59/93/5701  . TI (tricuspid incompetence) 12/17/2014  . Osteopenia of the elderly 12/17/2014    Past Surgical History:  Procedure Laterality Date  . ABDOMINAL HYSTERECTOMY  1976  . LEFT HEART CATH AND CORONARY ANGIOGRAPHY N/A 05/01/2017   Procedure: LEFT HEART CATH AND CORONARY ANGIOGRAPHY;  Surgeon: Minna Merritts, MD;  Location: [place] [facility];  Service: Cardiovascular;  Laterality: N/A;  . OOPHORECTOMY      OB History   No obstetric history on file.      Home Medications    Prior to Admission medications   Medication Sig Start Date End Date Taking? Authorizing Provider  acetaminophen (TYLENOL)  500 MG tablet Take 1 tablet (500 mg total) by mouth every 6 (six) hours as needed for headache. 11/20/14   Betancourt, Aura Fey, NP  aspirin 81 MG chewable tablet Chew 1 tablet by mouth daily. 01/23/11   [provider]  atorvastatin (LIPITOR) 40 MG tablet Take 1 tablet (40 mg total) by mouth daily. 10/21/17   Steele Sizer, MD  carvedilol (COREG) 25 MG tablet Take 1 tablet (25 mg total) by mouth 2 (two) times daily with a meal. 10/21/17   Sowles, Drue Stager, MD  cetirizine-pseudoephedrine (ZYRTEC-D) 5-120 MG tablet Take 1 tablet by mouth daily. 06/05/18   Lorin Picket,  PA-C  cloNIDine (CATAPRES) 0.1 MG tablet Take 1 tablet (0.1 mg total) by mouth every evening. 03/16/18   Steele Sizer, MD  diclofenac sodium (VOLTAREN) 1 % GEL diclofenac 1 % topical gel    [provider]  ferrous sulfate 325 (65 FE) MG tablet Take 325 mg by mouth daily with breakfast.    [provider]  fluticasone (FLONASE) 50 MCG/ACT nasal spray USE 1 SPRAY IN EACH NOSTRIL TWO TIMES DAILY 06/24/17   Steele Sizer, MD  hydrALAZINE (APRESOLINE) 50 MG tablet Take 1 tablet (50 mg total) by mouth 3 (three) times daily. 08/10/17 04/21/18  Minna Merritts, MD  isosorbide mononitrate (IMDUR) 30 MG 24 hr tablet Take 1 tablet (30 mg total) by mouth daily. 02/25/18 05/26/18  Minna Merritts, MD  Magnesium Oxide 400 (240 MG) MG TABS Take 1 tablet by mouth daily. 03/15/13   [provider]  methocarbamol (ROBAXIN) 500 MG tablet Take 1 tablet (500 mg total) by mouth at bedtime as needed for muscle spasms. 04/21/18   Steele Sizer, MD  Multiple Vitamin (MULTIVITAMIN) capsule Take 1 capsule by mouth daily.    [provider]  olmesartan (BENICAR) 40 MG tablet Take 1 tablet (40 mg total) by mouth daily. 10/21/17   Steele Sizer, MD  oxybutynin (DITROPAN) 5 MG tablet Take 2 tablets (10 mg total) by mouth 2 (two) times daily. 01/13/18   Steele Sizer, MD  rOPINIRole (REQUIP) 2 MG tablet Take 1 tablet (2 mg total) by mouth at bedtime. 10/21/17   Steele Sizer, MD  valACYclovir (VALTREX) 500 MG tablet TAKE 1 TABLET BY MOUTH 2  TIMES DAILY. PER EPISODE AS NEEDED, TAKE FOR 10 DAYS Patient taking differently: TAKE 1 TABLET BY MOUTH 2  TIMES DAILY AS NEEDED. PER EPISODE AS NEEDED, TAKE FOR 10 DAYS 05/25/16   Steele Sizer, MD    Family History Family History  Problem Relation Age of Onset  . Heart failure Father   . Arrhythmia Father   . Hypertension Father   . Arrhythmia Sister   . Hypertension Sister   . Congestive Heart Failure Brother   . Hypertension Brother   .  Cardiomyopathy Sister   . Diabetes Brother   . Breast cancer Paternal Aunt 20    Social History Social History   Tobacco Use  . Smoking status: Former Smoker    Packs/day: 0.50    Years: 18.00    Pack years: 9.00    Types: Cigarettes    Start date: 06/23/1956    Last attempt to quit: 11/22/1995    Years since quitting: 22.5  . Smokeless tobacco: Former Systems developer    Quit date: 11/22/1996  . Tobacco comment: smoking cessation materials not required  Substance Use Topics  . Alcohol use: Yes    Alcohol/week: 0.0 standard drinks    Comment: socially  .  Drug use: No     Allergies   Amlodipine   Review of Systems Review of Systems  Constitutional: Positive for activity change. Negative for appetite change, chills, diaphoresis, fatigue and fever.  HENT: Positive for ear pain and tinnitus. Negative for ear discharge.   All other systems reviewed and are negative.    Physical Exam Triage Vital Signs ED Triage Vitals  Enc Vitals Group     BP 06/05/18 1214 (!) 141/79     Pulse Rate 06/05/18 1214 (!) 57     Resp 06/05/18 1214 18     Temp 06/05/18 1214 98.1 F (36.7 C)     Temp Source 06/05/18 1214 Oral     SpO2 06/05/18 1214 97 %     Weight 06/05/18 1213 165 lb (74.8 kg)     Height 06/05/18 1213 5\' 5"  (1.651 m)     Head Circumference --      Peak Flow --      Pain Score 06/05/18 1213 6     Pain Loc --      Pain Edu? --      Excl. in [place]? --    No data found.  Updated Vital Signs BP (!) 141/79 (BP Location: Left Arm)   Pulse (!) 57   Temp 98.1 F (36.7 C) (Oral)   Resp 18   Ht 5\' 5"  (1.651 m)   Wt 165 lb (74.8 kg)   SpO2 97%   BMI 27.46 kg/m   Visual Acuity Right Eye Distance:   Left Eye Distance:   Bilateral Distance:    Right Eye Near:   Left Eye Near:    Bilateral Near:     Physical Exam Vitals signs and nursing note reviewed.  Constitutional:      General: She is not in acute distress.    Appearance: She is well-developed and normal weight. She is not  ill-appearing, toxic-appearing or diaphoretic.  HENT:     Head: Normocephalic.     Comments: Emanation of the ears shows the canals to be normal bilaterally.  TMs have a normal light reflex with no bulging or retraction seen.  There is no erythema.  No drainage is seen.    Mouth/Throat:     Mouth: Mucous membranes are moist.  Eyes:     General: No scleral icterus.    Extraocular Movements: Extraocular movements intact.  Neck:     Musculoskeletal: Normal range of motion.  Skin:    General: Skin is warm and dry.  Neurological:     Mental Status: She is alert and oriented to person, place, and time.     Cranial Nerves: No dysarthria or facial asymmetry.  Psychiatric:        Mood and Affect: Mood normal.        Speech: Speech normal.        Behavior: Behavior normal.      UC Treatments / Results  Labs (all labs ordered are listed, but only abnormal results are displayed) Labs Reviewed - No data to display  EKG None  Radiology No results found.  Procedures Procedures (including critical care time)  Medications Ordered in UC Medications - No data to display  Initial Impression / Assessment and Plan / UC Course  I have reviewed the triage vital signs and the nursing notes.  Pertinent labs & imaging results that were available during my care of the patient were reviewed by me and considered in my medical decision making (see chart for details).  The patient that she likely has an eustachian tube dysfunction her blood pressure is mildly elevated but she forgot to take her medications today.  Except Flonase on a daily basis.  I will add Zyrtec-D up with the precaution that she must follow her blood pressure and pulse very closely and discontinue its use if they become elevated.  I have told her that if she is not improving she should follow-up with [facility].  If she worsens she may consider going to the emergency room.  Final Clinical Impressions(s) / UC  Diagnoses   Final diagnoses:  Eustachian tube dysfunction, bilateral     Discharge Instructions     Monitor your blood pressure and pulse very closely while taking the Zyrtec-D.  If they become elevated stopped taking the medication immediately.  Recommend following up with [facility] [facility].   ED Prescriptions    Medication Sig Dispense Auth. Provider   cetirizine-pseudoephedrine (ZYRTEC-D) 5-120 MG tablet Take 1 tablet by mouth daily. 14 tablet Lorin Picket, PA-C     Controlled Substance Prescriptions [place] Controlled Substance Registry consulted? Not Applicable   Lorin Picket, PA-C 06/05/18 1359    Lorin Picket, PA-C 06/05/18 1400

## 2018-06-05 NOTE — Discharge Instructions (Addendum)
Monitor your blood pressure and pulse very closely while taking the Zyrtec-D.  If they become elevated stopped taking the medication immediately.  Recommend following up with [facility].

## 2018-06-10 ENCOUNTER — Other Ambulatory Visit: Payer: Self-pay | Admitting: *Deleted

## 2018-06-10 ENCOUNTER — Ambulatory Visit: Payer: PRIVATE HEALTH INSURANCE

## 2018-06-10 ENCOUNTER — Inpatient Hospital Stay: Admission: RE | Admit: 2018-06-10 | Payer: PRIVATE HEALTH INSURANCE | Source: Ambulatory Visit

## 2018-06-10 MED ORDER — HYDRALAZINE HCL 50 MG PO TABS: 50.0000 mg | ORAL_TABLET | Freq: Three times a day (TID) | ORAL | 0 refills | Status: DC

## 2018-06-12 ENCOUNTER — Other Ambulatory Visit: Payer: Self-pay | Admitting: Family Medicine

## 2018-06-12 DIAGNOSIS — I10 Essential (primary) hypertension: Secondary | ICD-10-CM

## 2018-06-12 DIAGNOSIS — G2581 Restless legs syndrome: Secondary | ICD-10-CM

## 2018-06-30 ENCOUNTER — Ambulatory Visit: Payer: PRIVATE HEALTH INSURANCE

## 2018-06-30 ENCOUNTER — Inpatient Hospital Stay: Admission: RE | Admit: 2018-06-30 | Payer: PRIVATE HEALTH INSURANCE | Source: Ambulatory Visit

## 2018-07-09 ENCOUNTER — Other Ambulatory Visit: Payer: Self-pay

## 2018-07-09 DIAGNOSIS — E785 Hyperlipidemia, unspecified: Secondary | ICD-10-CM

## 2018-07-09 MED ORDER — ATORVASTATIN CALCIUM 40 MG PO TABS: 40.0000 mg | ORAL_TABLET | Freq: Every day | ORAL | 1 refills | Status: DC

## 2018-07-09 NOTE — Telephone Encounter (Signed)
Refill Request for Cholesterol medication. Atorvastatin 40 mg  Last physical: 03/16/2018  Lab Results  Component Value Date   CHOL 163 09/21/2017   HDL 69 09/21/2017   LDLCALC 76 09/21/2017   TRIG 101 09/21/2017   CHOLHDL 2.4 09/21/2017    Follow Up: 10/22/2018

## 2018-07-19 ENCOUNTER — Ambulatory Visit: Payer: PRIVATE HEALTH INSURANCE

## 2018-07-19 ENCOUNTER — Inpatient Hospital Stay: Admission: RE | Admit: 2018-07-19 | Payer: PRIVATE HEALTH INSURANCE | Source: Ambulatory Visit

## 2018-07-30 ENCOUNTER — Other Ambulatory Visit: Payer: Self-pay

## 2018-07-30 DIAGNOSIS — I10 Essential (primary) hypertension: Secondary | ICD-10-CM

## 2018-07-30 MED ORDER — OLMESARTAN MEDOXOMIL 40 MG PO TABS: 40.0000 mg | ORAL_TABLET | Freq: Every day | ORAL | 1 refills | Status: DC

## 2018-07-30 MED ORDER — CLONIDINE HCL 0.1 MG PO TABS: 0.1000 mg | ORAL_TABLET | Freq: Every evening | ORAL | 1 refills | Status: DC

## 2018-07-30 NOTE — Telephone Encounter (Signed)
Hypertension medication request: Olmesartan to [facility].   Last office visit pertaining to hypertension: 04/21/2018   BP Readings from Last 3 Encounters:  06/05/18 (!) 141/79  04/21/18 138/78  03/16/18 140/60    Lab Results  Component Value Date   CREATININE 1.01 (H) 01/20/2018   BUN 22 01/20/2018   NA 140 01/20/2018   K 4.3 01/20/2018   CL 105 01/20/2018   CO2 29 01/20/2018     Follow up on 10/22/2018

## 2018-08-03 ENCOUNTER — Encounter: Payer: Self-pay | Admitting: Family Medicine

## 2018-08-04 ENCOUNTER — Ambulatory Visit
Admission: RE | Admit: 2018-08-04 | Discharge: 2018-08-04 | Disposition: A | Discharge status: Home / Self Care | Payer: Medicare Other | Source: Ambulatory Visit | Attending: Family Medicine | Admitting: Family Medicine

## 2018-08-04 ENCOUNTER — Other Ambulatory Visit: Payer: Self-pay | Admitting: Family Medicine

## 2018-08-04 ENCOUNTER — Other Ambulatory Visit: Payer: Self-pay

## 2018-08-04 DIAGNOSIS — Z1239 Encounter for other screening for malignant neoplasm of breast: Secondary | ICD-10-CM | POA: Diagnosis present

## 2018-08-04 DIAGNOSIS — M545 Low back pain, unspecified: Secondary | ICD-10-CM

## 2018-08-04 DIAGNOSIS — M85852 Other specified disorders of bone density and structure, left thigh: Secondary | ICD-10-CM | POA: Insufficient documentation

## 2018-08-04 DIAGNOSIS — Z1231 Encounter for screening mammogram for malignant neoplasm of breast: Secondary | ICD-10-CM | POA: Insufficient documentation

## 2018-08-04 DIAGNOSIS — N3941 Urge incontinence: Secondary | ICD-10-CM

## 2018-08-04 DIAGNOSIS — I10 Essential (primary) hypertension: Secondary | ICD-10-CM

## 2018-08-04 DIAGNOSIS — E2839 Other primary ovarian failure: Secondary | ICD-10-CM | POA: Insufficient documentation

## 2018-08-04 DIAGNOSIS — G8929 Other chronic pain: Secondary | ICD-10-CM

## 2018-08-04 DIAGNOSIS — G2581 Restless legs syndrome: Secondary | ICD-10-CM

## 2018-08-04 MED ORDER — CLONIDINE HCL 0.1 MG PO TABS: 0.1000 mg | ORAL_TABLET | Freq: Every evening | ORAL | 1 refills | Status: DC

## 2018-08-04 MED ORDER — OLMESARTAN MEDOXOMIL 40 MG PO TABS: 40.0000 mg | ORAL_TABLET | Freq: Every day | ORAL | 1 refills | Status: DC

## 2018-08-04 NOTE — Telephone Encounter (Signed)
Refill request for general medication: Clonidine 0.1 mg  Last office visit: 04/21/2018  Last physical exam: 03/16/2018  Refill request for Hypertension medication:  Olmesartan 40 mg  Last office visit pertaining to hypertension: 04/21/2018  BP Readings from Last 3 Encounters:  06/05/18 (!) 141/79  04/21/18 138/78  03/16/18 140/60   Lab Results  Component Value Date   CREATININE 1.01 (H) 01/20/2018   BUN 22 01/20/2018   NA 140 01/20/2018   K 4.3 01/20/2018   CL 105 01/20/2018   CO2 29 01/20/2018    Follow-ups on file. None Indicated

## 2018-09-06 ENCOUNTER — Other Ambulatory Visit: Payer: Self-pay | Admitting: *Deleted

## 2018-09-06 MED ORDER — ISOSORBIDE MONONITRATE ER 30 MG PO TB24: 30.0000 mg | ORAL_TABLET | Freq: Every day | ORAL | 0 refills | Status: DC

## 2018-09-06 MED ORDER — HYDRALAZINE HCL 50 MG PO TABS: 50.0000 mg | ORAL_TABLET | Freq: Three times a day (TID) | ORAL | 0 refills | Status: DC

## 2018-10-09 ENCOUNTER — Other Ambulatory Visit: Payer: Self-pay | Admitting: Cardiovascular Disease

## 2018-10-22 ENCOUNTER — Encounter: Payer: Self-pay | Admitting: Family Medicine

## 2018-10-22 ENCOUNTER — Other Ambulatory Visit: Payer: Self-pay

## 2018-10-22 ENCOUNTER — Ambulatory Visit (INDEPENDENT_AMBULATORY_CARE_PROVIDER_SITE_OTHER): Payer: Medicare Other | Admitting: Family Medicine

## 2018-10-22 VITALS — BP 107/65 | HR 69 | Temp 97.2°F

## 2018-10-22 DIAGNOSIS — I10 Essential (primary) hypertension: Secondary | ICD-10-CM | POA: Diagnosis not present

## 2018-10-22 DIAGNOSIS — G2581 Restless legs syndrome: Secondary | ICD-10-CM

## 2018-10-22 DIAGNOSIS — I471 Supraventricular tachycardia: Secondary | ICD-10-CM

## 2018-10-22 DIAGNOSIS — I200 Unstable angina: Secondary | ICD-10-CM | POA: Diagnosis not present

## 2018-10-22 DIAGNOSIS — E785 Hyperlipidemia, unspecified: Secondary | ICD-10-CM

## 2018-10-22 DIAGNOSIS — M720 Palmar fascial fibromatosis [Dupuytren]: Secondary | ICD-10-CM

## 2018-10-22 DIAGNOSIS — N3941 Urge incontinence: Secondary | ICD-10-CM

## 2018-10-22 DIAGNOSIS — E8881 Metabolic syndrome: Secondary | ICD-10-CM

## 2018-10-22 MED ORDER — OXYBUTYNIN CHLORIDE 5 MG PO TABS: 5.0000 mg | ORAL_TABLET | Freq: Two times a day (BID) | ORAL | 1 refills | Status: DC

## 2018-10-22 MED ORDER — CARVEDILOL 25 MG PO TABS: 25.0000 mg | ORAL_TABLET | Freq: Two times a day (BID) | ORAL | 1 refills | Status: DC

## 2018-10-22 MED ORDER — ATORVASTATIN CALCIUM 40 MG PO TABS: 40.0000 mg | ORAL_TABLET | Freq: Every day | ORAL | 1 refills | Status: DC

## 2018-10-22 MED ORDER — ROPINIROLE HCL 2 MG PO TABS: 2.0000 mg | ORAL_TABLET | Freq: Every day | ORAL | 1 refills | Status: DC

## 2018-10-22 NOTE — Progress Notes (Signed)
Name: Alyssa Lawson   MRN: [id number]    DOB: 08/10/1945   Date:10/22/2018       Progress Note  Subjective  Chief Complaint  Chief Complaint  Patient presents with  . Hypertension    I connected with  [name]  on 10/22/18 at 11:40 AM EDT by a video enabled telemedicine application and verified that I am speaking with the correct person using two identifiers.  I discussed the limitations of evaluation and management by telemedicine and the availability of in person appointments. The patient expressed understanding and agreed to proceed. Staff also discussed with the patient that there may be a patient responsible charge related to this service. Patient Location: home Provider Location: [facility]   HPI  HTN: takingcoreg 25mg  daily, Benicar 40 , hydralazine 50mg  TID and clonidine qhs. BP was at goal on her last visit. She denies recent episodes of chest pain or palpitation. Denies orthostatic changes. BP at home has been well controlled 130's70's, today at home about one hour part bp was 136/74 and 107/65 , no dizziness  Hyperlipidemia: last labs done  April 2019 and it was at goal, LDL 76  , taking Atorvastatin at night.She occasionally has muscle pain, but better with magnesium . Recheck next visit   Metabolic [id number] back in August as 6.2% she denies polyphagia or polydipsia. Shestopped taking metformin due to pt c/o swelling and weight gain with medication. Pt wasstarted Trulicity back in 32/2025 but stopped because of cost. She is now only on life style mofidication , we will recheck labs on her next visit. She states she has been very active at home, working in her yard. She also has a stationary bike that she uses 3 times a week for at least 30 minutes   Urinary incontinence: she has been on Ditropan for many years, but symptoms got worse, so we switched to [facility] however she states too expensive , she was advised to go up on ditropan dose but  is still only taking 5 mg bid , she is doing okay with medication, nocturia stable at once per night.   RLS: better with medication, sleeping well with medication,- Requip -Unchanged   Chronic Back Pain: she has daily low back pain, She takes Tylenol and  Methocarbamol prn.   Unstable angina: indigestion symptoms with abnormal EKG, normal cardiac cath done Nov 2018 by Dr. [name], on higher dose of Coreg, and Benicar also on Imdur and indigestion improved. Also on statin therapy and aspirin Unchanged   Plantar : she states continues to have nodules on both of her hands that still bother her, on palms of her hands, sore when using a groceries cart. She states seen by Ortho and injections and it did not help.    Patient Active Problem List   Diagnosis Date Noted  . Prediabetes 01/21/2018  . Atrial tachycardia ([place]) 08/25/2017  . Unstable angina ([place]) 05/01/2017  . Positive cardiac stress test 05/01/2017  . Chest pain 04/24/2017  . Abnormal EKG 04/24/2017  . Smoking history 04/24/2017  . Rotator cuff tendinitis, left 04/10/2017  . Cataracts, bilateral 11/25/2016  . Impingement syndrome of shoulder, left 10/13/2016  . Chronic bilateral low back pain without sciatica 08/20/2016  . Metabolic syndrome 42/70/6237  . Mitral regurgitation 06/22/2015  . Melasma 12/21/2014  . History of hysterectomy 12/21/2014  . Restless leg syndrome 12/21/2014  . Benign essential HTN 12/17/2014  . Cataract 12/17/2014  . Dyslipidemia 12/17/2014  . Gastric reflux 12/17/2014  .  History of cervical cancer 12/17/2014  . H/O iron deficiency anemia 12/17/2014  . Urinary incontinence in female 12/17/2014  . Dysmetabolic syndrome 08/65/7846  . TI (tricuspid incompetence) 12/17/2014  . Osteopenia of the elderly 12/17/2014    Past Surgical History:  Procedure Laterality Date  . ABDOMINAL HYSTERECTOMY  1976  . LEFT HEART CATH AND CORONARY ANGIOGRAPHY N/A 05/01/2017   Procedure: LEFT HEART CATH AND CORONARY  ANGIOGRAPHY;  Surgeon: Minna Merritts, MD;  Location: [place] [facility];  Service: Cardiovascular;  Laterality: N/A;  . OOPHORECTOMY      Family History  Problem Relation Age of Onset  . Heart failure Father   . Arrhythmia Father   . Hypertension Father   . Arrhythmia Sister   . Hypertension Sister   . Congestive Heart Failure Brother   . Hypertension Brother   . Cardiomyopathy Sister   . Diabetes Brother   . Breast cancer Paternal Aunt 54    Social History   Socioeconomic History  . Marital status: Married    Spouse name: Doren Custard  . Number of children: 1  . Years of education: some college  . Highest education level: 12th grade  Occupational History  . Occupation: Retired  Scientific laboratory technician  . Financial resource strain: Not hard at all  . Food insecurity:    Worry: Never true    Inability: Never true  . Transportation needs:    Medical: No    Non-medical: No  Tobacco Use  . Smoking status: Former Smoker    Packs/day: 0.50    Years: 18.00    Pack years: 9.00    Types: Cigarettes    Start date: 06/23/1956    Last attempt to quit: 11/22/1995    Years since quitting: 22.9  . Smokeless tobacco: Former Systems developer    Quit date: 11/22/1996  . Tobacco comment: smoking cessation materials not required  Substance and Sexual Activity  . Alcohol use: Yes    Alcohol/week: 0.0 standard drinks    Comment: socially  . Drug use: No  . Sexual activity: Yes    Partners: Male  Lifestyle  . Physical activity:    Days per week: 7 days    Minutes per session: 10 min  . Stress: Not at all  Relationships  . Social connections:    Talks on phone: Patient refused    Gets together: Patient refused    Attends religious service: Patient refused    Active member of club or organization: Patient refused    Attends meetings of clubs or organizations: Patient refused    Relationship status: Married  . Intimate partner violence:    Fear of current or ex partner: No    Emotionally abused: No     Physically abused: No    Forced sexual activity: No  Other Topics Concern  . 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  acetaminophen (TYLENOL) 500 MG tablet, Take 1 tablet (500 mg total) by mouth every 6 (six) hours as needed for headache., Disp: 30 tablet, Rfl: 0 .  aspirin 81 MG chewable tablet, Chew 1 tablet by mouth daily., Disp: , Rfl:  .  atorvastatin (LIPITOR) 40 MG tablet, Take 1 tablet (40 mg total) by mouth daily., Disp: 90 tablet, Rfl: 1 .  carvedilol (  COREG) 25 MG tablet, TAKE 1 TABLET TWICE DAILY WITH MEALS, Disp: 180 tablet, Rfl: 1 .  cetirizine-pseudoephedrine (ZYRTEC-D) 5-120 MG tablet, Take 1 tablet by mouth daily., Disp: 14 tablet, Rfl: 0 .  cloNIDine (CATAPRES) 0.1 MG tablet, Take 1 tablet (0.1 mg total) by mouth every evening., Disp: 90 tablet, Rfl: 1 .  diclofenac sodium (VOLTAREN) 1 % GEL, diclofenac 1 % topical gel, Disp: , Rfl:  .  ferrous sulfate 325 (65 FE) MG tablet, Take 325 mg by mouth daily with breakfast., Disp: , Rfl:  .  fluticasone (FLONASE) 50 MCG/ACT nasal spray, USE 1 SPRAY IN EACH NOSTRIL TWO TIMES DAILY, Disp: 48 g, Rfl: 1 .  hydrALAZINE (APRESOLINE) 50 MG tablet, Take 1 tablet (50 mg total) by mouth 3 (three) times daily., Disp: 270 tablet, Rfl: 0 .  isosorbide mononitrate (IMDUR) 30 MG 24 hr tablet, TAKE 1 TABLET BY MOUTH  DAILY, Disp: 90 tablet, Rfl: 0 .  Magnesium Oxide 400 (240 MG) MG TABS, Take 1 tablet by mouth daily., Disp: , Rfl:  .  methocarbamol (ROBAXIN) 500 MG tablet, Take 1 tablet (500 mg total) by mouth at bedtime as needed for muscle spasms., Disp: 90 tablet, Rfl: 1 .  Multiple Vitamin (MULTIVITAMIN) capsule, Take 1 capsule by mouth daily., Disp: , Rfl:  .  olmesartan (BENICAR) 40 MG tablet, Take 1 tablet (40 mg total) by mouth daily.,  Disp: 90 tablet, Rfl: 1 .  oxybutynin (DITROPAN) 5 MG tablet, Take 2 tablets (10 mg total) by mouth 2 (two) times daily., Disp: 360 tablet, Rfl: 0 .  rOPINIRole (REQUIP) 2 MG tablet, TAKE 1 TABLET AT BEDTIME, Disp: 90 tablet, Rfl: 1 .  valACYclovir (VALTREX) 500 MG tablet, TAKE 1 TABLET BY MOUTH 2  TIMES DAILY. PER EPISODE AS NEEDED, TAKE FOR 10 DAYS (Patient taking differently: TAKE 1 TABLET BY MOUTH 2  TIMES DAILY AS NEEDED. PER EPISODE AS NEEDED, TAKE FOR 10 DAYS), Disp: 90 tablet, Rfl: 0  Allergies  Allergen Reactions  . Amlodipine Swelling    I personally reviewed active problem list, medication list, allergies, family history, social history with the patient/caregiver today.   ROS  Ten systems reviewed and is negative except as mentioned in HPI   Objective  Virtual encounter, vitals obtained at home.  There is no height or weight on file to calculate BMI.  Physical Exam  Awake, alert and oriented and well groomed  PHQ2/9: Depression screen [facility] 2/9 10/22/2018 04/21/2018 03/16/2018 01/20/2018 08/21/2017  Decreased Interest 0 0 0 0 0  Down, Depressed, Hopeless 0 0 0 0 0  PHQ - 2 Score 0 0 0 0 0  Altered sleeping 0 0 0 0 -  Tired, decreased energy 0 0 0 0 -  Change in appetite 0 0 0 0 -  Feeling bad or failure about yourself  0 0 0 0 -  Trouble concentrating 0 0 0 0 -  Moving slowly or fidgety/restless 0 0 0 0 -  Suicidal thoughts 0 0 0 0 -  PHQ-9 Score 0 0 0 0 -  Difficult doing work/chores - Not difficult at all Not difficult at all Not difficult at all -   PHQ-2/9 Result is negative.    Fall Risk: Fall Risk  10/22/2018 04/21/2018 03/16/2018 01/20/2018 10/21/2017  Falls in the past year? 0 Yes Yes No No  Number falls in past yr: 0 1 1 - -  Injury with Fall? 0 Yes No - -  Comment - right knee - - -  Risk for fall due to : - - Impaired vision - -  Risk for fall due to: Comment - - wears eyeglasses - -  Follow up - - Falls evaluation completed;Education provided;Falls  prevention discussed - -    Assessment & Plan  1. Benign essential HTN  - carvedilol (COREG) 25 MG tablet; Take 1 tablet (25 mg total) by mouth 2 (two) times daily with a meal.  Dispense: 180 tablet; Refill: 1  2. Unstable angina (HCC)  stable  3. Dyslipidemia  - atorvastatin (LIPITOR) 40 MG tablet; Take 1 tablet (40 mg total) by mouth daily.  Dispense: 90 tablet; Refill: 1  4. Metabolic syndrome  On life style modification  5. Palmar fibromatosis   6. RLS (restless legs syndrome)  - rOPINIRole (REQUIP) 2 MG tablet; Take 1 tablet (2 mg total) by mouth at bedtime.  Dispense: 90 tablet; Refill: 1  7. Urge incontinence  - oxybutynin (DITROPAN) 5 MG tablet; Take 1 tablet (5 mg total) by mouth 2 (two) times daily.  Dispense: 180 tablet; Refill: 1  I discussed the assessment and treatment plan with the patient. The patient was provided an opportunity to ask questions and all were answered. The patient agreed with the plan and demonstrated an understanding of the instructions.  The patient was advised to call back or seek an in-person evaluation if the symptoms worsen or if the condition fails to improve as anticipated.  I provided 25 minutes of non-face-to-face time during this encounter.

## 2018-11-07 ENCOUNTER — Other Ambulatory Visit: Payer: Self-pay | Admitting: Cardiovascular Disease

## 2018-11-08 ENCOUNTER — Telehealth: Payer: Self-pay

## 2018-11-08 NOTE — Telephone Encounter (Signed)

## 2018-11-09 DIAGNOSIS — Z794 Long term (current) use of insulin: Secondary | ICD-10-CM | POA: Insufficient documentation

## 2018-11-09 DIAGNOSIS — E118 Type 2 diabetes mellitus with unspecified complications: Secondary | ICD-10-CM | POA: Insufficient documentation

## 2018-11-09 DIAGNOSIS — E782 Mixed hyperlipidemia: Secondary | ICD-10-CM | POA: Insufficient documentation

## 2018-11-09 DIAGNOSIS — F17200 Nicotine dependence, unspecified, uncomplicated: Secondary | ICD-10-CM | POA: Insufficient documentation

## 2018-11-09 NOTE — Progress Notes (Signed)
Virtual Visit via Telephone Note   This visit type was conducted due to national recommendations for restrictions regarding the COVID-19 Pandemic (e.g. social distancing) in an effort to limit this patient's exposure and mitigate transmission in our community.  Due to her co-morbid illnesses, this patient is at least at moderate risk for complications without adequate follow up.  This format is felt to be most appropriate for this patient at this time.  The patient did not have access to video technology/had technical difficulties with video requiring transitioning to audio format only (telephone).  All issues noted in this document were discussed and addressed.  No physical exam could be performed with this format.  Please refer to the patient's chart for her  consent to telehealth for Alyssa Lawson.   I connected with  Alyssa Lawson on 11/10/18 by a video enabled telemedicine application and verified that I am speaking with the correct person using two identifiers. I discussed the limitations of evaluation and management by telemedicine. The patient expressed understanding and agreed to proceed.   Evaluation Performed:  Follow-up visit  Date:  11/10/2018   ID:  Alyssa Lawson 08/01/1945, MRN [id number]  Patient Location:  [place] [address]   Provider location:   [facility], [place] office  PCP:  Steele Sizer, MD  Cardiologist:  Patsy Baltimore   Chief Complaint:  Labile pressure   History of Present Illness:    Alyssa Lawson is a 73 y.o. female who presents via audio/video conferencing for a telehealth visit today.   The patient does not symptoms concerning for COVID-19 infection (fever, chills, cough, or new SHORTNESS OF BREATH).   Patient has a past medical history of HTN Hyperlipidemia: 225 chol down to 163 on lipitor  Metabolic Syndrome last FKCL2X  5.6% , down from 6.3,  Smoked, quit 1998, started age 67, 51 years, <1  ppd Cardiac catheterization November 2018, no significant disease Who presents for f/u of her abnormal EKG , chest pain, mitral valve regurgitation  Active Has an exercise bike Works in the yard  No new labs since 2019  No chest pain,  Little tachycardia, 1 second,, not bad  No SOB  Periodic anxiety, in general this is stable  Urinary incontinence: RLS Chronic Back Pain  Wheezing  Rotator cuff tendinitis left shoulder   Prior CV studies:   The following studies were reviewed today:  Stress test 04/2017 Pharmacological myocardial perfusion imaging study with large region of ischemia in the mid to apical anteroseptal wall and entire lateral wall Normal wall motion, EF estimated at 63% No EKG changes concerning for ischemia at peak stress or in recovery. Poor exercise tolerance, 3: 30 min GI uptake artifact noted High risk scan  Cath 05/01/2017 False positive stress test  MR noted Atrial tachycardia after the case, given metoprolol IV  Echo 05/01/2017 Mild to moderate MR EF >55 Small to moderate pericardial effusion   Past Medical History:  Diagnosis Date  . Bladder disorder    a. urinary incontinence  . Cancer ([place])    cervical ca  . Coronary artery disease, non-occlusive    a. LHC 05/01/2017: LM no sig dz, LAD no sig dz, LCx no sig dz, RCA no sig dz, EF 55-65%, mod MR  . Endometriosis   . GERD (gastroesophageal reflux disease)   . Hyperlipidemia   . Hypertension   . Mitral regurgitation    a. TTE 05/01/2017: EF  55-60%, no RWMA, not technically sufficient to allow for LV diastolic function, mild to moderate mitral regurgitation, mildly dilated left atrium, RV systolic function normal, PASP 35 mmHg, small to moderate pericardial effusion was noted  . Restless leg syndrome    Past Surgical History:  Procedure Laterality Date  . ABDOMINAL HYSTERECTOMY  1976  . LEFT HEART CATH AND CORONARY ANGIOGRAPHY N/A 05/01/2017   Procedure: LEFT HEART CATH AND CORONARY  ANGIOGRAPHY;  Surgeon: Minna Merritts, MD;  Location: [place] [facility];  Service: Cardiovascular;  Laterality: N/A;  . OOPHORECTOMY       No outpatient medications have been marked as taking for the 11/10/18 encounter (Appointment) with Minna Merritts, MD.     Allergies:   Amlodipine   Social History   Tobacco Use  . Smoking status: Former Smoker    Packs/day: 0.50    Years: 18.00    Pack years: 9.00    Types: Cigarettes    Start date: 06/23/1956    Last attempt to quit: 11/22/1995    Years since quitting: 22.9  . Smokeless tobacco: Former Systems developer    Quit date: 11/22/1996  . Tobacco comment: smoking cessation materials not required  Substance Use Topics  . Alcohol use: Yes    Alcohol/week: 0.0 standard drinks    Comment: socially  . Drug use: No     Current Outpatient Medications on File Prior to Visit  Medication Sig Dispense Refill  . acetaminophen (TYLENOL) 500 MG tablet Take 1 tablet (500 mg total) by mouth every 6 (six) hours as needed for headache. 30 tablet 0  . aspirin 81 MG chewable tablet Chew 1 tablet by mouth daily.    Marland Kitchen atorvastatin (LIPITOR) 40 MG tablet Take 1 tablet (40 mg total) by mouth daily. 90 tablet 1  . carvedilol (COREG) 25 MG tablet Take 1 tablet (25 mg total) by mouth 2 (two) times daily with a meal. 180 tablet 1  . cetirizine-pseudoephedrine (ZYRTEC-D) 5-120 MG tablet Take 1 tablet by mouth daily. 14 tablet 0  . cloNIDine (CATAPRES) 0.1 MG tablet Take 1 tablet (0.1 mg total) by mouth every evening. 90 tablet 1  . diclofenac sodium (VOLTAREN) 1 % GEL diclofenac 1 % topical gel    . ferrous sulfate 325 (65 FE) MG tablet Take 325 mg by mouth daily with breakfast.    . fluticasone (FLONASE) 50 MCG/ACT nasal spray USE 1 SPRAY IN EACH NOSTRIL TWO TIMES DAILY 48 g 1  . hydrALAZINE (APRESOLINE) 50 MG tablet TAKE 1 TABLET BY MOUTH 3  TIMES DAILY 270 tablet 0  . isosorbide mononitrate (IMDUR) 30 MG 24 hr tablet TAKE 1 TABLET BY MOUTH  DAILY 90 tablet 0  .  Magnesium Oxide 400 (240 MG) MG TABS Take 1 tablet by mouth daily.    . methocarbamol (ROBAXIN) 500 MG tablet Take 1 tablet (500 mg total) by mouth at bedtime as needed for muscle spasms. 90 tablet 1  . Multiple Vitamin (MULTIVITAMIN) capsule Take 1 capsule by mouth daily.    Marland Kitchen olmesartan (BENICAR) 40 MG tablet Take 1 tablet (40 mg total) by mouth daily. 90 tablet 1  . oxybutynin (DITROPAN) 5 MG tablet Take 1 tablet (5 mg total) by mouth 2 (two) times daily. 180 tablet 1  . rOPINIRole (REQUIP) 2 MG tablet Take 1 tablet (2 mg total) by mouth at bedtime. 90 tablet 1  . valACYclovir (VALTREX) 500 MG tablet TAKE 1 TABLET BY MOUTH 2  TIMES DAILY. PER EPISODE AS  NEEDED, TAKE FOR 10 DAYS (Patient taking differently: TAKE 1 TABLET BY MOUTH 2  TIMES DAILY AS NEEDED. PER EPISODE AS NEEDED, TAKE FOR 10 DAYS) 90 tablet 0   No current facility-administered medications on file prior to visit.      Family Hx: The patient's family history includes Arrhythmia in her father and sister; Breast cancer (age of onset: 52) in her paternal aunt; Cardiomyopathy in her sister; Congestive Heart Failure in her brother; Diabetes in her brother; Heart failure in her father; Hypertension in her brother, father, and sister.  ROS:   Please see the history of present illness.    Review of Systems  Constitutional: Negative.   HENT: Negative.   Respiratory: Negative.   Cardiovascular: Negative.   Gastrointestinal: Negative.   Musculoskeletal: Negative.   Neurological: Negative.   Psychiatric/Behavioral: Negative.   All other systems reviewed and are negative.     Labs/Other Tests and Data Reviewed:    Recent Labs: 01/20/2018: ALT 19; BUN 22; Creat 1.01; Potassium 4.3; Sodium 140   Recent Lipid Panel Lab Results  Component Value Date/Time   CHOL 163 09/21/2017 02:12 PM   CHOL 225 (H) 06/22/2015 12:32 PM   TRIG 101 09/21/2017 02:12 PM   HDL 69 09/21/2017 02:12 PM   HDL 76 06/22/2015 12:32 PM   CHOLHDL 2.4  09/21/2017 02:12 PM   LDLCALC 76 09/21/2017 02:12 PM    Wt Readings from Last 3 Encounters:  06/05/18 165 lb (74.8 kg)  04/21/18 167 lb 12.8 oz (76.1 kg)  03/16/18 168 lb 6.4 oz (76.4 kg)     Exam:    Vital Signs: Vital signs may also be detailed in the HPI There were no vitals taken for this visit.  Wt Readings from Last 3 Encounters:  06/05/18 165 lb (74.8 kg)  04/21/18 167 lb 12.8 oz (76.1 kg)  03/16/18 168 lb 6.4 oz (76.4 kg)   Temp Readings from Last 3 Encounters:  10/22/18 (!) 97.2 F (36.2 C) (Oral)  06/05/18 98.1 F (36.7 C) (Oral)  04/21/18 98 F (36.7 C) (Oral)   BP Readings from Last 3 Encounters:  10/22/18 107/65  06/05/18 (!) 141/79  04/21/18 138/78   Pulse Readings from Last 3 Encounters:  10/22/18 69  06/05/18 (!) 57  04/21/18 65     Pulse60  143/76, later 121/61 resp 16  Well nourished, well developed female in no acute distress. Constitutional:  oriented to person, place, and time. No distress.    ASSESSMENT & PLAN:    Atrial tachycardia (HCC) No sx, she will continue on beta-blocker No further work-up needed  Type 2 diabetes mellitus with complication, with long-term current use of insulin (HCC) Reports weight is stable, previously A1c well controlled Managed by primary care  Mixed hyperlipidemia Cholesterol is at goal on the current lipid regimen. No changes to the medications were made.  Smoker Remote smoker, has not smoked in many years  Benign essential HTN Blood pressure is well controlled on today's visit. No changes made to the medications.   COVID-19 Education: The signs and symptoms of COVID-19 were discussed with the patient and how to seek care for testing (follow up with PCP or arrange E-visit).  The importance of social distancing was discussed today.  Patient Risk:   After full review of this patients clinical status, I feel that they are at least moderate risk at this time.  Time:   Today, I have spent 25 minutes  with the patient with telehealth technology discussing  the cardiac and medical problems/diagnoses detailed above   10 min spent reviewing the chart prior to patient visit today   Medication Adjustments/Labs and Tests Ordered: Current medicines are reviewed at length with the patient today.  Concerns regarding medicines are outlined above.   Tests Ordered: No tests ordered   Medication Changes: No changes made   Disposition: Follow-up as needed   Signed, Ida Rogue, MD  11/10/2018 3:41 PM    [place] Office [address] [place] [address], [place], [place] [address]

## 2018-11-10 ENCOUNTER — Telehealth (INDEPENDENT_AMBULATORY_CARE_PROVIDER_SITE_OTHER): Payer: Medicare Other | Admitting: Cardiovascular Disease

## 2018-11-10 ENCOUNTER — Other Ambulatory Visit: Payer: Self-pay

## 2018-11-10 DIAGNOSIS — I471 Supraventricular tachycardia: Secondary | ICD-10-CM

## 2018-11-10 DIAGNOSIS — E118 Type 2 diabetes mellitus with unspecified complications: Secondary | ICD-10-CM

## 2018-11-10 DIAGNOSIS — E782 Mixed hyperlipidemia: Secondary | ICD-10-CM

## 2018-11-10 DIAGNOSIS — I10 Essential (primary) hypertension: Secondary | ICD-10-CM | POA: Diagnosis not present

## 2018-11-10 DIAGNOSIS — F17200 Nicotine dependence, unspecified, uncomplicated: Secondary | ICD-10-CM

## 2018-11-10 DIAGNOSIS — Z794 Long term (current) use of insulin: Secondary | ICD-10-CM

## 2018-11-10 NOTE — Patient Instructions (Signed)

## 2018-11-30 ENCOUNTER — Other Ambulatory Visit: Payer: Self-pay | Admitting: Family Medicine

## 2018-11-30 ENCOUNTER — Other Ambulatory Visit: Payer: Self-pay

## 2018-11-30 DIAGNOSIS — I10 Essential (primary) hypertension: Secondary | ICD-10-CM

## 2018-11-30 MED ORDER — FLUTICASONE PROPIONATE 50 MCG/ACT NA SUSP: NASAL | 5 refills | Status: DC

## 2018-12-03 ENCOUNTER — Encounter: Payer: Self-pay | Admitting: Family Medicine

## 2018-12-03 ENCOUNTER — Other Ambulatory Visit: Payer: Self-pay | Admitting: Emergency Medicine

## 2018-12-03 MED ORDER — HYDRALAZINE HCL 50 MG PO TABS: 50.0000 mg | ORAL_TABLET | Freq: Three times a day (TID) | ORAL | 3 refills | Status: DC

## 2018-12-04 MED ORDER — FLUTICASONE PROPIONATE 50 MCG/ACT NA SUSP: NASAL | 1 refills | Status: DC

## 2018-12-07 ENCOUNTER — Other Ambulatory Visit: Payer: Self-pay | Admitting: Family Medicine

## 2018-12-07 DIAGNOSIS — I10 Essential (primary) hypertension: Secondary | ICD-10-CM

## 2018-12-22 ENCOUNTER — Other Ambulatory Visit: Payer: Self-pay | Admitting: Family Medicine

## 2018-12-22 DIAGNOSIS — I10 Essential (primary) hypertension: Secondary | ICD-10-CM

## 2019-01-02 ENCOUNTER — Other Ambulatory Visit: Payer: Self-pay | Admitting: Cardiovascular Disease

## 2019-01-24 ENCOUNTER — Encounter: Payer: Self-pay | Admitting: Family Medicine

## 2019-01-24 ENCOUNTER — Ambulatory Visit (INDEPENDENT_AMBULATORY_CARE_PROVIDER_SITE_OTHER): Payer: Medicare Other | Admitting: Family Medicine

## 2019-01-24 ENCOUNTER — Other Ambulatory Visit: Payer: Self-pay

## 2019-01-24 VITALS — BP 140/70 | HR 79 | Temp 97.8°F | Resp 16 | Ht 65.0 in | Wt 178.2 lb

## 2019-01-24 DIAGNOSIS — I471 Supraventricular tachycardia: Secondary | ICD-10-CM

## 2019-01-24 DIAGNOSIS — I10 Essential (primary) hypertension: Secondary | ICD-10-CM | POA: Diagnosis not present

## 2019-01-24 DIAGNOSIS — E1169 Type 2 diabetes mellitus with other specified complication: Secondary | ICD-10-CM

## 2019-01-24 DIAGNOSIS — E8881 Metabolic syndrome: Secondary | ICD-10-CM

## 2019-01-24 DIAGNOSIS — I200 Unstable angina: Secondary | ICD-10-CM | POA: Diagnosis not present

## 2019-01-24 DIAGNOSIS — E785 Hyperlipidemia, unspecified: Secondary | ICD-10-CM | POA: Diagnosis not present

## 2019-01-24 DIAGNOSIS — G2581 Restless legs syndrome: Secondary | ICD-10-CM

## 2019-01-24 LAB — POCT GLYCOSYLATED HEMOGLOBIN (HGB A1C): Hemoglobin A1C: 6.2 % — AB (ref 4.0–5.6)

## 2019-01-24 MED ORDER — HYDRALAZINE HCL 50 MG PO TABS: 50.0000 mg | ORAL_TABLET | Freq: Four times a day (QID) | ORAL | 1 refills | Status: DC

## 2019-01-24 NOTE — Progress Notes (Signed)
Name: Alyssa Lawson   MRN: [id number]    DOB: January 05, 1946   Date:01/24/2019       Progress Note  Subjective  Chief Complaint  Chief Complaint  Patient presents with  . Medication Refill  . Hypertension  . Hyperlipidemia  . RLS  . Chronic Back Pain  . Metabolic Syndrome  . Urinary Incontinence  . Unstable angina    HPI  HTN: takingcoreg 25mg  daily, Benicar 40 , hydralazine 50mg  TID and clonidine qhs. BP usually between 140-150 when she first gets up and normalizes during the day, today also elevated when she first got here. She states takes last dose of hydralazine around 8 pm but goes to bed around midnight, we will add one extra hydralazine at bedtime and monitor.   Hyperlipidemia: last labs done over one year ago. She is taking atorvastatin and denies muscle aches   DMII: diagnosed with diabetes years ago, since 2015 A1C highest level was 6.4% , today it was 6.2% Shestopped taking metformin due to pt c/o swelling and weight gain with medication. Pt wastarted Trulicity back in 06/7508 but stopped because of cost. She is now only on life style mofidication , avoids sweets, she still drinks regular sodas but only seldom. She denies polyphagia, polydipsia or polyuria   Urinary incontinence: she has been on Ditropan , tolerating medication well, nocturia at most once at night, she also discussed kegel exercises due to some stress incontinence   RLS: better with medication, sleeping well with medication,- Requip , unchanged   Chronic Back Pain: she has daily low back pain, She takes Tylenol prn, mobicand also methocarbamol, She left her job back in May 2018 - she takes methocarbamol prn   Unstable angina: indigestion symptoms with abnormal EKG, normal cardiac cath done Nov 2018 by Dr. [name], on higher dose of Coreg, and Benicar also on Imdur and indigestion improved. Also on statin therapy and aspirin No recent episodes . Still sees cardriologist  Patient Active Problem List    Diagnosis Date Noted  . Type 2 diabetes mellitus with complication, with long-term current use of insulin ([place]) 11/09/2018  . Mixed hyperlipidemia 11/09/2018  . Smoker 11/09/2018  . Atrial tachycardia ([place]) 08/25/2017  . Unstable angina ([place]) 05/01/2017  . Positive cardiac stress test 05/01/2017  . Abnormal EKG 04/24/2017  . Smoking history 04/24/2017  . Rotator cuff tendinitis, left 04/10/2017  . Cataracts, bilateral 11/25/2016  . Impingement syndrome of shoulder, left 10/13/2016  . Chronic bilateral low back pain without sciatica 08/20/2016  . Metabolic syndrome 25/85/2778  . Mitral regurgitation 06/22/2015  . Melasma 12/21/2014  . History of hysterectomy 12/21/2014  . Restless leg syndrome 12/21/2014  . Benign essential HTN 12/17/2014  . Cataract 12/17/2014  . Dyslipidemia 12/17/2014  . Gastric reflux 12/17/2014  . History of cervical cancer 12/17/2014  . H/O iron deficiency anemia 12/17/2014  . Urinary incontinence in female 12/17/2014  . Dysmetabolic syndrome 24/23/5361  . TI (tricuspid incompetence) 12/17/2014  . Osteopenia of the elderly 12/17/2014    Past Surgical History:  Procedure Laterality Date  . ABDOMINAL HYSTERECTOMY  1976  . LEFT HEART CATH AND CORONARY ANGIOGRAPHY N/A 05/01/2017   Procedure: LEFT HEART CATH AND CORONARY ANGIOGRAPHY;  Surgeon: Minna Merritts, MD;  Location: [facility];  Service: Cardiovascular;  Laterality: N/A;  . OOPHORECTOMY      Family History  Problem Relation Age of Onset  . Heart failure Father   . Arrhythmia Father   . Hypertension Father   .  Arrhythmia Sister   . Hypertension Sister   . Congestive Heart Failure Brother   . Hypertension Brother   . Cardiomyopathy Sister   . Diabetes Brother   . Breast cancer Paternal Aunt 88    Social History   Socioeconomic History  . Marital status: Married    Spouse name: Doren Custard  . Number of children: 1  . Years of education: some college  . Highest education  level: 12th grade  Occupational History  . Occupation: Retired  Scientific laboratory technician  . Financial resource strain: Not hard at all  . Food insecurity    Worry: Never true    Inability: Never true  . Transportation needs    Medical: No    Non-medical: No  Tobacco Use  . Smoking status: Former Smoker    Packs/day: 0.50    Years: 18.00    Pack years: 9.00    Types: Cigarettes    Start date: 06/23/1956    Quit date: 11/22/1995    Years since quitting: 23.1  . Smokeless tobacco: Former Systems developer    Quit date: 11/22/1996  . Tobacco comment: smoking cessation materials not required  Substance and Sexual Activity  . Alcohol use: Yes    Alcohol/week: 0.0 standard drinks    Comment: socially  . Drug use: No  . Sexual activity: Yes    Partners: Male  Lifestyle  . Physical activity    Days per week: 7 days    Minutes per session: 10 min  . Stress: Not at all  Relationships  . Social Herbalist on phone: Patient refused    Gets together: Patient refused    Attends religious service: Patient refused    Active member of club or organization: Patient refused    Attends meetings of clubs or organizations: Patient refused    Relationship status: Married  . Intimate partner violence    Fear of current or ex partner: No    Emotionally abused: No    Physically abused: No    Forced sexual activity: No  Other Topics Concern  . 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  acetaminophen (TYLENOL) 500 MG tablet, Take 1 tablet (500 mg total) by mouth every 6 (six) hours as needed for headache., Disp: 30 tablet, Rfl: 0 .  aspirin 81 MG chewable tablet, Chew 1 tablet by mouth daily., Disp: , Rfl:  .  atorvastatin (LIPITOR) 40 MG tablet, Take 1 tablet (40 mg total) by mouth daily., Disp: 90 tablet, Rfl:  1 .  carvedilol (COREG) 25 MG tablet, Take 1 tablet (25 mg total) by mouth 2 (two) times daily with a meal., Disp: 180 tablet, Rfl: 1 .  cloNIDine (CATAPRES) 0.1 MG tablet, TAKE 1 TABLET BY MOUTH  EVERY EVENING, Disp: 90 tablet, Rfl: 0 .  ferrous sulfate 325 (65 FE) MG tablet, Take 325 mg by mouth daily with breakfast., Disp: , Rfl:  .  fluticasone (FLONASE) 50 MCG/ACT nasal spray, USE 1 SPRAY IN EACH NOSTRIL TWO TIMES DAILY, Disp: 48 g, Rfl: 1 .  hydrALAZINE (APRESOLINE) 50 MG tablet, Take 1 tablet (50 mg total) by mouth 4 (four) times daily., Disp: 360 tablet, Rfl: 1 .  isosorbide mononitrate (IMDUR) 30 MG 24 hr tablet, TAKE 1 TABLET BY MOUTH  DAILY, Disp: 90 tablet, Rfl: 3 .  Magnesium Oxide 400 (240 MG) MG TABS, Take 1 tablet by mouth daily., Disp: , Rfl:  .  methocarbamol (ROBAXIN) 500 MG tablet, Take 1 tablet (500 mg total) by mouth at bedtime as needed for muscle spasms., Disp: 90 tablet, Rfl: 1 .  Multiple Vitamin (MULTIVITAMIN) capsule, Take 1 capsule by mouth daily., Disp: , Rfl:  .  olmesartan (BENICAR) 40 MG tablet, TAKE 1 TABLET BY MOUTH  DAILY, Disp: 90 tablet, Rfl: 0 .  oxybutynin (DITROPAN) 5 MG tablet, Take 1 tablet (5 mg total) by mouth 2 (two) times daily., Disp: 180 tablet, Rfl: 1 .  rOPINIRole (REQUIP) 2 MG tablet, Take 1 tablet (2 mg total) by mouth at bedtime., Disp: 90 tablet, Rfl: 1 .  valACYclovir (VALTREX) 500 MG tablet, TAKE 1 TABLET BY MOUTH 2  TIMES DAILY. PER EPISODE AS NEEDED, TAKE FOR 10 DAYS (Patient taking differently: TAKE 1 TABLET BY MOUTH 2  TIMES DAILY AS NEEDED. PER EPISODE AS NEEDED, TAKE FOR 10 DAYS), Disp: 90 tablet, Rfl: 0 .  diclofenac sodium (VOLTAREN) 1 % GEL, diclofenac 1 % topical gel, Disp: , Rfl:   Allergies  Allergen Reactions  . Amlodipine Swelling    I personally reviewed active problem list, medication list, allergies, family history, social history with the patient/caregiver today.   ROS  Constitutional: Negative for fever or weight  change.  Respiratory: Negative for cough and shortness of breath.   Cardiovascular: Negative for chest pain or palpitations.  Gastrointestinal: Negative for abdominal pain, no bowel changes.  Musculoskeletal: Negative for gait problem or joint swelling.  Skin: Negative for rash.  Neurological: Negative for dizziness or headache.  No other specific complaints in a complete review of systems (except as listed in HPI above).  Objective  Vitals:   01/24/19 1418 01/24/19 1422  BP: (!) 156/80 140/70  Pulse: 79   Resp: 16   Temp: 97.8 F (36.6 C)   TempSrc: Temporal   SpO2: 94%   Weight: 178 lb 3.2 oz (80.8 kg)   Height: 5\' 5"  (1.651 m)     Body mass index is 29.65 kg/m.  Physical Exam  Constitutional: Patient appears well-developed and well-nourished. Overweight. No distress.  HEENT: head atraumatic, normocephalic, pupils equal and reactive to light, neck supple Cardiovascular: Normal rate, regular rhythm and normal heart sounds.  No murmur heard. No BLE edema. Pulmonary/Chest: Effort normal and breath sounds normal. No respiratory distress. Abdominal: Soft.  There is no tenderness. Psychiatric: Patient has a normal mood and affect. behavior is normal. Judgment and thought content normal.  Diabetic Foot Exam: Diabetic Foot Exam - Simple   Simple Foot Form Diabetic Foot exam was performed with the following findings: Yes 01/24/2019  2:40 PM  Visual Inspection No deformities, no ulcerations, no other skin breakdown bilaterally: Yes Sensation Testing Intact to touch and monofilament testing bilaterally: Yes Pulse Check Posterior Tibialis and Dorsalis pulse intact bilaterally: Yes Comments      PHQ2/9: Depression screen [facility] 2/9 01/24/2019 10/22/2018 04/21/2018 03/16/2018 01/20/2018  Decreased Interest 0 0 0 0 0  Down, Depressed, Hopeless 0 0 0 0 0  PHQ - 2 Score 0 0 0 0 0  Altered sleeping 0 0 0 0 0  Tired, decreased energy 0 0 0 0 0  Change in appetite 0 0 0 0 0  Feeling bad  or failure about yourself  0 0 0 0 0  Trouble concentrating 0 0 0 0 0  Moving slowly  or fidgety/restless 0 0 0 0 0  Suicidal thoughts 0 0 0 0 0  PHQ-9 Score 0 0 0 0 0  Difficult doing work/chores - - Not difficult at all Not difficult at all Not difficult at all    phq 9 is negative   Fall Risk: Fall Risk  01/24/2019 10/22/2018 04/21/2018 03/16/2018 01/20/2018  Falls in the past year? 0 0 Yes Yes No  Number falls in past yr: 0 0 1 1 -  Injury with Fall? 0 0 Yes No -  Comment - - right knee - -  Risk for fall due to : - - - Impaired vision -  Risk for fall due to: Comment - - - wears eyeglasses -  Follow up - - - Falls evaluation completed;Education provided;Falls prevention discussed -       Functional Status Survey: Is the patient deaf or have difficulty hearing?: No Does the patient have difficulty seeing, even when wearing glasses/contacts?: No Does the patient have difficulty concentrating, remembering, or making decisions?: No Does the patient have difficulty walking or climbing stairs?: No Does the patient have difficulty dressing or bathing?: No Does the patient have difficulty doing errands alone such as visiting a doctor's office or shopping?: No    Assessment & Plan   1. Benign essential HTN  - hydrALAZINE (APRESOLINE) 50 MG tablet; Take 1 tablet (50 mg total) by mouth 4 (four) times daily.  Dispense: 360 tablet; Refill: 1 - COMPLETE METABOLIC PANEL WITH GFR - CBC with Differential/Platelet  2. Unstable angina (HCC)  Doing well  3. Dyslipidemia  - Lipid panel  4. Metabolic syndrome   5. Atrial tachycardia (HCC)  Rate controlled  6. RLS (restless legs syndrome)  Taking Requip  7. Dyslipidemia associated with type 2 diabetes mellitus (HCC)  - POCT HgB A1C - Microalbumin / creatinine urine ratio

## 2019-01-25 LAB — CBC WITH DIFFERENTIAL/PLATELET
Absolute Monocytes: 498 cells/uL (ref 200–950)
Basophils Absolute: 30 cells/uL (ref 0–200)
Basophils Relative: 0.5 %
Eosinophils Absolute: 216 cells/uL (ref 15–500)
Eosinophils Relative: 3.6 %
HCT: 39.6 % (ref 35.0–45.0)
Hemoglobin: 14 g/dL (ref 11.7–15.5)
Lymphs Abs: 1518 cells/uL (ref 850–3900)
MCH: 31.7 pg (ref 27.0–33.0)
MCHC: 35.4 g/dL (ref 32.0–36.0)
MCV: 89.8 fL (ref 80.0–100.0)
MPV: 11.8 fL (ref 7.5–12.5)
Monocytes Relative: 8.3 %
Neutro Abs: 3738 cells/uL (ref 1500–7800)
Neutrophils Relative %: 62.3 %
Platelets: 178 10*3/uL (ref 140–400)
RBC: 4.41 10*6/uL (ref 3.80–5.10)
RDW: 12.4 % (ref 11.0–15.0)
Total Lymphocyte: 25.3 %
WBC: 6 10*3/uL (ref 3.8–10.8)

## 2019-01-25 LAB — MICROALBUMIN / CREATININE URINE RATIO
Creatinine, Urine: 80 mg/dL (ref 20–275)
Microalb Creat Ratio: 5 mcg/mg creat (ref <30)
Microalb, Ur: 0.4 mg/dL

## 2019-01-25 LAB — COMPLETE METABOLIC PANEL WITH GFR
AG Ratio: 1.7 (calc) (ref 1.0–2.5)
ALT: 23 U/L (ref 6–29)
AST: 22 U/L (ref 10–35)
Albumin: 4.3 g/dL (ref 3.6–5.1)
Alkaline phosphatase (APISO): 68 U/L (ref 37–153)
BUN: 21 mg/dL (ref 7–25)
CO2: 29 mmol/L (ref 20–32)
Calcium: 9.6 mg/dL (ref 8.6–10.4)
Chloride: 104 mmol/L (ref 98–110)
Creat: 0.91 mg/dL (ref 0.60–0.93)
GFR, Est African American: 73 mL/min/{1.73_m2} (ref >=60)
GFR, Est Non African American: 63 mL/min/{1.73_m2} (ref >=60)
Globulin: 2.6 g/dL (calc) (ref 1.9–3.7)
Glucose, Bld: 107 mg/dL — ABNORMAL HIGH (ref 65–99)
Potassium: 4.1 mmol/L (ref 3.5–5.3)
Sodium: 141 mmol/L (ref 135–146)
Total Bilirubin: 0.7 mg/dL (ref 0.2–1.2)
Total Protein: 6.9 g/dL (ref 6.1–8.1)

## 2019-01-25 LAB — LIPID PANEL
Cholesterol: 146 mg/dL (ref <200)
HDL: 63 mg/dL (ref >=50)
LDL Cholesterol (Calc): 66 mg/dL (calc)
Non-HDL Cholesterol (Calc): 83 mg/dL (calc) (ref <130)
Total CHOL/HDL Ratio: 2.3 (calc) (ref <5.0)
Triglycerides: 90 mg/dL (ref <150)

## 2019-01-28 ENCOUNTER — Encounter: Payer: Self-pay | Admitting: Family Medicine

## 2019-02-09 ENCOUNTER — Other Ambulatory Visit: Payer: Self-pay | Admitting: Family Medicine

## 2019-02-09 DIAGNOSIS — I10 Essential (primary) hypertension: Secondary | ICD-10-CM

## 2019-02-17 LAB — HM DIABETES EYE EXAM

## 2019-02-23 ENCOUNTER — Other Ambulatory Visit: Payer: Self-pay | Admitting: Family Medicine

## 2019-02-23 DIAGNOSIS — I10 Essential (primary) hypertension: Secondary | ICD-10-CM

## 2019-03-01 ENCOUNTER — Encounter: Payer: Self-pay | Admitting: Family Medicine

## 2019-03-02 ENCOUNTER — Other Ambulatory Visit: Payer: Self-pay

## 2019-03-02 ENCOUNTER — Encounter: Payer: Self-pay | Admitting: Family Medicine

## 2019-03-02 ENCOUNTER — Ambulatory Visit (INDEPENDENT_AMBULATORY_CARE_PROVIDER_SITE_OTHER): Payer: Medicare Other | Admitting: Family Medicine

## 2019-03-02 VITALS — BP 122/77 | HR 58 | Temp 96.9°F

## 2019-03-02 DIAGNOSIS — R6883 Chills (without fever): Secondary | ICD-10-CM | POA: Diagnosis not present

## 2019-03-02 DIAGNOSIS — R5383 Other fatigue: Secondary | ICD-10-CM

## 2019-03-02 DIAGNOSIS — J029 Acute pharyngitis, unspecified: Secondary | ICD-10-CM | POA: Diagnosis not present

## 2019-03-02 NOTE — Progress Notes (Signed)
Name: Alyssa Lawson   MRN: [id number]    DOB: 06/24/1945   Date:03/02/2019       Progress Note  Subjective  Chief Complaint  Chief Complaint  Patient presents with  . Covid Exposure    Body aches yesterday. She took Ibuprofen to feel better. She had chills on Monday, but no temperture. She had a mild headache for 2 days which is unusual for him. She has not been in contact with anyone with positive test. Her son tested positive but she had not been around him. She slept all day long on Monday. Today she has fatigue and nauseous.    I connected with  Alyssa Lawson  on 03/02/19 at  1:00 PM EDT by a video enabled telemedicine application and verified that I am speaking with the correct person using two identifiers.  I discussed the limitations of evaluation and management by telemedicine and the availability of in person appointments. The patient expressed understanding and agreed to proceed. Staff also discussed with the patient that there may be a patient responsible charge related to this service. Patient Location: at home Provider Location: [facility]   HPI  Possible COVID-19 symptoms: she had a birthday party on Saturday in [place] with 10 relatives at her niece's house and backyard. [place] - she states at least 63 people , she spent the day outdoors and also on the boat. She felt [place] the [place], woke up on Monday feeling exhausted and has been having chills since. She noticed some nasal congestion and mild sore throat since this morning but she thinks secondary to allergies. She has been fever free . Denies lack of taste of sense of smell. Discussed importance of self isolation until results are back and to contact us is any sob, wheezing or change./worsenign of symptoms  Patient Active Problem List   Diagnosis Date Noted  . Type 2 diabetes mellitus with complication, with long-term current use of insulin  ([place]) 11/09/2018  . Mixed hyperlipidemia 11/09/2018  . Smoker 11/09/2018  . Atrial tachycardia ([place]) 08/25/2017  . Unstable angina ([place]) 05/01/2017  . Positive cardiac stress test 05/01/2017  . Abnormal EKG 04/24/2017  . Smoking history 04/24/2017  . Rotator cuff tendinitis, left 04/10/2017  . Cataracts, bilateral 11/25/2016  . Impingement syndrome of shoulder, left 10/13/2016  . Chronic bilateral low back pain without sciatica 08/20/2016  . Metabolic syndrome [id number]  . Mitral regurgitation 06/22/2015  . Melasma 12/21/2014  . History of hysterectomy 12/21/2014  . Restless leg syndrome 12/21/2014  . Benign essential HTN 12/17/2014  . Cataract 12/17/2014  . Dyslipidemia 12/17/2014  . Gastric reflux 12/17/2014  . History of cervical cancer 12/17/2014  . H/O iron deficiency anemia 12/17/2014  . Urinary incontinence in female 12/17/2014  . Dysmetabolic syndrome [id number]  . TI (tricuspid incompetence) 12/17/2014  . Osteopenia of the elderly 12/17/2014    Past Surgical History:  Procedure Laterality Date  . ABDOMINAL HYSTERECTOMY  1976  . LEFT HEART CATH AND CORONARY ANGIOGRAPHY N/A 05/01/2017   Procedure: LEFT HEART CATH AND CORONARY ANGIOGRAPHY;  Surgeon: Minna Merritts, MD;  Location: [facility];  Service: Cardiovascular;  Laterality: N/A;  . OOPHORECTOMY      Family History  Problem Relation Age of Onset  . Heart failure Father   . Arrhythmia Father   . Hypertension Father   . Arrhythmia Sister   . Hypertension Sister   .  Congestive Heart Failure Brother   . Hypertension Brother   . Cardiomyopathy Sister   . Diabetes Brother   . Breast cancer Paternal Aunt 73    Social History   Socioeconomic History  . Marital status: Married    Spouse name: Alyssa Lawson  . Number of children: 1  . Years of education: some college  . Highest education level: 12th grade  Occupational History  . Occupation: Retired  Scientific laboratory technician  . Financial resource strain: Not  hard at all  . Food insecurity    Worry: Never true    Inability: Never true  . Transportation needs    Medical: No    Non-medical: No  Tobacco Use  . Smoking status: Former Smoker    Packs/day: 0.50    Years: 18.00    Pack years: 9.00    Types: Cigarettes    Start date: 06/23/1956    Quit date: 11/22/1995    Years since quitting: 23.2  . Smokeless tobacco: Former Systems developer    Quit date: 11/22/1996  . Tobacco comment: smoking cessation materials not required  Substance and Sexual Activity  . Alcohol use: Yes    Alcohol/week: 0.0 standard drinks    Comment: socially  . Drug use: No  . Sexual activity: Yes    Partners: Male  Lifestyle  . Physical activity    Days per week: 7 days    Minutes per session: 10 min  . Stress: Not at all  Relationships  . Social Herbalist on phone: Patient refused    Gets together: Patient refused    Attends religious service: Patient refused    Active member of club or organization: Patient refused    Attends meetings of clubs or organizations: Patient refused    Relationship status: Married  . Intimate partner violence    Fear of current or ex partner: No    Emotionally abused: No    Physically abused: No    Forced sexual activity: No  Other Topics Concern  . 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  acetaminophen (TYLENOL) 500 MG tablet, Take 1 tablet (500 mg total) by mouth every 6 (six) hours as needed for headache., Disp: 30 tablet, Rfl: 0 .  aspirin 81 MG chewable tablet, Chew 1 tablet by mouth daily., Disp: , Rfl:  .  atorvastatin (LIPITOR) 40 MG tablet, Take 1 tablet (40 mg total) by mouth daily., Disp: 90 tablet, Rfl: 1 .  carvedilol (COREG) 25 MG tablet, Take 1 tablet (25 mg total) by mouth 2 (two) times daily with a meal., Disp:  180 tablet, Rfl: 1 .  cloNIDine (CATAPRES) 0.1 MG tablet, TAKE 1 TABLET BY MOUTH  EVERY EVENING, Disp: 90 tablet, Rfl: 0 .  diclofenac sodium (VOLTAREN) 1 % GEL, diclofenac 1 % topical gel, Disp: , Rfl:  .  ferrous sulfate 325 (65 FE) MG tablet, Take 325 mg by mouth daily with breakfast., Disp: , Rfl:  .  fluticasone (FLONASE) 50 MCG/ACT nasal spray, USE 1 SPRAY IN EACH NOSTRIL TWO TIMES DAILY, Disp: 48 g, Rfl: 1 .  hydrALAZINE (APRESOLINE) 50 MG tablet, Take 1 tablet (50 mg total) by mouth 4 (four) times daily., Disp: 360 tablet, Rfl: 1 .  isosorbide mononitrate (IMDUR) 30 MG 24  hr tablet, TAKE 1 TABLET BY MOUTH  DAILY, Disp: 90 tablet, Rfl: 3 .  Magnesium Oxide 400 (240 MG) MG TABS, Take 1 tablet by mouth daily., Disp: , Rfl:  .  methocarbamol (ROBAXIN) 500 MG tablet, Take 1 tablet (500 mg total) by mouth at bedtime as needed for muscle spasms., Disp: 90 tablet, Rfl: 1 .  Multiple Vitamin (MULTIVITAMIN) capsule, Take 1 capsule by mouth daily., Disp: , Rfl:  .  olmesartan (BENICAR) 40 MG tablet, TAKE 1 TABLET BY MOUTH  DAILY, Disp: 90 tablet, Rfl: 3 .  oxybutynin (DITROPAN) 5 MG tablet, Take 1 tablet (5 mg total) by mouth 2 (two) times daily., Disp: 180 tablet, Rfl: 1 .  rOPINIRole (REQUIP) 2 MG tablet, Take 1 tablet (2 mg total) by mouth at bedtime., Disp: 90 tablet, Rfl: 1 .  valACYclovir (VALTREX) 500 MG tablet, TAKE 1 TABLET BY MOUTH 2  TIMES DAILY. PER EPISODE AS NEEDED, TAKE FOR 10 DAYS (Patient taking differently: TAKE 1 TABLET BY MOUTH 2  TIMES DAILY AS NEEDED. PER EPISODE AS NEEDED, TAKE FOR 10 DAYS), Disp: 90 tablet, Rfl: 0  Allergies  Allergen Reactions  . Amlodipine Swelling    I personally reviewed active problem list, medication list, allergies, family history with the patient/caregiver today.   ROS  Ten systems reviewed and is negative except as mentioned in HPI   Objective  Virtual encounter, vitals obtained at home .  Today's Vitals   03/02/19 1309  BP: 122/77   Pulse: (!) 58  Temp: (!) 96.9 F (36.1 C)   There is no height or weight on file to calculate BMI.  Physical Exam  Awake, alert and oriented, no distress  PHQ2/9: Depression screen [facility] 2/9 03/02/2019 01/24/2019 10/22/2018 04/21/2018 03/16/2018  Decreased Interest 0 0 0 0 0  Down, Depressed, Hopeless 0 0 0 0 0  PHQ - 2 Score 0 0 0 0 0  Altered sleeping 0 0 0 0 0  Tired, decreased energy 0 0 0 0 0  Change in appetite 0 0 0 0 0  Feeling bad or failure about yourself  0 0 0 0 0  Trouble concentrating 0 0 0 0 0  Moving slowly or fidgety/restless 0 0 0 0 0  Suicidal thoughts 0 0 0 0 0  PHQ-9 Score 0 0 0 0 0  Difficult doing work/chores - - - Not difficult at all Not difficult at all   PHQ-2/9 Result is negative.    Fall Risk: Fall Risk  03/02/2019 01/24/2019 10/22/2018 04/21/2018 03/16/2018  Falls in the past year? 0 0 0 Yes Yes  Number falls in past yr: 0 0 0 1 1  Injury with Fall? 0 0 0 Yes No  Comment - - - right knee -  Risk for fall due to : - - - - Impaired vision  Risk for fall due to: Comment - - - - wears eyeglasses  Follow up - - - - Falls evaluation completed;Education provided;Falls prevention discussed     Assessment & Plan  1. Chills  - Novel Coronavirus, NAA ([facility]); Future  2. Fatigue, unspecified type  - Novel Coronavirus, NAA ([facility]); Future  3. Sore throat  - Novel Coronavirus, NAA ([facility]); Future  I discussed the assessment and treatment plan with the patient. The patient was provided an opportunity to ask questions and all were answered. The patient agreed with the plan and demonstrated an understanding of the instructions.  The patient was advised to call back or seek  an in-person evaluation if the symptoms worsen or if the condition fails to improve as anticipated.  I provided 15  minutes of non-face-to-face time during this encounter.

## 2019-03-03 ENCOUNTER — Other Ambulatory Visit: Payer: Self-pay

## 2019-03-03 DIAGNOSIS — R6889 Other general symptoms and signs: Secondary | ICD-10-CM | POA: Diagnosis not present

## 2019-03-03 DIAGNOSIS — Z20822 Contact with and (suspected) exposure to covid-19: Secondary | ICD-10-CM

## 2019-03-04 LAB — NOVEL CORONAVIRUS, NAA: SARS-CoV-2, NAA: NOT DETECTED

## 2019-03-08 ENCOUNTER — Other Ambulatory Visit: Payer: Self-pay | Admitting: Family Medicine

## 2019-03-08 DIAGNOSIS — N3941 Urge incontinence: Secondary | ICD-10-CM

## 2019-03-08 DIAGNOSIS — I10 Essential (primary) hypertension: Secondary | ICD-10-CM

## 2019-03-08 DIAGNOSIS — G2581 Restless legs syndrome: Secondary | ICD-10-CM

## 2019-03-09 NOTE — Telephone Encounter (Signed)
Requested medication (s) are due for refill today: yes  Requested medication (s) are on the active medication list: yes  Last refill:  01/12/2019  Future visit scheduled: yes  Notes to clinic: requesting 1 year supply   Requested Prescriptions  Pending Prescriptions Disp Refills   carvedilol (COREG) 25 MG tablet [Pharmacy Med Name: CARVEDILOL  25MG   TAB] 180 tablet 3    Sig: TAKE 1 TABLET BY MOUTH  TWICE DAILY WITH MEALS     Cardiovascular:  Beta Blockers Passed - 03/08/2019  9:13 PM      Passed - Last BP in normal range    BP Readings from Last 1 Encounters:  03/02/19 122/77         Passed - Last Heart Rate in normal range    Pulse Readings from Last 1 Encounters:  03/02/19 (!) 58         Passed - Valid encounter within last 6 months    Recent Outpatient Visits          1 week ago [facility] [place], Drue Stager, MD   1 month ago Dyslipidemia associated with type 2 diabetes mellitus [facility])   [facility] Steele Sizer, MD   4 months ago Benign essential HTN   [facility] [place], Drue Stager, MD   10 months ago Unstable angina [facility])   [facility] Steele Sizer, MD   1 year ago Benign essential HTN   [place], FNP      Future Appointments            In 1 week [facility], [facility]             rOPINIRole (REQUIP) 2 MG tablet [Pharmacy Med Name: ROPINIROLE  2MG   TAB] 90 tablet 3    Sig: TAKE 1 TABLET BY MOUTH AT  BEDTIME     Neurology:  Parkinsonian Agents Passed - 03/08/2019  9:13 PM      Passed - Last BP in normal range    BP Readings from Last 1 Encounters:  03/02/19 122/77         Passed - Valid encounter within last 12 months    Recent Outpatient Visits          1 week ago [facility] [place], Drue Stager, MD   1 month ago Dyslipidemia associated with type 2 diabetes mellitus [facility])   [facility] Steele Sizer, MD   4 months ago Benign essential HTN   [facility] [place], Drue Stager, MD   10 months ago Unstable angina [facility])   [facility] Steele Sizer, MD   1 year ago Benign essential HTN   [place], FNP      Future Appointments            In 1 week [facility], [facility]             oxybutynin (DITROPAN) 5 MG tablet [Pharmacy Med Name: OXYBUTYNIN  5MG   TAB] 180 tablet 3    Sig: TAKE 1 TABLET BY MOUTH  TWICE DAILY     Urology:  Bladder Agents Passed - 03/08/2019  9:13 PM      Passed - Valid encounter within last 12 months    Recent Outpatient Visits          1 week ago [place]  [facility] [place], Drue Stager, MD   1 month ago Dyslipidemia associated with type 2 diabetes mellitus [facility])   [facility] Steele Sizer, MD   4 months ago Benign essential HTN   [facility] Steele Sizer, MD   10 months ago Unstable angina [facility])   [facility] Steele Sizer, MD   1 year ago Benign essential HTN   [place], FNP      Future Appointments            In 1 week [facility], [facility]

## 2019-03-14 ENCOUNTER — Encounter: Payer: Self-pay | Admitting: Family Medicine

## 2019-03-14 NOTE — Telephone Encounter (Signed)
Copied from [place] [id number]. Topic: General - Inquiry >> Mar 14, 2019 10:24 AM Alyssa Lawson, NT wrote: Reason for CRM: Patient called in stating friend of hers tested positive for COVID 19, and she did have direct contact with her last Monday. Friend got tested and results came back last night. Patient is wondering if she and her husband should go get tested again. Please advise and call back is [phone].

## 2019-03-18 ENCOUNTER — Ambulatory Visit (INDEPENDENT_AMBULATORY_CARE_PROVIDER_SITE_OTHER): Payer: Medicare Other

## 2019-03-18 VITALS — BP 107/74 | HR 74 | Temp 98.2°F | Ht 65.0 in | Wt 172.0 lb

## 2019-03-18 DIAGNOSIS — Z0000 Encounter for general adult medical examination without abnormal findings: Secondary | ICD-10-CM

## 2019-03-18 NOTE — Patient Instructions (Signed)
Alyssa Lawson , Thank you for taking time to come for your Medicare Wellness Visit. I appreciate your ongoing commitment to your health goals. Please review the following plan we discussed and let me know if I can assist you in the future.   Screening recommendations/referrals: Colonoscopy: done 02/10/13 Mammogram: done 08/04/18 Bone Density: done 08/04/18 Recommended yearly ophthalmology/optometry visit for glaucoma screening and checkup Recommended yearly dental visit for hygiene and checkup  Vaccinations: Influenza vaccine: done 04/21/18 Pneumococcal vaccine: done 12/21/14 Tdap vaccine: done 01/20/18 Shingles vaccine: Shingrix discussed. Please contact your pharmacy for coverage information.   Advanced directives: Advance directive discussed with you today. I have provided a copy for you to complete at home and have notarized. Once this is complete please bring a copy in to our office so we can scan it into your chart.  Conditions/risks identified: Keep up the great work!  Next appointment: Please follow up in one year for your Medicare Annual Wellness visit.     Preventive Care 55 Years and Older, Female Preventive care refers to lifestyle choices and visits with your health care provider that can promote health and wellness. What does preventive care include?  A yearly physical exam. This is also called an annual well check.  Dental exams once or twice a year.  Routine eye exams. Ask your health care provider how often you should have your eyes checked.  Personal lifestyle choices, including:  Daily care of your teeth and gums.  Regular physical activity.  Eating a healthy diet.  Avoiding tobacco and drug use.  Limiting alcohol use.  Practicing safe sex.  Taking low-dose aspirin every day.  Taking vitamin and mineral supplements as recommended by your health care provider. What happens during an annual well check? The services and screenings done by your health care  provider during your annual well check will depend on your age, overall health, lifestyle risk factors, and family history of disease. Counseling  Your health care provider may ask you questions about your:  Alcohol use.  Tobacco use.  Drug use.  Emotional well-being.  Home and relationship well-being.  Sexual activity.  Eating habits.  History of falls.  Memory and ability to understand (cognition).  Work and work Statistician.  Reproductive health. Screening  You may have the following tests or measurements:  Height, weight, and BMI.  Blood pressure.  Lipid and cholesterol levels. These may be checked every 5 years, or more frequently if you are over 30 years old.  Skin check.  Lung cancer screening. You may have this screening every year starting at age 80 if you have a 30-pack-year history of smoking and currently smoke or have quit within the past 15 years.  Fecal occult blood test (FOBT) of the stool. You may have this test every year starting at age 17.  Flexible sigmoidoscopy or colonoscopy. You may have a sigmoidoscopy every 5 years or a colonoscopy every 10 years starting at age 74.  Hepatitis C blood test.  Hepatitis B blood test.  Sexually transmitted disease (STD) testing.  Diabetes screening. This is done by checking your blood sugar (glucose) after you have not eaten for a while (fasting). You may have this done every 1-3 years.  Bone density scan. This is done to screen for osteoporosis. You may have this done starting at age 25.  Mammogram. This may be done every 1-2 years. Talk to your health care provider about how often you should have regular mammograms. Talk with your health care provider  about your test results, treatment options, and if necessary, the need for more tests. Vaccines  Your health care provider may recommend certain vaccines, such as:  Influenza vaccine. This is recommended every year.  Tetanus, diphtheria, and acellular  pertussis (Tdap, Td) vaccine. You may need a Td booster every 10 years.  Zoster vaccine. You may need this after age 18.  Pneumococcal 13-valent conjugate (PCV13) vaccine. One dose is recommended after age 64.  Pneumococcal polysaccharide (PPSV23) vaccine. One dose is recommended after age 54. Talk to your health care provider about which screenings and vaccines you need and how often you need them. This information is not intended to replace advice given to you by your health care provider. Make sure you discuss any questions you have with your health care provider. Document Released: 07/06/2015 Document Revised: 02/27/2016 Document Reviewed: 04/10/2015 Elsevier Interactive Patient Education  2017 [place] Prevention in the Home Falls can cause injuries. They can happen to people of all ages. There are many things you can do to make your home safe and to help prevent falls. What can I do on the outside of my home?  Regularly fix the edges of walkways and driveways and fix any cracks.  Remove anything that might make you trip as you walk through a door, such as a raised step or threshold.  Trim any bushes or trees on the path to your home.  Use bright outdoor lighting.  Clear any walking paths of anything that might make someone trip, such as rocks or tools.  Regularly check to see if handrails are loose or broken. Make sure that both sides of any steps have handrails.  Any raised decks and porches should have guardrails on the edges.  Have any leaves, snow, or ice cleared regularly.  Use sand or salt on walking paths during winter.  Clean up any spills in your garage right away. This includes oil or grease spills. What can I do in the bathroom?  Use night lights.  Install grab bars by the toilet and in the tub and shower. Do not use towel bars as grab bars.  Use non-skid mats or decals in the tub or shower.  If you need to sit down in the shower, use a plastic,  non-slip stool.  Keep the floor dry. Clean up any water that spills on the floor as soon as it happens.  Remove soap buildup in the tub or shower regularly.  Attach bath mats securely with double-sided non-slip rug tape.  Do not have throw rugs and other things on the floor that can make you trip. What can I do in the bedroom?  Use night lights.  Make sure that you have a light by your bed that is easy to reach.  Do not use any sheets or blankets that are too big for your bed. They should not hang down onto the floor.  Have a firm chair that has side arms. You can use this for support while you get dressed.  Do not have throw rugs and other things on the floor that can make you trip. What can I do in the kitchen?  Clean up any spills right away.  Avoid walking on wet floors.  Keep items that you use a lot in easy-to-reach places.  If you need to reach something above you, use a strong step stool that has a grab bar.  Keep electrical cords out of the way.  Do not use floor polish or  wax that makes floors slippery. If you must use wax, use non-skid floor wax.  Do not have throw rugs and other things on the floor that can make you trip. What can I do with my stairs?  Do not leave any items on the stairs.  Make sure that there are handrails on both sides of the stairs and use them. Fix handrails that are broken or loose. Make sure that handrails are as long as the stairways.  Check any carpeting to make sure that it is firmly attached to the stairs. Fix any carpet that is loose or worn.  Avoid having throw rugs at the top or bottom of the stairs. If you do have throw rugs, attach them to the floor with carpet tape.  Make sure that you have a light switch at the top of the stairs and the bottom of the stairs. If you do not have them, ask someone to add them for you. What else can I do to help prevent falls?  Wear shoes that:  Do not have high heels.  Have rubber  bottoms.  Are comfortable and fit you well.  Are closed at the toe. Do not wear sandals.  If you use a stepladder:  Make sure that it is fully opened. Do not climb a closed stepladder.  Make sure that both sides of the stepladder are locked into place.  Ask someone to hold it for you, if possible.  Clearly mark and make sure that you can see:  Any grab bars or handrails.  First and last steps.  Where the edge of each step is.  Use tools that help you move around (mobility aids) if they are needed. These include:  Canes.  Walkers.  Scooters.  Crutches.  Turn on the lights when you go into a dark area. Replace any light bulbs as soon as they burn out.  Set up your furniture so you have a clear path. Avoid moving your furniture around.  If any of your floors are uneven, fix them.  If there are any pets around you, be aware of where they are.  Review your medicines with your doctor. Some medicines can make you feel dizzy. This can increase your chance of falling. Ask your doctor what other things that you can do to help prevent falls. This information is not intended to replace advice given to you by your health care provider. Make sure you discuss any questions you have with your health care provider. Document Released: 04/05/2009 Document Revised: 11/15/2015 Document Reviewed: 07/14/2014 Elsevier Interactive Patient Education  2017 [employer].

## 2019-03-18 NOTE — Progress Notes (Signed)
Subjective:   Alyssa Lawson is a 73 y.o. female who presents for Medicare Annual (Subsequent) preventive examination.  Virtual Visit via Telephone Note  I connected with Alyssa Lawson on 03/18/19 at 10:40 AM EDT by telephone and verified that I am speaking with the correct person using two identifiers.  Medicare Annual Wellness visit completed telephonically due to Covid-19 pandemic.   Location: Patient: home Provider: office   I discussed the limitations, risks, security and privacy concerns of performing an evaluation and management service by telephone and the availability of in person appointments. The patient expressed understanding and agreed to proceed.  Some vital signs may be absent or patient reported.   Clemetine Marker, LPN    Review of Systems:   Cardiac Risk Factors include: advanced age (>61men, >36 women);hypertension;dyslipidemia;diabetes mellitus     Objective:     Vitals: BP 107/74   Pulse 74   Temp 98.2 F (36.8 C)   Ht 5\' 5"  (1.651 m)   Wt 172 lb (78 kg)   BMI 28.62 kg/m   Body mass index is 28.62 kg/m.  Advanced Directives 03/18/2019 06/05/2018 03/16/2018 05/01/2017 04/10/2017 01/07/2017 08/20/2016  Does Patient Have a Medical Advance Directive? No Yes Yes Yes Yes Yes Yes  Type of Advance Directive - [place];Living will [place];Living will [place];Living will [place];Living will [place];Living will [place];Living will  Does patient want to make changes to medical advance directive? - - - No - Patient declined - - -  Copy of [place] in Chart? - - No - copy requested No - copy requested Yes No - copy requested -  Would patient like information on creating a medical advance directive? Yes (MAU/Ambulatory/Procedural Areas - Information given) - - - - - -    Tobacco Social History   Tobacco Use  Smoking  Status Former Smoker  . Packs/day: 0.50  . Years: 18.00  . Pack years: 9.00  . Types: Cigarettes  . Start date: 06/23/1956  . Quit date: 11/22/1995  . Years since quitting: 23.3  Smokeless Tobacco Former Systems developer  . Quit date: 11/22/1996  Tobacco Comment   smoking cessation materials not required     Counseling given: Not Answered Comment: smoking cessation materials not required   Clinical Intake:  Pre-visit preparation completed: Yes  Pain : No/denies pain     BMI - recorded: 28.62 Nutritional Status: BMI 25 -29 Overweight Nutritional Risks: None Diabetes: No CBG done?: No Did pt. bring in CBG monitor from home?: No  How often do you need to have someone help you when you read instructions, pamphlets, or other written materials from your doctor or pharmacy?: 1 - Never  Interpreter Needed?: No  Information entered by :: Clemetine Marker LPN  Past Medical History:  Diagnosis Date  . Bladder disorder    a. urinary incontinence  . Cancer ([place])    cervical ca  . Coronary artery disease, non-occlusive    a. LHC 05/01/2017: LM no sig dz, LAD no sig dz, LCx no sig dz, RCA no sig dz, EF 55-65%, mod MR  . Endometriosis   . GERD (gastroesophageal reflux disease)   . Hyperlipidemia   . Hypertension   . Mitral regurgitation    a. TTE 05/01/2017: EF 55-60%, no RWMA, not technically sufficient to allow for LV diastolic function, mild to moderate mitral regurgitation, mildly dilated left atrium, RV systolic function  normal, PASP 35 mmHg, small to moderate pericardial effusion was noted  . Restless leg syndrome    Past Surgical History:  Procedure Laterality Date  . ABDOMINAL HYSTERECTOMY  1976  . LEFT HEART CATH AND CORONARY ANGIOGRAPHY N/A 05/01/2017   Procedure: LEFT HEART CATH AND CORONARY ANGIOGRAPHY;  Surgeon: Minna Merritts, MD;  Location: [facility];  Service: Cardiovascular;  Laterality: N/A;  . OOPHORECTOMY     Family History  Problem Relation Age of Onset  .  Heart failure Father   . Arrhythmia Father   . Hypertension Father   . Arrhythmia Sister   . Hypertension Sister   . Congestive Heart Failure Brother   . Hypertension Brother   . Cardiomyopathy Sister   . Diabetes Brother   . Breast cancer Paternal Aunt 30   Social History   Socioeconomic History  . Marital status: Married    Spouse name: Doren Custard  . Number of children: 1  . Years of education: some college  . Highest education level: 12th grade  Occupational History  . Occupation: Retired  Scientific laboratory technician  . Financial resource strain: Not hard at all  . Food insecurity    Worry: Never true    Inability: Never true  . Transportation needs    Medical: No    Non-medical: No  Tobacco Use  . Smoking status: Former Smoker    Packs/day: 0.50    Years: 18.00    Pack years: 9.00    Types: Cigarettes    Start date: 06/23/1956    Quit date: 11/22/1995    Years since quitting: 23.3  . Smokeless tobacco: Former Systems developer    Quit date: 11/22/1996  . Tobacco comment: smoking cessation materials not required  Substance and Sexual Activity  . Alcohol use: Yes    Alcohol/week: 0.0 standard drinks    Comment: socially  . Drug use: No  . Sexual activity: Yes    Partners: Male  Lifestyle  . Physical activity    Days per week: 7 days    Minutes per session: 20 min  . Stress: Not at all  Relationships  . Social Herbalist on phone: Patient refused    Gets together: Patient refused    Attends religious service: Patient refused    Active member of club or organization: Patient refused    Attends meetings of clubs or organizations: Patient refused    Relationship status: Married  Other Topics Concern  . 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  acetaminophen (TYLENOL) 500 MG tablet Take 1 tablet (500 mg total) by mouth every 6 (six) hours as needed for headache.  Marland Kitchen aspirin 81 MG chewable tablet Chew 1 tablet by mouth daily.  Marland Kitchen atorvastatin (LIPITOR) 40 MG tablet Take 1 tablet (40 mg total) by mouth daily.  . B Complex-C-Folic Acid (STRESS XX123456 B-COMPLEX PO) Take by mouth.  . carvedilol (COREG) 25 MG tablet TAKE 1 TABLET BY MOUTH  TWICE DAILY WITH MEALS  . cloNIDine (CATAPRES) 0.1 MG tablet TAKE 1 TABLET BY MOUTH  EVERY EVENING  . Cyanocobalamin (VITAMIN B-12) 2000 MCG TBCR Take by mouth.  . ferrous sulfate 325 (65 FE) MG tablet Take 325 mg by mouth daily with breakfast.  .  fluticasone (FLONASE) 50 MCG/ACT nasal spray USE 1 SPRAY IN EACH NOSTRIL TWO TIMES DAILY  . hydrALAZINE (APRESOLINE) 50 MG tablet Take 1 tablet (50 mg total) by mouth 4 (four) times daily.  . isosorbide mononitrate (IMDUR) 30 MG 24 hr tablet TAKE 1 TABLET BY MOUTH  DAILY  . Magnesium Oxide 400 (240 MG) MG TABS Take 1 tablet by mouth daily.  . methocarbamol (ROBAXIN) 500 MG tablet Take 1 tablet (500 mg total) by mouth at bedtime as needed for muscle spasms.  . Multiple Vitamin (MULTIVITAMIN) capsule Take 1 capsule by mouth daily.  Marland Kitchen olmesartan (BENICAR) 40 MG tablet TAKE 1 TABLET BY MOUTH  DAILY  . oxybutynin (DITROPAN) 5 MG tablet TAKE 1 TABLET BY MOUTH  TWICE DAILY  . rOPINIRole (REQUIP) 2 MG tablet TAKE 1 TABLET BY MOUTH AT  BEDTIME  . SUPER B COMPLEX/C PO Take by mouth.  . valACYclovir (VALTREX) 500 MG tablet TAKE 1 TABLET BY MOUTH 2  TIMES DAILY. PER EPISODE AS NEEDED, TAKE FOR 10 DAYS (Patient taking differently: TAKE 1 TABLET BY MOUTH 2  TIMES DAILY AS NEEDED. PER EPISODE AS NEEDED, TAKE FOR 10 DAYS)  . vitamin C (ASCORBIC ACID) 500 MG tablet Take 500 mg by mouth daily.  . [DISCONTINUED] diclofenac sodium (VOLTAREN) 1 % GEL diclofenac 1 % topical gel   No facility-administered encounter medications on file as of 03/18/2019.     Activities of Daily Living In  your present state of health, do you have any difficulty performing the following activities: 03/18/2019 01/24/2019  Hearing? N N  Comment declines hearing aids -  Vision? N N  Comment - -  Difficulty concentrating or making decisions? N N  Walking or climbing stairs? N N  Dressing or bathing? N N  Doing errands, shopping? N N  Preparing Food and eating ? N -  Using the Toilet? N -  In the past six months, have you accidently leaked urine? N -  Do you have problems with loss of bowel control? N -  Managing your Medications? N -  Managing your Finances? N -  Housekeeping or managing your Housekeeping? N -  Some recent data might be hidden    Patient Care Team: Steele Sizer, MD as PCP - General (Family Medicine) Thornton Park, MD as Consulting Physician (Orthopedic Surgery) Minna Merritts, MD as Consulting Physician (Cardiology) Rutherford Limerick, MD as Consulting Physician (Orthopedic Surgery)    Assessment:   This is a routine wellness examination for [facility].  Exercise Activities and Dietary recommendations Current Exercise Habits: Home exercise routine, Type of exercise: Other - see comments(working in her yard), Time (Minutes): 20, Frequency (Times/Week): 7, Weekly Exercise (Minutes/Week): 140, Intensity: Mild, Exercise limited by: None identified  Goals    . DIET - REDUCE SUGAR INTAKE     Recommend to eliminate sugar intake and to eat 3 small healthy meals and at least 2 healthy snacks per day.       Fall Risk Fall Risk  03/18/2019 03/02/2019 01/24/2019 10/22/2018 04/21/2018  Falls in the past year? 0 0 0 0 Yes  Number falls in past yr: 0 0 0 0 1  Injury with Fall? 0 0 0 0 Yes  Comment - - - - right knee  Risk for fall due to : - - - - -  Risk for fall due to: Comment - - - - -  Follow up Falls prevention discussed - - - -   FALL RISK PREVENTION PERTAINING TO  THE HOME:  Any stairs in or around the home? Yes  If so, do they handrails? Yes   Home free of  loose throw rugs in walkways, pet beds, electrical cords, etc? Yes  Adequate lighting in your home to reduce risk of falls? Yes   ASSISTIVE DEVICES UTILIZED TO PREVENT FALLS:  Life alert? No  Use of a cane, walker or w/c? No  Grab bars in the bathroom? Yes  Shower chair or bench in shower? No  Elevated toilet seat or a handicapped toilet? No   DME ORDERS:  DME order needed?  No   TIMED UP AND GO:  Was the test performed? No . Telephonic visit.   Education: Fall risk prevention has been discussed.  Intervention(s) required? No   Depression Screen PHQ 2/9 Scores 03/18/2019 03/02/2019 01/24/2019 10/22/2018  PHQ - 2 Score 0 0 0 0  PHQ- 9 Score - 0 0 0     Cognitive Function     6CIT Screen 03/18/2019 03/16/2018  What Year? 0 points 0 points  What month? 0 points 0 points  What time? 0 points 0 points  Count back from 20 0 points 0 points  Months in reverse 0 points 0 points  Repeat phrase 0 points 4 points  Total Score 0 4    Immunization History  Administered Date(s) Administered  . Influenza Split 04/27/2007, 04/19/2009  . Influenza, High Dose Seasonal PF 06/22/2015, 06/06/2016, 02/25/2017, 04/21/2018  . Influenza,inj,Quad PF,6+ Mos 03/15/2013, 06/19/2014  . Pneumococcal Conjugate-13 12/21/2014  . Pneumococcal Polysaccharide-23 04/27/2007, 09/14/2012  . Td 11/02/2007  . Tdap 01/20/2018  . Zoster 07/12/2013    Qualifies for Shingles Vaccine? Yes  Zostavax completed 2015. Due for Shingrix. Education has been provided regarding the importance of this vaccine. Pt has been advised to call insurance company to determine out of pocket expense. Advised may also receive vaccine at local pharmacy or Health Dept. Verbalized acceptance and understanding.  Tdap: Up to date  Flu Vaccine: Due for Flu vaccine. Does the patient want to receive this vaccine today?  No . Education has been provided regarding the importance of this vaccine but still declined. Advised may receive this  vaccine at local pharmacy or Health Dept. Aware to provide a copy of the vaccination record if obtained from local pharmacy or Health Dept. Verbalized acceptance and understanding.  Pneumococcal Vaccine: Up to date   Screening Tests Health Maintenance  Topic Date Due  . INFLUENZA VACCINE  01/22/2019  . HEMOGLOBIN A1C  07/27/2019  . MAMMOGRAM  08/05/2019  . FOOT EXAM  01/24/2020  . OPHTHALMOLOGY EXAM  02/17/2020  . COLONOSCOPY  02/11/2023  . TETANUS/TDAP  01/21/2028  . DEXA SCAN  Completed  . Hepatitis C Screening  Completed  . PNA vac Low Risk Adult  Completed    Cancer Screenings:  Colorectal Screening: Completed 02/10/13. Repeat every 10 years.  Mammogram: Completed 08/04/18. Repeat every year.  Bone Density: Completed 08/04/18. Results reflect NORMAL. Repeat every 2 years.   Lung Cancer Screening: (Low Dose CT Chest recommended if Age 3-80 years, 30 pack-year currently smoking OR have quit w/in 15years.) does not qualify.    Additional Screening:  Hepatitis C Screening: does qualify; Completed 09/14/12  Vision Screening: Recommended annual ophthalmology exams for early detection of glaucoma and other disorders of the eye. Is the patient up to date with their annual eye exam?  Yes  Who is the provider or what is the name of the office in which the pt attends  annual eye exams? [facility]  Dental Screening: Recommended annual dental exams for proper oral hygiene  Community Resource Referral:  CRR required this visit?  No       Plan:     I have personally reviewed and addressed the Medicare Annual Wellness questionnaire and have noted the following in the patient's chart:  A. Medical and social history B. Use of alcohol, tobacco or illicit drugs  C. Current medications and supplements D. Functional ability and status E.  Nutritional status F.  Physical activity G. Advance directives H. List of other physicians I.  Hospitalizations, surgeries, and ER visits in  previous 12 months J.  [place] such as hearing and vision if needed, cognitive and depression L. Referrals and appointments   In addition, I have reviewed and discussed with patient certain preventive protocols, quality metrics, and best practice recommendations. A written personalized care plan for preventive services as well as general preventive health recommendations were provided to patient.   Signed,  Clemetine Marker, LPN Nurse Health Advisor   Nurse Notes: pt states since start of Covid she is trying to boost her immune system and taking vitamin b12 2551mcg, super B complex and stress B. Please advise patient if is too much. Telephonic visit so I was unable to see total mcg/mg she is taking of which type of B. Thank you.

## 2019-03-25 ENCOUNTER — Encounter: Payer: Self-pay | Admitting: Family Medicine

## 2019-03-28 ENCOUNTER — Other Ambulatory Visit: Payer: Self-pay

## 2019-03-28 DIAGNOSIS — Z20822 Contact with and (suspected) exposure to covid-19: Secondary | ICD-10-CM

## 2019-03-28 DIAGNOSIS — Z20828 Contact with and (suspected) exposure to other viral communicable diseases: Secondary | ICD-10-CM

## 2019-03-29 LAB — NOVEL CORONAVIRUS, NAA: SARS-CoV-2, NAA: NOT DETECTED

## 2019-04-11 ENCOUNTER — Other Ambulatory Visit: Payer: Self-pay

## 2019-04-11 DIAGNOSIS — Z20822 Contact with and (suspected) exposure to covid-19: Secondary | ICD-10-CM

## 2019-04-13 LAB — NOVEL CORONAVIRUS, NAA: SARS-CoV-2, NAA: NOT DETECTED

## 2019-04-14 ENCOUNTER — Encounter: Payer: Self-pay | Admitting: Family Medicine

## 2019-04-27 ENCOUNTER — Other Ambulatory Visit: Payer: Self-pay | Admitting: Family Medicine

## 2019-04-27 DIAGNOSIS — E785 Hyperlipidemia, unspecified: Secondary | ICD-10-CM

## 2019-05-10 ENCOUNTER — Ambulatory Visit (INDEPENDENT_AMBULATORY_CARE_PROVIDER_SITE_OTHER): Payer: Medicare Other

## 2019-05-10 ENCOUNTER — Other Ambulatory Visit: Payer: Self-pay

## 2019-05-10 DIAGNOSIS — Z23 Encounter for immunization: Secondary | ICD-10-CM

## 2019-05-27 ENCOUNTER — Encounter: Payer: Self-pay | Admitting: Family Medicine

## 2019-05-31 ENCOUNTER — Other Ambulatory Visit: Payer: Self-pay | Admitting: Family Medicine

## 2019-05-31 DIAGNOSIS — I10 Essential (primary) hypertension: Secondary | ICD-10-CM

## 2019-06-03 ENCOUNTER — Other Ambulatory Visit: Payer: Self-pay | Admitting: Family Medicine

## 2019-06-03 ENCOUNTER — Encounter: Payer: Self-pay | Admitting: Family Medicine

## 2019-06-03 ENCOUNTER — Ambulatory Visit: Payer: Medicare Other | Admitting: Family Medicine

## 2019-06-03 DIAGNOSIS — I10 Essential (primary) hypertension: Secondary | ICD-10-CM

## 2019-06-08 ENCOUNTER — Ambulatory Visit (INDEPENDENT_AMBULATORY_CARE_PROVIDER_SITE_OTHER): Payer: Medicare Other | Admitting: Family Medicine

## 2019-06-08 ENCOUNTER — Other Ambulatory Visit: Payer: Self-pay

## 2019-06-08 ENCOUNTER — Encounter: Payer: Self-pay | Admitting: Family Medicine

## 2019-06-08 VITALS — BP 121/75 | HR 64 | Temp 98.4°F | Wt 170.0 lb

## 2019-06-08 DIAGNOSIS — M545 Low back pain, unspecified: Secondary | ICD-10-CM

## 2019-06-08 DIAGNOSIS — E1169 Type 2 diabetes mellitus with other specified complication: Secondary | ICD-10-CM

## 2019-06-08 DIAGNOSIS — I10 Essential (primary) hypertension: Secondary | ICD-10-CM | POA: Diagnosis not present

## 2019-06-08 DIAGNOSIS — E785 Hyperlipidemia, unspecified: Secondary | ICD-10-CM

## 2019-06-08 DIAGNOSIS — G2581 Restless legs syndrome: Secondary | ICD-10-CM

## 2019-06-08 DIAGNOSIS — R202 Paresthesia of skin: Secondary | ICD-10-CM

## 2019-06-08 DIAGNOSIS — G8929 Other chronic pain: Secondary | ICD-10-CM

## 2019-06-08 DIAGNOSIS — I200 Unstable angina: Secondary | ICD-10-CM

## 2019-06-08 DIAGNOSIS — R32 Unspecified urinary incontinence: Secondary | ICD-10-CM

## 2019-06-08 MED ORDER — MELOXICAM 15 MG PO TABS: 15.0000 mg | ORAL_TABLET | Freq: Every day | ORAL | 0 refills | Status: DC

## 2019-06-08 NOTE — Progress Notes (Signed)
Name: Alyssa Lawson   MRN: [id number]    DOB: 03-15-1946   Date:06/08/2019       Progress Note  Subjective  Chief Complaint  Chief Complaint  Patient presents with  . Tingling    Right arm tingling onset for past month    I connected with  [name]  on 06/08/19 at  9:20 AM EST by a video enabled telemedicine application and verified that I am speaking with the correct person using two identifiers.  I discussed the limitations of evaluation and management by telemedicine and the availability of in person appointments. The patient expressed understanding and agreed to proceed. Staff also discussed with the patient that there may be a patient responsible charge related to this service. Patient Location: at home Provider Location: [facility]   HPI   Right arm Tingling: she states that her right arm has been going to sleep, like needles are prickling her, always on radial aspect of right arm. She states it bothers her after she has used her right arm, like after vacuum her house, cooking, making her bed or when she first goes to bed at night. No neck pain or decrease in rom, she denies weakness. Episodes have been going on for about one month. She has a sore spot on top of right shoulder. She states during the months of October and Nov she did a lot of yard work, Games developer, lifting river rocks and is wondering if that should have caused problems. Discussed NCS and anti-inflammatories   HTN: takingcoreg 25mg  daily, Benicar 40, hydralazine 50mg  TID and clonidine qhs. BP usually between She states bp at home has been well controlled in the 120's range. No chest pain or palpitation . No dizziness  Hyperlipidemia: last lipid panel was at goal, LDL was 66 . She is taking atorvastatin and denies muscle aches   DMII: diagnosed with diabetes years ago, since 2015 A1C highest level was 6.4%, last A1C done August 2020 was 6.2%  Shestopped taking metformin due to pt c/o  swelling and weight gain with medication. Pt wastarted Trulicity back in [id number] stopped because of cost. She is now only on life style mofidication, avoids sweets and carbohydrates  She denies polyphagia, polydipsia or polyuria - she has a history of urinary incontinence but is stable   Urinary incontinence: she has been on Ditropan , tolerating medication well, nocturia at most once at night, less frequent that it used to be. She is still doing Kegel exercises   RLS: better with medication, sleeping well with medication,- Requip. No problems as long as she takes medications  Chronic Back Pain: she has daily low back pain, She takes Tylenol prn, mobicand also methocarbamol, She left her job back in May 2018 -she takes methocarbamol prn but not recently . Unchanged   Unstable angina: indigestion symptoms with abnormal EKG, normal cardiac cath done Nov 2018 by Dr. [name], on higher dose of Coreg, and Benicar also on Imdur and indigestion resolved  Also on statin therapy and aspirinNo recent episodes. She sees cardiologist once a year  GERD: she still has intermittent symptoms , she is taking Nexium prn , she takes it about 2-3 times a week  Patient Active Problem List   Diagnosis Date Noted  . Type 2 diabetes mellitus with complication, with long-term current use of insulin ([place]) 11/09/2018  . Mixed hyperlipidemia 11/09/2018  . Smoker 11/09/2018  . Atrial tachycardia ([place]) 08/25/2017  . Unstable angina ([place]) 05/01/2017  .  Positive cardiac stress test 05/01/2017  . Abnormal EKG 04/24/2017  . Smoking history 04/24/2017  . Rotator cuff tendinitis, left 04/10/2017  . Cataracts, bilateral 11/25/2016  . Impingement syndrome of shoulder, left 10/13/2016  . Chronic bilateral low back pain without sciatica 08/20/2016  . Metabolic syndrome [id number]  . Mitral regurgitation 06/22/2015  . Melasma 12/21/2014  . History of hysterectomy 12/21/2014  . Restless leg syndrome 12/21/2014   . Benign essential HTN 12/17/2014  . Cataract 12/17/2014  . Dyslipidemia 12/17/2014  . Gastric reflux 12/17/2014  . History of cervical cancer 12/17/2014  . H/O iron deficiency anemia 12/17/2014  . Urinary incontinence in female 12/17/2014  . Dysmetabolic syndrome [id number]  . TI (tricuspid incompetence) 12/17/2014  . Osteopenia of the elderly 12/17/2014    Past Surgical History:  Procedure Laterality Date  . ABDOMINAL HYSTERECTOMY  1976  . LEFT HEART CATH AND CORONARY ANGIOGRAPHY N/A 05/01/2017   Procedure: LEFT HEART CATH AND CORONARY ANGIOGRAPHY;  Surgeon: Minna Merritts, MD;  Location: [place] [facility];  Service: Cardiovascular;  Laterality: N/A;  . OOPHORECTOMY      Family History  Problem Relation Age of Onset  . Heart failure Father   . Arrhythmia Father   . Hypertension Father   . Arrhythmia Sister   . Hypertension Sister   . Congestive Heart Failure Brother   . Hypertension Brother   . Cardiomyopathy Sister   . Diabetes Brother   . Breast cancer Paternal Aunt 14    Social History   Socioeconomic History  . Marital status: Married    Spouse name: Doren Custard  . Number of children: 1  . Years of education: some college  . Highest education level: 12th grade  Occupational History  . Occupation: Retired  Tobacco Use  . Smoking status: Former Smoker    Packs/day: 0.50    Years: 18.00    Pack years: 9.00    Types: Cigarettes    Start date: 06/23/1956    Quit date: 11/22/1995    Years since quitting: 23.5  . Smokeless tobacco: Former Systems developer    Quit date: 11/22/1996  . Tobacco comment: smoking cessation materials not required  Substance and Sexual Activity  . Alcohol use: Yes    Alcohol/week: 0.0 standard drinks    Comment: socially  . Drug use: No  . Sexual activity: Yes    Partners: Male  Other Topics Concern  . 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 Difficulty of Paying Living Expenses: Not hard at all  Food Insecurity: No Food Insecurity  . Worried About Charity fundraiser in the Last Year: Never true  . Ran Out of Food in the Last Year: Never true  Transportation Needs: No Transportation Needs  . Lack of Transportation (Medical): No  . Lack of Transportation (Non-Medical): No  Physical Activity: Sufficiently Active  . Days of Exercise per Week: 7 days  . Minutes of Exercise per Session: 40 min  Stress: No Stress Concern Present  . Feeling of Stress : Not at all  Social Connections: Not Isolated  . Frequency of Communication with Friends and Family: More than three times a week  . Frequency of Social Gatherings with Friends and Family: More than three times a week  .  Attends Religious Services: More than 4 times per year  . Active Member of Clubs or Organizations: Yes  . Attends Archivist Meetings: More than 4 times per year  . Marital Status: Married  Human resources officer Violence: Not At Risk  . Fear of Current or Ex-Partner: No  . Emotionally Abused: No  . Physically Abused: No  . Sexually Abused: No     Current Outpatient Medications:  .  acetaminophen (TYLENOL) 500 MG tablet, Take 1 tablet (500 mg total) by mouth every 6 (six) hours as needed for headache., Disp: 30 tablet, Rfl: 0 .  aspirin 81 MG chewable tablet, Chew 1 tablet by mouth daily., Disp: , Rfl:  .  atorvastatin (LIPITOR) 40 MG tablet, TAKE 1 TABLET BY MOUTH  DAILY, Disp: 90 tablet, Rfl: 3 .  B Complex-C-Folic Acid (STRESS [id number] B-COMPLEX PO), Take by mouth., Disp: , Rfl:  .  carvedilol (COREG) 25 MG tablet, TAKE 1 TABLET BY MOUTH  TWICE DAILY WITH MEALS, Disp: 180 tablet, Rfl: 1 .  cloNIDine (CATAPRES) 0.1 MG tablet, TAKE 1 TABLET BY MOUTH IN  THE EVENING, Disp: 90 tablet, Rfl: 0 .  Cyanocobalamin (VITAMIN B-12) 2000 MCG TBCR, Take by  mouth., Disp: , Rfl:  .  ferrous sulfate 325 (65 FE) MG tablet, Take 325 mg by mouth daily with breakfast., Disp: , Rfl:  .  fluticasone (FLONASE) 50 MCG/ACT nasal spray, USE 1 SPRAY IN EACH NOSTRIL TWO TIMES DAILY, Disp: 48 g, Rfl: 1 .  hydrALAZINE (APRESOLINE) 50 MG tablet, TAKE 1 TABLET BY MOUTH 4  TIMES DAILY, Disp: 360 tablet, Rfl: 0 .  isosorbide mononitrate (IMDUR) 30 MG 24 hr tablet, TAKE 1 TABLET BY MOUTH  DAILY, Disp: 90 tablet, Rfl: 3 .  Magnesium Oxide 400 (240 MG) MG TABS, Take 1 tablet by mouth daily., Disp: , Rfl:  .  methocarbamol (ROBAXIN) 500 MG tablet, Take 1 tablet (500 mg total) by mouth at bedtime as needed for muscle spasms., Disp: 90 tablet, Rfl: 1 .  Multiple Vitamin (MULTIVITAMIN) capsule, Take 1 capsule by mouth daily., Disp: , Rfl:  .  olmesartan (BENICAR) 40 MG tablet, TAKE 1 TABLET BY MOUTH  DAILY, Disp: 90 tablet, Rfl: 3 .  oxybutynin (DITROPAN) 5 MG tablet, TAKE 1 TABLET BY MOUTH  TWICE DAILY, Disp: 180 tablet, Rfl: 1 .  rOPINIRole (REQUIP) 2 MG tablet, TAKE 1 TABLET BY MOUTH AT  BEDTIME, Disp: 90 tablet, Rfl: 1 .  SUPER B COMPLEX/C PO, Take by mouth., Disp: , Rfl:  .  valACYclovir (VALTREX) 500 MG tablet, TAKE 1 TABLET BY MOUTH 2  TIMES DAILY. PER EPISODE AS NEEDED, TAKE FOR 10 DAYS (Patient taking differently: TAKE 1 TABLET BY MOUTH 2  TIMES DAILY AS NEEDED. PER EPISODE AS NEEDED, TAKE FOR 10 DAYS), Disp: 90 tablet, Rfl: 0 .  vitamin C (ASCORBIC ACID) 500 MG tablet, Take 500 mg by mouth daily., Disp: , Rfl:  .  diclofenac Sodium (VOLTAREN) 1 % GEL, diclofenac 1 % topical gel, Disp: , Rfl:   Allergies  Allergen Reactions  . Amlodipine Swelling    I personally reviewed active problem list, medication list, allergies, family history, social history, health maintenance with the patient/caregiver today.   ROS  Ten systems reviewed and is negative except as mentioned in HPI  Objective  Virtual encounter, vitals obtained at home   Body mass index is 28.29  kg/m.  Physical Exam  Awake, alert and oriented Normal rom of neck and both shoulders  PHQ2/9: Depression screen [facility] 2/9 06/08/2019 03/18/2019 03/02/2019 01/24/2019 10/22/2018  Decreased Interest 0 0 0 0 0  Down, Depressed, Hopeless 0 0 0 0 0  PHQ - 2 Score 0 0 0 0 0  Altered sleeping 0 - 0 0 0  Tired, decreased energy 0 - 0 0 0  Change in appetite 0 - 0 0 0  Feeling bad or failure about yourself  0 - 0 0 0  Trouble concentrating 0 - 0 0 0  Moving slowly or fidgety/restless 0 - 0 0 0  Suicidal thoughts 0 - 0 0 0  PHQ-9 Score 0 - 0 0 0  Difficult doing work/chores Not difficult at all - - - -  Some recent data might be hidden   PHQ-2/9 Result is negative.    Fall Risk: Fall Risk  06/08/2019 03/18/2019 03/02/2019 01/24/2019 10/22/2018  Falls in the past year? 0 0 0 0 0  Number falls in past yr: 0 0 0 0 0  Injury with Fall? 0 0 0 0 0  Comment - - - - -  Risk for fall due to : - - - - -  Risk for fall due to: Comment - - - - -  Follow up - Falls prevention discussed - - -    Assessment & Plan  1. Arm paresthesia, right  We will try treating for inflammation, holding off on Lyrica until NCS is done  - meloxicam (MOBIC) 15 MG tablet; Take 1 tablet (15 mg total) by mouth daily.  Dispense: 30 tablet; Refill: 0 - Nerve conduction test; Future  2. Benign essential HTN  At goal, continue medications  3. Unstable angina (HCC)  Continue medications and yearly follow up with Dr. [name]   4. Dyslipidemia  On statin therapy, reviewed last labs  5. Dyslipidemia associated with type 2 diabetes mellitus ([place])  Recheck A1C when she comes for follow up in person   6. RLS (restless legs syndrome)  Doing well   7. Chronic bilateral low back pain without sciatica  improved  8. Urinary incontinence in female  improved  I discussed the assessment and treatment plan with the patient. The patient was provided an opportunity to ask questions and all were answered. The patient agreed  with the plan and demonstrated an understanding of the instructions.  The patient was advised to call back or seek an in-person evaluation if the symptoms worsen or if the condition fails to improve as anticipated.  I provided 25  minutes of non-face-to-face time during this encounter.

## 2019-06-23 ENCOUNTER — Encounter: Payer: Self-pay | Admitting: Family Medicine

## 2019-06-27 ENCOUNTER — Other Ambulatory Visit: Payer: Self-pay | Admitting: Family Medicine

## 2019-06-27 MED ORDER — PREGABALIN 50 MG PO CAPS: 50.0000 mg | ORAL_CAPSULE | Freq: Two times a day (BID) | ORAL | 0 refills | Status: DC

## 2019-07-29 ENCOUNTER — Encounter: Payer: Self-pay | Admitting: Family Medicine

## 2019-07-29 DIAGNOSIS — G8929 Other chronic pain: Secondary | ICD-10-CM

## 2019-07-29 DIAGNOSIS — I10 Essential (primary) hypertension: Secondary | ICD-10-CM

## 2019-07-29 DIAGNOSIS — G2581 Restless legs syndrome: Secondary | ICD-10-CM

## 2019-08-01 ENCOUNTER — Other Ambulatory Visit: Payer: Self-pay | Admitting: Family Medicine

## 2019-08-01 DIAGNOSIS — J309 Allergic rhinitis, unspecified: Secondary | ICD-10-CM

## 2019-08-01 MED ORDER — ROPINIROLE HCL 2 MG PO TABS: 2.0000 mg | ORAL_TABLET | Freq: Every day | ORAL | 1 refills | Status: DC

## 2019-08-01 MED ORDER — FLUTICASONE PROPIONATE 50 MCG/ACT NA SUSP: NASAL | 1 refills | Status: DC

## 2019-08-01 MED ORDER — METHOCARBAMOL 500 MG PO TABS: 500.0000 mg | ORAL_TABLET | Freq: Every evening | ORAL | 1 refills | Status: DC | PRN

## 2019-08-01 MED ORDER — CLONIDINE HCL 0.1 MG PO TABS: 0.1000 mg | ORAL_TABLET | Freq: Every evening | ORAL | 1 refills | Status: DC

## 2019-08-04 ENCOUNTER — Other Ambulatory Visit: Payer: Self-pay | Admitting: Family Medicine

## 2019-08-04 ENCOUNTER — Other Ambulatory Visit: Payer: Self-pay

## 2019-08-04 DIAGNOSIS — Z1231 Encounter for screening mammogram for malignant neoplasm of breast: Secondary | ICD-10-CM

## 2019-08-04 MED ORDER — TIZANIDINE HCL 2 MG PO TABS: 2.0000 mg | ORAL_TABLET | Freq: Three times a day (TID) | ORAL | 0 refills | Status: DC | PRN

## 2019-08-04 NOTE — Telephone Encounter (Signed)
Insurance does not cover Robaxin, preferred Tizanidine.

## 2019-08-18 ENCOUNTER — Other Ambulatory Visit: Payer: Self-pay | Admitting: *Deleted

## 2019-08-18 MED ORDER — ISOSORBIDE MONONITRATE ER 30 MG PO TB24: 30.0000 mg | ORAL_TABLET | Freq: Every day | ORAL | 3 refills | Status: DC

## 2019-08-29 ENCOUNTER — Encounter: Payer: Self-pay | Admitting: Family Medicine

## 2019-08-29 ENCOUNTER — Other Ambulatory Visit: Payer: Self-pay

## 2019-08-29 DIAGNOSIS — I10 Essential (primary) hypertension: Secondary | ICD-10-CM

## 2019-08-31 MED ORDER — HYDRALAZINE HCL 50 MG PO TABS: ORAL_TABLET | ORAL | 0 refills | Status: DC

## 2019-08-31 NOTE — Telephone Encounter (Signed)
Left a message to inquire how she takes the Hydralazine. "Is she taking it four times daily or prn?"

## 2019-09-05 ENCOUNTER — Other Ambulatory Visit: Payer: Self-pay

## 2019-09-05 ENCOUNTER — Ambulatory Visit
Admission: RE | Admit: 2019-09-05 | Discharge: 2019-09-05 | Disposition: A | Discharge status: Home / Self Care | Payer: Medicare Other | Source: Ambulatory Visit | Attending: Family Medicine | Admitting: Family Medicine

## 2019-09-05 DIAGNOSIS — Z1231 Encounter for screening mammogram for malignant neoplasm of breast: Secondary | ICD-10-CM | POA: Diagnosis present

## 2019-09-06 ENCOUNTER — Ambulatory Visit: Payer: Medicare Other | Admitting: Family Medicine

## 2019-09-26 ENCOUNTER — Ambulatory Visit (INDEPENDENT_AMBULATORY_CARE_PROVIDER_SITE_OTHER): Payer: Medicare Other | Admitting: Family Medicine

## 2019-09-26 ENCOUNTER — Encounter: Payer: Self-pay | Admitting: Family Medicine

## 2019-09-26 ENCOUNTER — Other Ambulatory Visit: Payer: Self-pay

## 2019-09-26 VITALS — BP 137/70 | HR 66

## 2019-09-26 DIAGNOSIS — E785 Hyperlipidemia, unspecified: Secondary | ICD-10-CM

## 2019-09-26 DIAGNOSIS — I10 Essential (primary) hypertension: Secondary | ICD-10-CM | POA: Diagnosis not present

## 2019-09-26 DIAGNOSIS — I471 Supraventricular tachycardia: Secondary | ICD-10-CM

## 2019-09-26 DIAGNOSIS — M25511 Pain in right shoulder: Secondary | ICD-10-CM | POA: Diagnosis not present

## 2019-09-26 DIAGNOSIS — R202 Paresthesia of skin: Secondary | ICD-10-CM | POA: Diagnosis not present

## 2019-09-26 DIAGNOSIS — E1169 Type 2 diabetes mellitus with other specified complication: Secondary | ICD-10-CM

## 2019-09-26 DIAGNOSIS — G8929 Other chronic pain: Secondary | ICD-10-CM

## 2019-09-26 DIAGNOSIS — N3941 Urge incontinence: Secondary | ICD-10-CM | POA: Diagnosis not present

## 2019-09-26 MED ORDER — CARVEDILOL 25 MG PO TABS: 25.0000 mg | ORAL_TABLET | Freq: Two times a day (BID) | ORAL | 1 refills | Status: DC

## 2019-09-26 MED ORDER — OXYBUTYNIN CHLORIDE 5 MG PO TABS: 5.0000 mg | ORAL_TABLET | Freq: Two times a day (BID) | ORAL | 1 refills | Status: DC

## 2019-09-26 NOTE — Progress Notes (Signed)
Name: Alyssa Lawson   MRN: [id number]    DOB: 04/08/1946   Date:09/26/2019       Progress Note  Subjective  Chief Complaint  Chief Complaint  Patient presents with  . Medication Refill    3 month F/U  . Diabetes  . Hyperlipidemia  . Hypertension  . Urinary Incontinence  . RLS  . Chronic Back pain    I connected with  [name] on 09/26/19 at  2:20 PM EDT by telephone and verified that I am speaking with the correct person using two identifiers.  I discussed the limitations, risks, security and privacy concerns of performing an evaluation and management service by telephone and the availability of in person appointments. Staff also discussed with the patient that there may be a patient responsible charge related to this service. Patient Location: at home  Provider Location: [facility]   HPI  Right arm Tingling: she states that her right arm has been going to sleep, like needles are prickling her, always on radial aspect of right arm. She states it bothers her after she has used her right arm, like after vacuum her house, cooking, making her bed or when she first goes to bed at night. No neck pain or decrease in rom, she denies weakness. Episodes have been going on for about one month. She has a sore spot on top of right shoulder. She states during the months of October and Nov she did a lot of yard work, Games developer, lifting river rocks and is wondering if that should have caused problems. Discussed NCS and anti-inflammatories on her last visit, we tried Lyrica but did not resolve problem, she is willing to see Ortho . Only has symptoms on the tip of her fingers. She has right shoulder pain .   HTN: takingcoreg 25mg  daily, Benicar 40, hydralazine 50mg  QID and clonidine qhs. BPusually between She states bp at home has been well controlled in the 120's-130's/70's-60's No chest pain or palpitation , she denies orthostatic changes. Today around 1 pm it was low - she has been taking  carvedilol and hydralazine at lunch. Advised to take second dose of carvedilol at night and hold hydralazine if bp below 120/80  Hyperlipidemia: last lipid panel was at goal, LDL was 66 . She is taking atorvastatin and denies muscle aches. Unchanged   DMII: diagnosed with diabetes years ago, since 2015 A1C highest level was 6.4%, last A1C done August 2020 was 6.2% Shestopped taking metformin due to pt c/o swelling and weight gain with medication. Pt wastarted Trulicity back in [id number] stopped because of cost. She is now only on life style mofidication, avoids sweets and carbohydrates  She denies polyphagia, polydipsia or polyuria - she has a history of urinary incontinence but is stable. She does not have a monitor at home, she states she only wants one if A1C gets high again   Urinary incontinence: she has been on Ditropan, tolerating medication well, nocturia at most once at night, less frequent that it used to be. She is still doing Kegel exercises   RLS: better with medication, sleeping well with medication,- Requip. No problems as long as she takes medications. Unchanged  Chronic Back Pain: she has daily low back pain, She takes Tylenol prn, mobicand also methocarbamol, She left her job back in May 2018 -shetakes methocarbamol prn, usually after she works out on her garden   Unstable angina: indigestion symptoms with abnormal EKG, normal cardiac cath done Nov 2018 by Dr.  Gollan, on higher dose of Coreg, and Benicar also on Imdur and indigestion resolved  Also on statin therapy and aspirinNo recent episodes. Unchanged   GERD: she still has intermittent symptoms , she is taking Nexium prn , unchanged   Atrial tachycardia: states no recent episodes of palpitation   Patient Active Problem List   Diagnosis Date Noted  . Type 2 diabetes mellitus with complication, with long-term current use of insulin ([place]) 11/09/2018  . Mixed hyperlipidemia 11/09/2018  . Smoker  11/09/2018  . Atrial tachycardia ([place]) 08/25/2017  . Unstable angina ([place]) 05/01/2017  . Positive cardiac stress test 05/01/2017  . Abnormal EKG 04/24/2017  . Smoking history 04/24/2017  . Rotator cuff tendinitis, left 04/10/2017  . Cataracts, bilateral 11/25/2016  . Impingement syndrome of shoulder, left 10/13/2016  . Chronic bilateral low back pain without sciatica 08/20/2016  . Metabolic syndrome [id number]  . Mitral regurgitation 06/22/2015  . Melasma 12/21/2014  . History of hysterectomy 12/21/2014  . Restless leg syndrome 12/21/2014  . Benign essential HTN 12/17/2014  . Cataract 12/17/2014  . Dyslipidemia 12/17/2014  . Gastric reflux 12/17/2014  . History of cervical cancer 12/17/2014  . H/O iron deficiency anemia 12/17/2014  . Urinary incontinence in female 12/17/2014  . Dysmetabolic syndrome [id number]  . TI (tricuspid incompetence) 12/17/2014  . Osteopenia of the elderly 12/17/2014    Past Surgical History:  Procedure Laterality Date  . ABDOMINAL HYSTERECTOMY  1976  . LEFT HEART CATH AND CORONARY ANGIOGRAPHY N/A 05/01/2017   Procedure: LEFT HEART CATH AND CORONARY ANGIOGRAPHY;  Surgeon: Minna Merritts, MD;  Location: [facility];  Service: Cardiovascular;  Laterality: N/A;  . OOPHORECTOMY      Family History  Problem Relation Age of Onset  . Heart failure Father   . Arrhythmia Father   . Hypertension Father   . Arrhythmia Sister   . Hypertension Sister   . Congestive Heart Failure Brother   . Hypertension Brother   . Cardiomyopathy Sister   . Diabetes Brother   . Breast cancer Paternal Aunt 9    Social History   Socioeconomic History  . Marital status: Married    Spouse name: Doren Custard  . Number of children: 1  . Years of education: some college  . Highest education level: 12th grade  Occupational History  . Occupation: Retired  Tobacco Use  . Smoking status: Former Smoker    Packs/day: 0.50    Years: 18.00    Pack years: 9.00     Types: Cigarettes    Start date: 06/23/1956    Quit date: 11/22/1995    Years since quitting: 23.8  . Smokeless tobacco: Former Systems developer    Quit date: 11/22/1996  . Tobacco comment: smoking cessation materials not required  Substance and Sexual Activity  . Alcohol use: Yes    Alcohol/week: 0.0 standard drinks    Comment: socially  . Drug use: No  . Sexual activity: Yes    Partners: Male  Other Topics Concern  . 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 Difficulty of Paying Living Expenses: Not hard at all  Food Insecurity: No Food Insecurity  . Worried About Charity fundraiser in the Last Year: Never true  . Ran Out of Food in the  Last Year: Never true  Transportation Needs: No Transportation Needs  . Lack of Transportation (Medical): No  . Lack of Transportation (Non-Medical): No  Physical Activity: Sufficiently Active  . Days of Exercise per Week: 7 days  . Minutes of Exercise per Session: 40 min  Stress: No Stress Concern Present  . Feeling of Stress : Not at all  Social Connections: Not Isolated  . Frequency of Communication with Friends and Family: More than three times a week  . Frequency of Social Gatherings with Friends and Family: More than three times a week  . Attends Religious Services: More than 4 times per year  . Active Member of Clubs or Organizations: Yes  . Attends Archivist Meetings: More than 4 times per year  . Marital Status: Married  Human resources officer Violence: Not At Risk  . Fear of Current or Ex-Partner: No  . Emotionally Abused: No  . Physically Abused: No  . Sexually Abused: No     Current Outpatient Medications:  .  acetaminophen (TYLENOL) 500 MG tablet, Take 1 tablet (500 mg total) by mouth every 6 (six) hours as needed for  headache., Disp: 30 tablet, Rfl: 0 .  aspirin 81 MG chewable tablet, Chew 1 tablet by mouth daily., Disp: , Rfl:  .  atorvastatin (LIPITOR) 40 MG tablet, TAKE 1 TABLET BY MOUTH  DAILY, Disp: 90 tablet, Rfl: 3 .  B Complex-C-Folic Acid (STRESS XX123456 B-COMPLEX PO), Take by mouth., Disp: , Rfl:  .  carvedilol (COREG) 25 MG tablet, TAKE 1 TABLET BY MOUTH  TWICE DAILY WITH MEALS, Disp: 180 tablet, Rfl: 1 .  cloNIDine (CATAPRES) 0.1 MG tablet, Take 1 tablet (0.1 mg total) by mouth every evening., Disp: 90 tablet, Rfl: 1 .  Cyanocobalamin (VITAMIN B-12) 2000 MCG TBCR, Take by mouth., Disp: , Rfl:  .  diclofenac Sodium (VOLTAREN) 1 % GEL, diclofenac 1 % topical gel, Disp: , Rfl:  .  ferrous sulfate 325 (65 FE) MG tablet, Take 325 mg by mouth daily with breakfast., Disp: , Rfl:  .  fluticasone (FLONASE) 50 MCG/ACT nasal spray, USE 1 SPRAY IN EACH NOSTRIL TWO TIMES DAILY, Disp: 48 g, Rfl: 1 .  hydrALAZINE (APRESOLINE) 50 MG tablet, TAKE 1 TABLET BY MOUTH 4  TIMES DAILY, Disp: 360 tablet, Rfl: 0 .  isosorbide mononitrate (IMDUR) 30 MG 24 hr tablet, Take 1 tablet (30 mg total) by mouth daily., Disp: 90 tablet, Rfl: 3 .  Magnesium Oxide 400 (240 MG) MG TABS, Take 1 tablet by mouth daily., Disp: , Rfl:  .  meloxicam (MOBIC) 15 MG tablet, Take 1 tablet (15 mg total) by mouth daily., Disp: 30 tablet, Rfl: 0 .  Multiple Vitamin (MULTIVITAMIN) capsule, Take 1 capsule by mouth daily., Disp: , Rfl:  .  olmesartan (BENICAR) 40 MG tablet, TAKE 1 TABLET BY MOUTH  DAILY, Disp: 90 tablet, Rfl: 3 .  oxybutynin (DITROPAN) 5 MG tablet, TAKE 1 TABLET BY MOUTH  TWICE DAILY, Disp: 180 tablet, Rfl: 1 .  pregabalin (LYRICA) 50 MG capsule, Take 1 capsule (50 mg total) by mouth 2 (two) times daily., Disp: 60 capsule, Rfl: 0 .  rOPINIRole (REQUIP) 2 MG tablet, Take 1 tablet (2 mg total) by mouth at bedtime., Disp: 90 tablet, Rfl: 1 .  SUPER B COMPLEX/C PO, Take by mouth., Disp: , Rfl:  .  tiZANidine (ZANAFLEX) 2 MG tablet, Take 1  tablet (2 mg total) by mouth every 8 (eight) hours as needed for muscle spasms., Disp: 90  tablet, Rfl: 0 .  valACYclovir (VALTREX) 500 MG tablet, TAKE 1 TABLET BY MOUTH 2  TIMES DAILY. PER EPISODE AS NEEDED, TAKE FOR 10 DAYS (Patient taking differently: TAKE 1 TABLET BY MOUTH 2  TIMES DAILY AS NEEDED. PER EPISODE AS NEEDED, TAKE FOR 10 DAYS), Disp: 90 tablet, Rfl: 0 .  vitamin C (ASCORBIC ACID) 500 MG tablet, Take 500 mg by mouth daily., Disp: , Rfl:   Allergies  Allergen Reactions  . Amlodipine Swelling    I personally reviewed active problem list, medication list, allergies, family history, social history, health maintenance with the patient/caregiver today.   ROS  Ten systems reviewed and is negative except as mentioned in HPI   Objective  Virtual encounter, vitals obtained at home  Vitals:   09/26/19 0130 09/26/19 0924  BP: (!) 103/51 137/70  Pulse: 66 66    There is no height or weight on file to calculate BMI.  Physical Exam  Awake, alert and oriented  PHQ2/9: Depression screen [facility] 2/9 06/08/2019 03/18/2019 03/02/2019 01/24/2019 10/22/2018  Decreased Interest 0 0 0 0 0  Down, Depressed, Hopeless 0 0 0 0 0  PHQ - 2 Score 0 0 0 0 0  Altered sleeping 0 - 0 0 0  Tired, decreased energy 0 - 0 0 0  Change in appetite 0 - 0 0 0  Feeling bad or failure about yourself  0 - 0 0 0  Trouble concentrating 0 - 0 0 0  Moving slowly or fidgety/restless 0 - 0 0 0  Suicidal thoughts 0 - 0 0 0  PHQ-9 Score 0 - 0 0 0  Difficult doing work/chores Not difficult at all - - - -  Some recent data might be hidden   PHQ-2/9 Result is negative.    Fall Risk: Fall Risk  06/08/2019 03/18/2019 03/02/2019 01/24/2019 10/22/2018  Falls in the past year? 0 0 0 0 0  Number falls in past yr: 0 0 0 0 0  Injury with Fall? 0 0 0 0 0  Comment - - - - -  Risk for fall due to : - - - - -  Risk for fall due to: Comment - - - - -  Follow up - Falls prevention discussed - - -     Assessment & Plan  1. Urge  incontinence  - oxybutynin (DITROPAN) 5 MG tablet; Take 1 tablet (5 mg total) by mouth 2 (two) times daily.  Dispense: 180 tablet; Refill: 1  2. Benign essential HTN  - carvedilol (COREG) 25 MG tablet; Take 1 tablet (25 mg total) by mouth 2 (two) times daily with a meal.  Dispense: 180 tablet; Refill: 1  3. Chronic right shoulder pain  - Ambulatory referral to Orthopedic Surgery  4. Arm paresthesia, right  - Ambulatory referral to Orthopedic Surgery  5. Dyslipidemia associated with type 2 diabetes mellitus ([place])  Doing well, she will come in for A1C  6. Atrial tachycardia ([place])  On Coreg  I discussed the assessment and treatment plan with the patient. The patient was provided an opportunity to ask questions and all were answered. The patient agreed with the plan and demonstrated an understanding of the instructions.   The patient was advised to call back or seek an in-person evaluation if the symptoms worsen or if the condition fails to improve as anticipated.  I provided 25  minutes of non-face-to-face time during this encounter.  Loistine Chance, MD

## 2019-09-27 ENCOUNTER — Ambulatory Visit (INDEPENDENT_AMBULATORY_CARE_PROVIDER_SITE_OTHER): Payer: Medicare Other

## 2019-09-27 ENCOUNTER — Other Ambulatory Visit: Payer: Self-pay

## 2019-09-27 VITALS — BP 124/78

## 2019-09-27 DIAGNOSIS — E1169 Type 2 diabetes mellitus with other specified complication: Secondary | ICD-10-CM

## 2019-09-27 DIAGNOSIS — E785 Hyperlipidemia, unspecified: Secondary | ICD-10-CM

## 2019-09-27 LAB — POCT GLYCOSYLATED HEMOGLOBIN (HGB A1C): HbA1c, POC (controlled diabetic range): 5.9 % (ref 0.0–7.0)

## 2019-09-27 NOTE — Progress Notes (Signed)
Patient is here for a blood pressure check. Patient denies chest pain, palpitations, shortness of breath or visual disturbances. At previous visit blood pressure was 137/70 with a heart rate of 66. Today during nurse visit first check blood pressure was 124/78. She does take any blood pressure medications.

## 2019-09-30 ENCOUNTER — Encounter: Payer: Self-pay | Admitting: Family Medicine

## 2019-09-30 DIAGNOSIS — M25511 Pain in right shoulder: Secondary | ICD-10-CM

## 2019-09-30 DIAGNOSIS — G8929 Other chronic pain: Secondary | ICD-10-CM

## 2019-09-30 DIAGNOSIS — M7582 Other shoulder lesions, left shoulder: Secondary | ICD-10-CM

## 2019-10-12 ENCOUNTER — Encounter: Payer: Self-pay | Admitting: Family Medicine

## 2019-11-12 ENCOUNTER — Other Ambulatory Visit: Payer: Self-pay | Admitting: Family Medicine

## 2019-11-12 DIAGNOSIS — I10 Essential (primary) hypertension: Secondary | ICD-10-CM

## 2019-11-23 ENCOUNTER — Other Ambulatory Visit: Payer: Self-pay | Admitting: Family Medicine

## 2019-11-23 DIAGNOSIS — G2581 Restless legs syndrome: Secondary | ICD-10-CM

## 2019-11-29 ENCOUNTER — Other Ambulatory Visit: Payer: Self-pay | Admitting: Family Medicine

## 2019-11-29 DIAGNOSIS — I10 Essential (primary) hypertension: Secondary | ICD-10-CM

## 2019-11-29 NOTE — Telephone Encounter (Signed)
Requested Prescriptions  Pending Prescriptions Disp Refills   cloNIDine (CATAPRES) 0.1 MG tablet [Pharmacy Med Name: CLONIDINE  0.1MG   TAB] 90 tablet 1    Sig: TAKE 1 TABLET BY MOUTH IN  THE EVENING     Cardiovascular:  Alpha-2 Agonists Passed - 11/29/2019  9:14 PM      Passed - Last BP in normal range    BP Readings from Last 1 Encounters:  09/27/19 124/78         Passed - Last Heart Rate in normal range    Pulse Readings from Last 1 Encounters:  09/26/19 66         Passed - Valid encounter within last 6 months    Recent Outpatient Visits          2 months ago Chronic right shoulder pain   [facility] Steele Sizer, MD   5 months ago Arm paresthesia, right   [facility] Steele Sizer, MD   9 months ago [facility] [place], Drue Stager, MD   10 months ago Dyslipidemia associated with type 2 diabetes mellitus [facility])   [facility] Steele Sizer, MD   1 year ago Benign essential HTN   [facility] Steele Sizer, MD      Future Appointments            In 3 months  [facility], [place]   In 4 months Steele Sizer, MD [facility], [facility]

## 2019-12-22 ENCOUNTER — Encounter: Payer: Self-pay | Admitting: Family Medicine

## 2019-12-23 ENCOUNTER — Other Ambulatory Visit: Payer: Self-pay | Admitting: Family Medicine

## 2020-01-13 ENCOUNTER — Other Ambulatory Visit: Payer: Self-pay | Admitting: Family Medicine

## 2020-01-13 DIAGNOSIS — I10 Essential (primary) hypertension: Secondary | ICD-10-CM

## 2020-02-13 ENCOUNTER — Encounter: Payer: Self-pay | Admitting: Family Medicine

## 2020-03-09 ENCOUNTER — Telehealth: Payer: Self-pay | Admitting: *Deleted

## 2020-03-09 NOTE — Chronic Care Management (AMB) (Signed)
  Chronic Care Management   Note  03/09/2020 Name: Alyssa Lawson MRN: [id number] DOB: 08/24/1945  Alyssa Lawson is a 74 y.o. year old female who is a primary care patient of Steele Sizer, MD. I reached out to Alyssa Lawson by phone today in response to a referral sent by Ms. Micheal Likens [employer]'s health plan.     Alyssa Lawson was given information about Chronic Care Management services today including:  1. CCM service includes personalized support from designated clinical staff supervised by her physician, including individualized plan of care and coordination with other care providers 2. 24/7 contact phone numbers for assistance for urgent and routine care needs. 3. Service will only be billed when office clinical staff spend 20 minutes or more in a month to coordinate care. 4. Only one practitioner may furnish and bill the service in a calendar month. 5. The patient may stop CCM services at any time (effective at the end of the month) by phone call to the office staff. 6. The patient will be responsible for cost sharing (co-pay) of up to 20% of the service fee (after annual deductible is met).  Patient did not agree to enrollment in care management services and does not wish to consider at this time.  Follow up plan: The care management team is available to follow up with the patient after provider conversation with the patient regarding recommendation for care management engagement and subsequent re-referral to the care management team.   [place] [facility]

## 2020-03-09 NOTE — Chronic Care Management (AMB) (Signed)
  Chronic Care Management   Outreach Note  03/09/2020 Name: DORIEN BESSENT MRN: [id number] DOB: [birth year]  [name] is a 74 y.o. year old female who is a primary care patient of Steele Sizer, MD. I reached out to [name] by phone today in response to a referral sent by Ms. Micheal Likens [employer]'s health plan.     An unsuccessful telephone outreach was attempted today. The patient was referred to the case management team for assistance with care management and care coordination.   Follow Up Plan: A HIPAA compliant phone message was left for the patient providing contact information and requesting a return call. The care management team will reach out to the patient again over the next 7 days. If patient returns call to provider office, please advise to call [place] at [phone].  [place], [place] [address] Direct Dial: [phone] [email or web address] Website: [email or web address]

## 2020-03-20 ENCOUNTER — Other Ambulatory Visit: Payer: Self-pay | Admitting: Family Medicine

## 2020-03-20 DIAGNOSIS — E785 Hyperlipidemia, unspecified: Secondary | ICD-10-CM

## 2020-03-20 NOTE — Telephone Encounter (Signed)
Requested medications are due for refill today? Yes  Requested medications are on active medication list? Yes  Last Refill: 04/28/2019  # 90 with three refills   Future visit scheduled?  Yes in one week.    Notes to Clinic:  Medication failed Rx refill protocol due to no labs within the past 360 days.  Last labs were performed on 01/24/2019.

## 2020-03-21 NOTE — Telephone Encounter (Signed)
Requested medications are due for refill today? Yes  Requested medications are on active medication list? Yes  Last Refill:  04/28/2019  # 90 with 3 refills   Future visit scheduled? Yes in one week.    Notes to Clinic:  Medication failed Rx refill protocol due to no labs within the past 360 days.  Last labs were performed on 01/24/2019.

## 2020-03-22 ENCOUNTER — Ambulatory Visit (INDEPENDENT_AMBULATORY_CARE_PROVIDER_SITE_OTHER): Payer: Medicare Other

## 2020-03-22 DIAGNOSIS — Z0000 Encounter for general adult medical examination without abnormal findings: Secondary | ICD-10-CM

## 2020-03-22 NOTE — Patient Instructions (Signed)
Alyssa Lawson , Thank you for taking time to come for your Medicare Wellness Visit. I appreciate your ongoing commitment to your health goals. Please review the following plan we discussed and let me know if I can assist you in the future.   Screening recommendations/referrals: Colonoscopy: done 02/10/13 Mammogram: done 09/05/19 Bone Density: done 08/04/18 Recommended yearly ophthalmology/optometry visit for glaucoma screening and checkup Recommended yearly dental visit for hygiene and checkup  Vaccinations: Influenza vaccine: due Pneumococcal vaccine: done 12/21/14 Tdap vaccine: done 01/20/18 Shingles vaccine: Shingrix discussed. Please contact your pharmacy for coverage information.  Covid-19: done 06/23/19 7 07/22/19  Advanced directives: Advance directive discussed with you today. I have provided a copy for you to complete at home and have notarized. Once this is complete please bring a copy in to our office so we can scan it into your chart.  Conditions/risks identified: Keep up the great work!  Next appointment: Follow up in one year for your annual wellness visit    Preventive Care 65 Years and Older, Female Preventive care refers to lifestyle choices and visits with your health care provider that can promote health and wellness. What does preventive care include?  A yearly physical exam. This is also called an annual well check.  Dental exams once or twice a year.  Routine eye exams. Ask your health care provider how often you should have your eyes checked.  Personal lifestyle choices, including:  Daily care of your teeth and gums.  Regular physical activity.  Eating a healthy diet.  Avoiding tobacco and drug use.  Limiting alcohol use.  Practicing safe sex.  Taking low-dose aspirin every day.  Taking vitamin and mineral supplements as recommended by your health care provider. What happens during an annual well check? The services and screenings done by your health  care provider during your annual well check will depend on your age, overall health, lifestyle risk factors, and family history of disease. Counseling  Your health care provider may ask you questions about your:  Alcohol use.  Tobacco use.  Drug use.  Emotional well-being.  Home and relationship well-being.  Sexual activity.  Eating habits.  History of falls.  Memory and ability to understand (cognition).  Work and work Statistician.  Reproductive health. Screening  You may have the following tests or measurements:  Height, weight, and BMI.  Blood pressure.  Lipid and cholesterol levels. These may be checked every 5 years, or more frequently if you are over 5 years old.  Skin check.  Lung cancer screening. You may have this screening every year starting at age 82 if you have a 30-pack-year history of smoking and currently smoke or have quit within the past 15 years.  Fecal occult blood test (FOBT) of the stool. You may have this test every year starting at age 29.  Flexible sigmoidoscopy or colonoscopy. You may have a sigmoidoscopy every 5 years or a colonoscopy every 10 years starting at age 23.  Hepatitis C blood test.  Hepatitis B blood test.  Sexually transmitted disease (STD) testing.  Diabetes screening. This is done by checking your blood sugar (glucose) after you have not eaten for a while (fasting). You may have this done every 1-3 years.  Bone density scan. This is done to screen for osteoporosis. You may have this done starting at age 75.  Mammogram. This may be done every 1-2 years. Talk to your health care provider about how often you should have regular mammograms. Talk with your health care  provider about your test results, treatment options, and if necessary, the need for more tests. Vaccines  Your health care provider may recommend certain vaccines, such as:  Influenza vaccine. This is recommended every year.  Tetanus, diphtheria, and  acellular pertussis (Tdap, Td) vaccine. You may need a Td booster every 10 years.  Zoster vaccine. You may need this after age 48.  Pneumococcal 13-valent conjugate (PCV13) vaccine. One dose is recommended after age 25.  Pneumococcal polysaccharide (PPSV23) vaccine. One dose is recommended after age 28. Talk to your health care provider about which screenings and vaccines you need and how often you need them. This information is not intended to replace advice given to you by your health care provider. Make sure you discuss any questions you have with your health care provider. Document Released: 07/06/2015 Document Revised: 02/27/2016 Document Reviewed: 04/10/2015 Elsevier Interactive Patient Education  2017 [place] Prevention in the Home Falls can cause injuries. They can happen to people of all ages. There are many things you can do to make your home safe and to help prevent falls. What can I do on the outside of my home?  Regularly fix the edges of walkways and driveways and fix any cracks.  Remove anything that might make you trip as you walk through a door, such as a raised step or threshold.  Trim any bushes or trees on the path to your home.  Use bright outdoor lighting.  Clear any walking paths of anything that might make someone trip, such as rocks or tools.  Regularly check to see if handrails are loose or broken. Make sure that both sides of any steps have handrails.  Any raised decks and porches should have guardrails on the edges.  Have any leaves, snow, or ice cleared regularly.  Use sand or salt on walking paths during winter.  Clean up any spills in your garage right away. This includes oil or grease spills. What can I do in the bathroom?  Use night lights.  Install grab bars by the toilet and in the tub and shower. Do not use towel bars as grab bars.  Use non-skid mats or decals in the tub or shower.  If you need to sit down in the shower, use  a plastic, non-slip stool.  Keep the floor dry. Clean up any water that spills on the floor as soon as it happens.  Remove soap buildup in the tub or shower regularly.  Attach bath mats securely with double-sided non-slip rug tape.  Do not have throw rugs and other things on the floor that can make you trip. What can I do in the bedroom?  Use night lights.  Make sure that you have a light by your bed that is easy to reach.  Do not use any sheets or blankets that are too big for your bed. They should not hang down onto the floor.  Have a firm chair that has side arms. You can use this for support while you get dressed.  Do not have throw rugs and other things on the floor that can make you trip. What can I do in the kitchen?  Clean up any spills right away.  Avoid walking on wet floors.  Keep items that you use a lot in easy-to-reach places.  If you need to reach something above you, use a strong step stool that has a grab bar.  Keep electrical cords out of the way.  Do not use floor polish  or wax that makes floors slippery. If you must use wax, use non-skid floor wax.  Do not have throw rugs and other things on the floor that can make you trip. What can I do with my stairs?  Do not leave any items on the stairs.  Make sure that there are handrails on both sides of the stairs and use them. Fix handrails that are broken or loose. Make sure that handrails are as long as the stairways.  Check any carpeting to make sure that it is firmly attached to the stairs. Fix any carpet that is loose or worn.  Avoid having throw rugs at the top or bottom of the stairs. If you do have throw rugs, attach them to the floor with carpet tape.  Make sure that you have a light switch at the top of the stairs and the bottom of the stairs. If you do not have them, ask someone to add them for you. What else can I do to help prevent falls?  Wear shoes that:  Do not have high heels.  Have  rubber bottoms.  Are comfortable and fit you well.  Are closed at the toe. Do not wear sandals.  If you use a stepladder:  Make sure that it is fully opened. Do not climb a closed stepladder.  Make sure that both sides of the stepladder are locked into place.  Ask someone to hold it for you, if possible.  Clearly mark and make sure that you can see:  Any grab bars or handrails.  First and last steps.  Where the edge of each step is.  Use tools that help you move around (mobility aids) if they are needed. These include:  Canes.  Walkers.  Scooters.  Crutches.  Turn on the lights when you go into a dark area. Replace any light bulbs as soon as they burn out.  Set up your furniture so you have a clear path. Avoid moving your furniture around.  If any of your floors are uneven, fix them.  If there are any pets around you, be aware of where they are.  Review your medicines with your doctor. Some medicines can make you feel dizzy. This can increase your chance of falling. Ask your doctor what other things that you can do to help prevent falls. This information is not intended to replace advice given to you by your health care provider. Make sure you discuss any questions you have with your health care provider. Document Released: 04/05/2009 Document Revised: 11/15/2015 Document Reviewed: 07/14/2014 Elsevier Interactive Patient Education  2017 [employer].

## 2020-03-22 NOTE — Progress Notes (Signed)
Subjective:   Alyssa Lawson is a 74 y.o. female who presents for Medicare Annual (Subsequent) preventive examination.  Virtual Visit via Telephone Note  I connected with  Alyssa Lawson on 03/22/20 at 10:40 AM EDT by telephone and verified that I am speaking with the correct person using two identifiers.  Medicare Annual Wellness visit completed telephonically due to Covid-19 pandemic.   Location: Patient: home Provider: [facility]   I discussed the limitations, risks, security and privacy concerns of performing an evaluation and management service by telephone and the availability of in person appointments. The patient expressed understanding and agreed to proceed.  Unable to perform video visit due to video visit attempted and failed and/or patient does not have video capability.   Some vital signs may be absent or patient reported.   Clemetine Marker, LPN    Review of Systems     Cardiac Risk Factors include: advanced age (>55men, >37 women);dyslipidemia;diabetes mellitus;hypertension     Objective:    There were no vitals filed for this visit. There is no height or weight on file to calculate BMI.  Advanced Directives 03/22/2020 03/18/2019 06/05/2018 03/16/2018 05/01/2017 04/10/2017 01/07/2017  Does Patient Have a Medical Advance Directive? No No Yes Yes Yes Yes Yes  Type of Advance Directive - Public librarian;Living will [place];Living will [place];Living will [place];Living will [place];Living will  Does patient want to make changes to medical advance directive? - - - - No - Patient declined - -  Copy of [place] in Chart? - - - No - copy requested No - copy requested Yes No - copy requested  Would patient like information on creating a medical advance directive? Yes (MAU/Ambulatory/Procedural Areas - Information given) Yes (MAU/Ambulatory/Procedural Areas -  Information given) - - - - -    Current Medications (verified) Outpatient Encounter Medications as of 03/22/2020  Medication Sig  . acetaminophen (TYLENOL) 500 MG tablet Take 1 tablet (500 mg total) by mouth every 6 (six) hours as needed for headache.  Marland Kitchen aspirin 81 MG chewable tablet Chew 1 tablet by mouth daily.  Marland Kitchen atorvastatin (LIPITOR) 40 MG tablet TAKE 1 TABLET BY MOUTH  DAILY  . carvedilol (COREG) 25 MG tablet Take 1 tablet (25 mg total) by mouth 2 (two) times daily with a meal.  . Cholecalciferol (VITAMIN D) 50 MCG (2000 UT) CAPS Take 1 capsule by mouth daily.  . cloNIDine (CATAPRES) 0.1 MG tablet TAKE 1 TABLET BY MOUTH IN  THE EVENING  . Cyanocobalamin (VITAMIN B-12) 2000 MCG TBCR Take by mouth.  . ferrous sulfate 325 (65 FE) MG tablet Take 325 mg by mouth daily with breakfast.  . fluticasone (FLONASE) 50 MCG/ACT nasal spray USE 1 SPRAY IN EACH NOSTRIL TWO TIMES DAILY  . hydrALAZINE (APRESOLINE) 50 MG tablet TAKE 1 TABLET BY MOUTH 4  TIMES DAILY  . isosorbide mononitrate (IMDUR) 30 MG 24 hr tablet Take 1 tablet (30 mg total) by mouth daily.  . Multiple Vitamin (MULTIVITAMIN) capsule Take 1 capsule by mouth daily.  Marland Kitchen olmesartan (BENICAR) 40 MG tablet TAKE 1 TABLET BY MOUTH  DAILY  . oxybutynin (DITROPAN) 5 MG tablet Take 1 tablet (5 mg total) by mouth 2 (two) times daily.  Marland Kitchen rOPINIRole (REQUIP) 2 MG tablet TAKE 1 TABLET BY MOUTH AT  BEDTIME  . SUPER B COMPLEX/C PO Take by mouth.  Marland Kitchen tiZANidine (ZANAFLEX) 2 MG tablet Take 1  tablet (2 mg total) by mouth every 8 (eight) hours as needed for muscle spasms.  . valACYclovir (VALTREX) 500 MG tablet TAKE 1 TABLET BY MOUTH 2  TIMES DAILY. PER EPISODE AS NEEDED, TAKE FOR 10 DAYS (Patient taking differently: TAKE 1 TABLET BY MOUTH 2  TIMES DAILY AS NEEDED. PER EPISODE AS NEEDED, TAKE FOR 10 DAYS)  . vitamin C (ASCORBIC ACID) 500 MG tablet Take 500 mg by mouth daily.  . pregabalin (LYRICA) 50 MG capsule Take 1 capsule (50 mg total) by mouth 2 (two)  times daily. (Patient not taking: Reported on 03/22/2020)  . [DISCONTINUED] B Complex-C-Folic Acid (STRESS 423 B-COMPLEX PO) Take by mouth.  . [DISCONTINUED] diclofenac Sodium (VOLTAREN) 1 % GEL diclofenac 1 % topical gel  . [DISCONTINUED] Magnesium Oxide 400 (240 MG) MG TABS Take 1 tablet by mouth daily.  . [DISCONTINUED] meloxicam (MOBIC) 15 MG tablet Take 1 tablet (15 mg total) by mouth daily.   No facility-administered encounter medications on file as of 03/22/2020.    Allergies (verified) Amlodipine   History: Past Medical History:  Diagnosis Date  . Bladder disorder    a. urinary incontinence  . Cancer ([place])    cervical ca  . Coronary artery disease, non-occlusive    a. LHC 05/01/2017: LM no sig dz, LAD no sig dz, LCx no sig dz, RCA no sig dz, EF 55-65%, mod MR  . Endometriosis   . GERD (gastroesophageal reflux disease)   . Hyperlipidemia   . Hypertension   . Mitral regurgitation    a. TTE 05/01/2017: EF 55-60%, no RWMA, not technically sufficient to allow for LV diastolic function, mild to moderate mitral regurgitation, mildly dilated left atrium, RV systolic function normal, PASP 35 mmHg, small to moderate pericardial effusion was noted  . Restless leg syndrome    Past Surgical History:  Procedure Laterality Date  . ABDOMINAL HYSTERECTOMY  1976  . LEFT HEART CATH AND CORONARY ANGIOGRAPHY N/A 05/01/2017   Procedure: LEFT HEART CATH AND CORONARY ANGIOGRAPHY;  Surgeon: Minna Merritts, MD;  Location: [facility];  Service: Cardiovascular;  Laterality: N/A;  . OOPHORECTOMY     Family History  Problem Relation Age of Onset  . Heart failure Father   . Arrhythmia Father   . Hypertension Father   . Arrhythmia Sister   . Hypertension Sister   . Congestive Heart Failure Brother   . Hypertension Brother   . Cardiomyopathy Sister   . Diabetes Brother   . Breast cancer Paternal Aunt 41   Social History   Socioeconomic History  . Marital status: Married    Spouse  name: Doren Custard  . Number of children: 1  . Years of education: some college  . Highest education level: 12th grade  Occupational History  . Occupation: Retired  Tobacco Use  . Smoking status: Former Smoker    Packs/day: 0.50    Years: 18.00    Pack years: 9.00    Types: Cigarettes    Start date: 06/23/1956    Quit date: 11/22/1995    Years since quitting: 24.3  . Smokeless tobacco: Former Systems developer    Quit date: 11/22/1996  . Tobacco comment: smoking cessation materials not required  Vaping Use  . Vaping Use: Never used  Substance and Sexual Activity  . Alcohol use: Yes    Alcohol/week: 0.0 standard drinks    Comment: socially  . Drug use: No  . Sexual activity: Yes    Partners: Male  Other Topics Concern  .  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 Difficulty of Paying Living Expenses: Not hard at all  Food Insecurity: No Food Insecurity  . Worried About Charity fundraiser in the Last Year: Never true  . Ran Out of Food in the Last Year: Never true  Transportation Needs: No Transportation Needs  . Lack of Transportation (Medical): No  . Lack of Transportation (Non-Medical): No  Physical Activity: Inactive  . Days of Exercise per Week: 0 days  . Minutes of Exercise per Session: 0 min  Stress: No Stress Concern Present  . Feeling of Stress : Not at all  Social Connections: Socially Integrated  . Frequency of Communication with Friends and Family: More than three times a week  . Frequency of Social Gatherings with Friends and Family: More than three times a week  . Attends Religious Services: More than 4 times per year  . Active Member of Clubs or Organizations: Yes  . Attends Archivist Meetings: More than 4 times per year  . Marital Status: Married     Tobacco Counseling Counseling given: Not Answered Comment: smoking cessation materials not required   Clinical Intake:  Pre-visit preparation completed: Yes  Pain : No/denies pain     Nutritional Risks: None Diabetes: Yes CBG done?: No Did pt. bring in CBG monitor from home?: No  How often do you need to have someone help you when you read instructions, pamphlets, or other written materials from your doctor or pharmacy?: 1 - Never  Nutrition Risk Assessment:  Has the patient had any N/V/D within the last 2 months?  No  Does the patient have any non-healing wounds?  No  Has the patient had any unintentional weight loss or weight gain?  No   Diabetes:  Is the patient diabetic?  Yes  If diabetic, was a CBG obtained today?  No  Did the patient bring in their glucometer from home?  No  How often do you monitor your CBG's? Pt does not actively check blood sugar.   Financial Strains and Diabetes Management:  Are you having any financial strains with the device, your supplies or your medication? No .  Does the patient want to be seen by Chronic Care Management for management of their diabetes?  No  Would the patient like to be referred to a Nutritionist or for Diabetic Management?  No   Diabetic Exams:  Diabetic Eye Exam: Completed 02/17/19 negative retinopathy. Overdue for diabetic eye exam. Pt states she has an appt scheduled for next week.   Diabetic Foot Exam: Completed 01/24/19. Pt has been advised about the importance in completing this exam. Pt is scheduled for diabetic foot exam on 108/21   Interpreter Needed?: No  Information entered by :: Clemetine Marker LPN   Activities of Daily Living In your present state of health, do you have any difficulty performing the following activities: 03/22/2020 06/08/2019  Hearing? N N  Comment declines hearing aids -  Vision? Y N  Difficulty concentrating or making decisions? N N  Walking or climbing stairs? N N  Dressing or  bathing? N N  Doing errands, shopping? N N  Preparing Food and eating ? N -  Using the Toilet? N -  In the past six months,  have you accidently leaked urine? N -  Do you have problems with loss of bowel control? N -  Managing your Medications? N -  Managing your Finances? N -  Housekeeping or managing your Housekeeping? N -  Some recent data might be hidden    Patient Care Team: Steele Sizer, MD as PCP - General (Family Medicine) Minna Merritts, MD as Consulting Physician (Cardiology)  Indicate any recent Medical Services you may have received from other than [facility] providers in the past year (date may be approximate).     Assessment:   This is a routine wellness examination for [facility].  Hearing/Vision screen  Hearing Screening   125Hz  250Hz  500Hz  1000Hz  2000Hz  3000Hz  4000Hz  6000Hz  8000Hz   Right ear:           Left ear:           Comments: Pt denies hearing difficulty  Vision Screening Comments: Annual vision screenings done at [facility] issues and exercise activities discussed: Current Exercise Habits: The patient does not participate in regular exercise at present, Exercise limited by: None identified  Goals    . DIET - REDUCE SUGAR INTAKE     Recommend to eliminate sugar intake and to eat 3 small healthy meals and at least 2 healthy snacks per day.    . Increase physical activity     Recommend increasing physical activity and continuing to garden      Depression Screen PHQ 2/9 Scores 03/22/2020 06/08/2019 03/18/2019 03/02/2019 01/24/2019 10/22/2018 04/21/2018  PHQ - 2 Score 0 0 0 0 0 0 0  PHQ- 9 Score - 0 - 0 0 0 0    Fall Risk Fall Risk  03/22/2020 06/08/2019 03/18/2019 03/02/2019 01/24/2019  Falls in the past year? 0 0 0 0 0  Number falls in past yr: 0 0 0 0 0  Injury with Fall? 0 0 0 0 0  Comment - - - - -  Risk for fall due to : No Fall Risks - - - -  Risk for fall due to: Comment - - - - -  Follow up Falls prevention discussed - Falls prevention discussed  - -    Any stairs in or around the home? Yes  If so, are there any without handrails? No  Home free of loose throw rugs in walkways, pet beds, electrical cords, etc? Yes  Adequate lighting in your home to reduce risk of falls? Yes   ASSISTIVE DEVICES UTILIZED TO PREVENT FALLS:  Life alert? No  Use of a cane, walker or w/c? No  Grab bars in the bathroom? Yes  Shower chair or bench in shower? No  Elevated toilet seat or a handicapped toilet? Yes   TIMED UP AND GO:  Was the test performed? No . Telephonic visit.   Cognitive Function:     6CIT Screen 03/22/2020 03/18/2019 03/16/2018  What Year? 0 points 0 points 0 points  What month? 0 points 0 points 0 points  What time? 0 points 0 points 0 points  Count back from 20 0 points 0 points 0 points  Months in reverse 0 points 0 points 0 points  Repeat phrase 0 points 0 points 4 points  Total Score 0 0 4    Immunizations Immunization History  Administered Date(s) Administered  . Fluad Quad(high Dose 65+) 05/10/2019  . Influenza Split 04/27/2007, 04/19/2009  . Influenza, High Dose Seasonal PF 06/22/2015, 06/06/2016, 02/25/2017, 04/21/2018  . Influenza,inj,Quad PF,6+ Mos 03/15/2013, 06/19/2014  .  Moderna SARS-COVID-2 Vaccination 06/23/2019, 07/22/2019  . Pneumococcal Conjugate-13 12/21/2014  . Pneumococcal Polysaccharide-23 04/27/2007, 09/14/2012  . Td 11/02/2007  . Tdap 01/20/2018  . Zoster 07/12/2013    TDAP status: Up to date   Flu vaccine status: due for 2021-2022 season  Pneumococcal vaccine status: Up to date   Covid-19 vaccine status: Completed vaccines  Qualifies for Shingles Vaccine? Yes   Zostavax completed Yes   Shingrix Completed?: No.    Education has been provided regarding the importance of this vaccine. Patient has been advised to call insurance company to determine out of pocket expense if they have not yet received this vaccine. Advised may also receive vaccine at local pharmacy or Health Dept. Verbalized  acceptance and understanding.  Screening Tests Health Maintenance  Topic Date Due  . INFLUENZA VACCINE  01/22/2020  . FOOT EXAM  01/24/2020  . OPHTHALMOLOGY EXAM  02/17/2020  . HEMOGLOBIN A1C  03/28/2020  . MAMMOGRAM  09/04/2020  . COLONOSCOPY  02/11/2023  . TETANUS/TDAP  01/21/2028  . DEXA SCAN  Completed  . COVID-19 Vaccine  Completed  . Hepatitis C Screening  Completed  . PNA vac Low Risk Adult  Completed    Health Maintenance  Health Maintenance Due  Topic Date Due  . INFLUENZA VACCINE  01/22/2020  . FOOT EXAM  01/24/2020  . OPHTHALMOLOGY EXAM  02/17/2020    Colorectal cancer screening: Completed 02/10/13. Repeat every 10 years    Mammogram status: Completed 09/05/19. Repeat every year   Bone Density status: Completed 08/04/18. Results reflect: Bone density results: NORMAL. Repeat every 2 years.  Lung Cancer Screening: (Low Dose CT Chest recommended if Age 14-80 years, 30 pack-year currently smoking OR have quit w/in 15years.) does not qualify.   Additional Screening:  Hepatitis C Screening: does qualify; Completed 09/14/12   Vision Screening: Recommended annual ophthalmology exams for early detection of glaucoma and other disorders of the eye. Is the patient up to date with their annual eye exam?  Yes  Who is the provider or what is the name of the office in which the patient attends annual eye exams? [facility]   Dental Screening: Recommended annual dental exams for proper oral hygiene  Community Resource Referral / Chronic Care Management: CRR required this visit?  No   CCM required this visit?  No      Plan:     I have personally reviewed and noted the following in the patient's chart:   . Medical and social history . Use of alcohol, tobacco or illicit drugs  . Current medications and supplements . Functional ability and status . Nutritional status . Physical activity . Advanced directives . List of other physicians . Hospitalizations, surgeries,  and ER visits in previous 12 months . Vitals . Screenings to include cognitive, depression, and falls . Referrals and appointments  In addition, I have reviewed and discussed with patient certain preventive protocols, quality metrics, and best practice recommendations. A written personalized care plan for preventive services as well as general preventive health recommendations were provided to patient.     Clemetine Marker, LPN   [id number]   Nurse Notes: none

## 2020-03-29 NOTE — Progress Notes (Signed)
Name: Alyssa Lawson   MRN: [id number]    DOB: 05-24-1946   Date:03/30/2020       Progress Note  Subjective  Chief Complaint  Chief Complaint  Patient presents with  . Follow-up    6 months  . Diabetes  . Hyperlipidemia    HPI  Hand paraesthesia : she states that her right arm has been going to sleep, like needles are prickling her, always on radial aspect of right arm that started about one year ago  She states initially was with activity and only on right , it was triggered by vaccumed thehouse, cooking, making her bed or when she first goes to bed at night. 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  Discussed NCS and referral to Ortho, she tried Lyrica earlier this year and it improved symptoms and would like to try it again. She would like to go to [facility]   HTN: takingcoreg 25mg  daily, Benicar 40, hydralazine 50mg  QID and clonidine qhs. BPusually between100-120's-67-70's No chest pain or palpitation , she denies orthostatic changes. Today at home it was 121/67, but when she arrived to our office it was 144/60 but improved with rest - she is down taking Hydralazine three times a day instead of 4 times a day. We will try going down on dose to 25 mg and monitor   Hyperlipidemia:last lipid panel was at goal, LDL was 66. She is taking atorvastatin and denies muscle aches.   DMII: diagnosed with diabetes years ago, since 2015 A1C highest level was 6.4%,today is 6 %Shestopped taking metformin due to pt c/o swelling and weight gain with medication. Pt wastarted Trulicity back in 20/2542HCW stopped because of cost. She is now only on life style mofidication, avoids sweetsand carbohydrates,she last lost almost 6 lbs since last visit She denies polyphagia, polydipsia or polyuria - she has a history  of urinary incontinence but is stable.  Urinary incontinence: she has been on Ditropan, tolerating medication well, nocturia at most once at night,less frequent than it used to be. She is still doing Kegel exercises  RLS: better with medication, sleeping well with medication,- Requip. No problems as long as she takes medications. Doing well on medication   Chronic Back Pain: she has daily low back pain, She takes Tylenol prn, mobicand also methocarbamol, She left her job back in May 2018 -shetakes Tizanidine prn   Unstable angina: indigestion symptoms with abnormal EKG, normal cardiac cath done Nov 2018 by Dr. [name], on higher dose of Coreg, and Benicar also on Imdur and indigestionresolved, therefore symptoms controlled with medication management Also on statin therapy and aspirinNo recent episodes.   GERD: she still has intermittent symptoms , she is taking Nexium otc prn , unchanged   Atrial tachycardia: states no recent episodes of palpitation, she is on beta blocker     Patient Active Problem List   Diagnosis Date Noted  . Type 2 diabetes mellitus with complication, with long-term current use of insulin ([place]) 11/09/2018  . Mixed hyperlipidemia 11/09/2018  . Smoker 11/09/2018  . Atrial tachycardia ([place]) 08/25/2017  . Unstable angina ([place]) 05/01/2017  . Positive cardiac stress test 05/01/2017  . Abnormal EKG 04/24/2017  . Smoking history 04/24/2017  . Rotator cuff tendinitis, left 04/10/2017  . Cataracts, bilateral 11/25/2016  . Impingement  syndrome of shoulder, left 10/13/2016  . Chronic bilateral low back pain without sciatica 08/20/2016  . Metabolic syndrome 29/52/8413  . Mitral regurgitation 06/22/2015  . Melasma 12/21/2014  . History of hysterectomy 12/21/2014  . Restless leg syndrome 12/21/2014  . Benign essential HTN 12/17/2014  . Cataract 12/17/2014  . Dyslipidemia 12/17/2014  . Gastric reflux 12/17/2014  . History of cervical cancer 12/17/2014  .  H/O iron deficiency anemia 12/17/2014  . Urinary incontinence in female 12/17/2014  . Dysmetabolic syndrome 24/40/1027  . TI (tricuspid incompetence) 12/17/2014  . Osteopenia of the elderly 12/17/2014    Past Surgical History:  Procedure Laterality Date  . ABDOMINAL HYSTERECTOMY  1976  . LEFT HEART CATH AND CORONARY ANGIOGRAPHY N/A 05/01/2017   Procedure: LEFT HEART CATH AND CORONARY ANGIOGRAPHY;  Surgeon: Minna Merritts, MD;  Location: [facility];  Service: Cardiovascular;  Laterality: N/A;  . OOPHORECTOMY      Family History  Problem Relation Age of Onset  . Heart failure Father   . Arrhythmia Father   . Hypertension Father   . Arrhythmia Sister   . Hypertension Sister   . Congestive Heart Failure Brother   . Hypertension Brother   . Cardiomyopathy Sister   . Diabetes Brother   . Breast cancer Paternal Aunt 31    Social History   Tobacco Use  . Smoking status: Former Smoker    Packs/day: 0.50    Years: 18.00    Pack years: 9.00    Types: Cigarettes    Start date: 06/23/1956    Quit date: 11/22/1995    Years since quitting: 24.3  . Smokeless tobacco: Former Systems developer    Quit date: 11/22/1996  . Tobacco comment: smoking cessation materials not required  Substance Use Topics  . Alcohol use: Yes    Alcohol/week: 0.0 standard drinks    Comment: socially     Current Outpatient Medications:  .  acetaminophen (TYLENOL) 500 MG tablet, Take 1 tablet (500 mg total) by mouth every 6 (six) hours as needed for headache., Disp: 30 tablet, Rfl: 0 .  aspirin 81 MG chewable tablet, Chew 1 tablet by mouth daily., Disp: , Rfl:  .  atorvastatin (LIPITOR) 40 MG tablet, TAKE 1 TABLET BY MOUTH  DAILY, Disp: 90 tablet, Rfl: 3 .  carvedilol (COREG) 25 MG tablet, Take 1 tablet (25 mg total) by mouth 2 (two) times daily with a meal., Disp: 180 tablet, Rfl: 1 .  Cholecalciferol (VITAMIN D) 50 MCG (2000 UT) CAPS, Take 1 capsule by mouth daily., Disp: , Rfl:  .  cloNIDine (CATAPRES) 0.1  MG tablet, Take 1 tablet (0.1 mg total) by mouth every evening., Disp: 90 tablet, Rfl: 1 .  ferrous sulfate 325 (65 FE) MG tablet, Take 325 mg by mouth daily with breakfast., Disp: , Rfl:  .  fluticasone (FLONASE) 50 MCG/ACT nasal spray, USE 1 SPRAY IN EACH NOSTRIL TWO TIMES DAILY, Disp: 48 g, Rfl: 1 .  hydrALAZINE (APRESOLINE) 25 MG tablet, Take 1 tablet (25 mg total) by mouth 3 (three) times daily., Disp: 270 tablet, Rfl: 0 .  isosorbide mononitrate (IMDUR) 30 MG 24 hr tablet, Take 1 tablet (30 mg total) by mouth daily., Disp: 90 tablet, Rfl: 3 .  Multiple Vitamin (MULTIVITAMIN) capsule, Take 1 capsule by mouth daily., Disp: , Rfl:  .  olmesartan (BENICAR) 40 MG tablet, Take 1 tablet (40 mg total) by mouth daily., Disp: 90 tablet, Rfl: 1 .  oxybutynin (DITROPAN) 5 MG tablet, Take 1  tablet (5 mg total) by mouth 2 (two) times daily., Disp: 180 tablet, Rfl: 1 .  rOPINIRole (REQUIP) 2 MG tablet, Take 1 tablet (2 mg total) by mouth at bedtime., Disp: 90 tablet, Rfl: 1 .  SUPER B COMPLEX/C PO, Take by mouth., Disp: , Rfl:  .  tiZANidine (ZANAFLEX) 2 MG tablet, Take 1 tablet (2 mg total) by mouth every 8 (eight) hours as needed for muscle spasms., Disp: 90 tablet, Rfl: 0 .  valACYclovir (VALTREX) 500 MG tablet, TAKE 1 TABLET BY MOUTH 2  TIMES DAILY. PER EPISODE AS NEEDED, TAKE FOR 10 DAYS (Patient taking differently: TAKE 1 TABLET BY MOUTH 2  TIMES DAILY AS NEEDED. PER EPISODE AS NEEDED, TAKE FOR 10 DAYS), Disp: 90 tablet, Rfl: 0 .  vitamin C (ASCORBIC ACID) 500 MG tablet, Take 500 mg by mouth daily., Disp: , Rfl:  .  Cyanocobalamin (VITAMIN B-12) 2000 MCG TBCR, Take by mouth. (Patient not taking: Reported on 03/30/2020), Disp: , Rfl:  .  pregabalin (LYRICA) 50 MG capsule, Take 1 capsule (50 mg total) by mouth 2 (two) times daily., Disp: 180 capsule, Rfl: 1  Allergies  Allergen Reactions  . Amlodipine Swelling    I personally reviewed active problem list, medication list, allergies, family history,  social history, health maintenance with the patient/caregiver today.   ROS  Constitutional: Negative for fever or weight change.  Respiratory: Negative for cough and shortness of breath.   Cardiovascular: Negative for chest pain or palpitations.  Gastrointestinal: Negative for abdominal pain, no bowel changes.  Musculoskeletal: Negative for gait problem or joint swelling.  Skin: Negative for rash.  Neurological: Negative for dizziness or headache.  No other specific complaints in a complete review of systems (except as listed in HPI above).   Objective  Vitals:   03/30/20 1313 03/30/20 1338  BP: (!) 144/60 136/72  Pulse: 64   Temp: 98 F (36.7 C)   SpO2: 100%   Weight: 164 lb 8 oz (74.6 kg)   Height: 5\' 4"  (1.626 m)     Body mass index is 28.24 kg/m.  Physical Exam  Constitutional: Patient appears well-developed and well-nourished. Overweight.  No distress.  HEENT: head atraumatic, normocephalic, pupils equal and reactive to light,  neck supple Cardiovascular: Normal rate, regular rhythm and normal heart sounds.  2/6 systolic ejection murmur heard. No BLE edema. Pulmonary/Chest: Effort normal and breath sounds normal. No respiratory distress. Abdominal: Soft.  There is no tenderness. Psychiatric: Patient has a normal mood and affect. behavior is normal. Judgment and thought content normal.  Recent Results (from the past 2160 hour(s))  POCT HgB A1C     Status: Abnormal   Collection Time: 03/30/20  1:35 PM  Result Value Ref Range   Hemoglobin A1C 6.0 (A) 4.0 - 5.6 %   HbA1c POC (<> result, manual entry)     HbA1c, POC (prediabetic range)     HbA1c, POC (controlled diabetic range)      Diabetic Foot Exam: Diabetic Foot Exam - Simple   Simple Foot Form Diabetic Foot exam was performed with the following findings: Yes 03/30/2020  1:58 PM  Visual Inspection No deformities, no ulcerations, no other skin breakdown bilaterally: Yes Sensation Testing Intact to touch and  monofilament testing bilaterally: Yes Pulse Check Posterior Tibialis and Dorsalis pulse intact bilaterally: Yes Comments      PHQ2/9: Depression screen [facility] 2/9 03/30/2020 03/22/2020 06/08/2019 03/18/2019 03/02/2019  Decreased Interest 0 0 0 0 0  Down, Depressed, Hopeless 0 0 0  0 0  PHQ - 2 Score 0 0 0 0 0  Altered sleeping - - 0 - 0  Tired, decreased energy - - 0 - 0  Change in appetite - - 0 - 0  Feeling bad or failure about yourself  - - 0 - 0  Trouble concentrating - - 0 - 0  Moving slowly or fidgety/restless - - 0 - 0  Suicidal thoughts - - 0 - 0  PHQ-9 Score - - 0 - 0  Difficult doing work/chores - - Not difficult at all - -  Some recent data might be hidden    phq 9 is negative   Fall Risk: Fall Risk  03/30/2020 03/22/2020 06/08/2019 03/18/2019 03/02/2019  Falls in the past year? 0 0 0 0 0  Number falls in past yr: 0 0 0 0 0  Injury with Fall? 0 0 0 0 0  Comment - - - - -  Risk for fall due to : - No Fall Risks - - -  Risk for fall due to: Comment - - - - -  Follow up - Falls prevention discussed - Falls prevention discussed -     Functional Status Survey: Is the patient deaf or have difficulty hearing?: Yes Does the patient have difficulty seeing, even when wearing glasses/contacts?: Yes (Pt says it's age.) Does the patient have difficulty concentrating, remembering, or making decisions?: No Does the patient have difficulty walking or climbing stairs?: No Does the patient have difficulty dressing or bathing?: No Does the patient have difficulty doing errands alone such as visiting a doctor's office or shopping?: No    Assessment & Plan  1. Dyslipidemia associated with type 2 diabetes mellitus (HCC)  - POCT HgB A1C - HM Diabetes Foot Exam - Urine Microalbumin w/creat. ratio - COMPLETE METABOLIC PANEL WITH GFR  2. Need for influenza vaccination  - Flu Vaccine QUAD High Dose(Fluad)  3. Benign essential HTN  - CBC with Differential/Platelet - COMPLETE  METABOLIC PANEL WITH GFR - carvedilol (COREG) 25 MG tablet; Take 1 tablet (25 mg total) by mouth 2 (two) times daily with a meal.  Dispense: 180 tablet; Refill: 1 - cloNIDine (CATAPRES) 0.1 MG tablet; Take 1 tablet (0.1 mg total) by mouth every evening.  Dispense: 90 tablet; Refill: 1 - hydrALAZINE (APRESOLINE) 25 MG tablet; Take 1 tablet (25 mg total) by mouth 3 (three) times daily.  Dispense: 270 tablet; Refill: 0 - olmesartan (BENICAR) 40 MG tablet; Take 1 tablet (40 mg total) by mouth daily.  Dispense: 90 tablet; Refill: 1  4. Unstable angina (HCC)  Controlled with medical management   5. Atrial tachycardia ([place])   6. Dyslipidemia  - Lipid panel  7. Chronic bilateral low back pain without sciatica  - tiZANidine (ZANAFLEX) 2 MG tablet; Take 1 tablet (2 mg total) by mouth every 8 (eight) hours as needed for muscle spasms.  Dispense: 90 tablet; Refill: 0  8. Paresthesia of both hands  - Ambulatory referral to Orthopedic Surgery - pregabalin (LYRICA) 50 MG capsule; Take 1 capsule (50 mg total) by mouth 2 (two) times daily.  Dispense: 180 capsule; Refill: 1  9. Allergic rhinitis, unspecified seasonality, unspecified trigger  - fluticasone (FLONASE) 50 MCG/ACT nasal spray; USE 1 SPRAY IN EACH NOSTRIL TWO TIMES DAILY  Dispense: 48 g; Refill: 1  10. Urge incontinence  - oxybutynin (DITROPAN) 5 MG tablet; Take 1 tablet (5 mg total) by mouth 2 (two) times daily.  Dispense: 180 tablet; Refill: 1  11.  RLS (restless legs syndrome)  - rOPINIRole (REQUIP) 2 MG tablet; Take 1 tablet (2 mg total) by mouth at bedtime.  Dispense: 90 tablet; Refill: 1

## 2020-03-30 ENCOUNTER — Ambulatory Visit (INDEPENDENT_AMBULATORY_CARE_PROVIDER_SITE_OTHER): Payer: Medicare Other | Admitting: Family Medicine

## 2020-03-30 ENCOUNTER — Other Ambulatory Visit: Payer: Self-pay

## 2020-03-30 ENCOUNTER — Encounter: Payer: Self-pay | Admitting: Family Medicine

## 2020-03-30 DIAGNOSIS — E785 Hyperlipidemia, unspecified: Secondary | ICD-10-CM

## 2020-03-30 DIAGNOSIS — Z23 Encounter for immunization: Secondary | ICD-10-CM | POA: Diagnosis not present

## 2020-03-30 DIAGNOSIS — I200 Unstable angina: Secondary | ICD-10-CM

## 2020-03-30 DIAGNOSIS — E1169 Type 2 diabetes mellitus with other specified complication: Secondary | ICD-10-CM | POA: Diagnosis not present

## 2020-03-30 DIAGNOSIS — I4719 Other supraventricular tachycardia: Secondary | ICD-10-CM

## 2020-03-30 DIAGNOSIS — G2581 Restless legs syndrome: Secondary | ICD-10-CM

## 2020-03-30 DIAGNOSIS — M545 Low back pain, unspecified: Secondary | ICD-10-CM

## 2020-03-30 DIAGNOSIS — G8929 Other chronic pain: Secondary | ICD-10-CM

## 2020-03-30 DIAGNOSIS — I471 Supraventricular tachycardia: Secondary | ICD-10-CM | POA: Diagnosis not present

## 2020-03-30 DIAGNOSIS — I10 Essential (primary) hypertension: Secondary | ICD-10-CM | POA: Diagnosis not present

## 2020-03-30 DIAGNOSIS — N3941 Urge incontinence: Secondary | ICD-10-CM

## 2020-03-30 DIAGNOSIS — R202 Paresthesia of skin: Secondary | ICD-10-CM

## 2020-03-30 DIAGNOSIS — J309 Allergic rhinitis, unspecified: Secondary | ICD-10-CM

## 2020-03-30 LAB — POCT GLYCOSYLATED HEMOGLOBIN (HGB A1C): Hemoglobin A1C: 6 % — AB (ref 4.0–5.6)

## 2020-03-30 MED ORDER — PREGABALIN 50 MG PO CAPS: 50.0000 mg | ORAL_CAPSULE | Freq: Two times a day (BID) | ORAL | 1 refills | Status: DC

## 2020-03-30 MED ORDER — TIZANIDINE HCL 2 MG PO TABS: 2.0000 mg | ORAL_TABLET | Freq: Three times a day (TID) | ORAL | 0 refills | Status: DC | PRN

## 2020-03-30 MED ORDER — CARVEDILOL 25 MG PO TABS: 25.0000 mg | ORAL_TABLET | Freq: Two times a day (BID) | ORAL | 1 refills | Status: DC

## 2020-03-30 MED ORDER — CLONIDINE HCL 0.1 MG PO TABS: 0.1000 mg | ORAL_TABLET | Freq: Every evening | ORAL | 1 refills | Status: DC

## 2020-03-30 MED ORDER — OXYBUTYNIN CHLORIDE 5 MG PO TABS: 5.0000 mg | ORAL_TABLET | Freq: Two times a day (BID) | ORAL | 1 refills | Status: DC

## 2020-03-30 MED ORDER — HYDRALAZINE HCL 25 MG PO TABS: 25.0000 mg | ORAL_TABLET | Freq: Three times a day (TID) | ORAL | 0 refills | Status: DC

## 2020-03-30 MED ORDER — FLUTICASONE PROPIONATE 50 MCG/ACT NA SUSP: NASAL | 1 refills | Status: DC

## 2020-03-30 MED ORDER — ROPINIROLE HCL 2 MG PO TABS: 2.0000 mg | ORAL_TABLET | Freq: Every day | ORAL | 1 refills | Status: DC

## 2020-03-30 MED ORDER — OLMESARTAN MEDOXOMIL 40 MG PO TABS: 40.0000 mg | ORAL_TABLET | Freq: Every day | ORAL | 1 refills | Status: DC

## 2020-03-31 LAB — COMPLETE METABOLIC PANEL WITH GFR
AG Ratio: 1.8 (calc) (ref 1.0–2.5)
ALT: 21 U/L (ref 6–29)
AST: 19 U/L (ref 10–35)
Albumin: 4.4 g/dL (ref 3.6–5.1)
Alkaline phosphatase (APISO): 60 U/L (ref 37–153)
BUN/Creatinine Ratio: 21 (calc) (ref 6–22)
BUN: 24 mg/dL (ref 7–25)
CO2: 30 mmol/L (ref 20–32)
Calcium: 10.2 mg/dL (ref 8.6–10.4)
Chloride: 106 mmol/L (ref 98–110)
Creat: 1.14 mg/dL — ABNORMAL HIGH (ref 0.60–0.93)
GFR, Est African American: 55 mL/min/{1.73_m2} — ABNORMAL LOW (ref >=60)
GFR, Est Non African American: 47 mL/min/{1.73_m2} — ABNORMAL LOW (ref >=60)
Globulin: 2.5 g/dL (calc) (ref 1.9–3.7)
Glucose, Bld: 113 mg/dL — ABNORMAL HIGH (ref 65–99)
Potassium: 4.3 mmol/L (ref 3.5–5.3)
Sodium: 142 mmol/L (ref 135–146)
Total Bilirubin: 0.6 mg/dL (ref 0.2–1.2)
Total Protein: 6.9 g/dL (ref 6.1–8.1)

## 2020-03-31 LAB — CBC WITH DIFFERENTIAL/PLATELET
Absolute Monocytes: 533 cells/uL (ref 200–950)
Basophils Absolute: 37 cells/uL (ref 0–200)
Basophils Relative: 0.5 %
Eosinophils Absolute: 178 cells/uL (ref 15–500)
Eosinophils Relative: 2.4 %
HCT: 40.9 % (ref 35.0–45.0)
Hemoglobin: 13.6 g/dL (ref 11.7–15.5)
Lymphs Abs: 1643 cells/uL (ref 850–3900)
MCH: 30.8 pg (ref 27.0–33.0)
MCHC: 33.3 g/dL (ref 32.0–36.0)
MCV: 92.7 fL (ref 80.0–100.0)
MPV: 11.9 fL (ref 7.5–12.5)
Monocytes Relative: 7.2 %
Neutro Abs: 5010 cells/uL (ref 1500–7800)
Neutrophils Relative %: 67.7 %
Platelets: 187 10*3/uL (ref 140–400)
RBC: 4.41 10*6/uL (ref 3.80–5.10)
RDW: 12 % (ref 11.0–15.0)
Total Lymphocyte: 22.2 %
WBC: 7.4 10*3/uL (ref 3.8–10.8)

## 2020-03-31 LAB — MICROALBUMIN / CREATININE URINE RATIO
Creatinine, Urine: 126 mg/dL (ref 20–275)
Microalb Creat Ratio: 7 mcg/mg creat (ref <30)
Microalb, Ur: 0.9 mg/dL

## 2020-03-31 LAB — LIPID PANEL
Cholesterol: 133 mg/dL (ref <200)
HDL: 67 mg/dL (ref >=50)
LDL Cholesterol (Calc): 52 mg/dL (calc)
Non-HDL Cholesterol (Calc): 66 mg/dL (calc) (ref <130)
Total CHOL/HDL Ratio: 2 (calc) (ref <5.0)
Triglycerides: 59 mg/dL (ref <150)

## 2020-04-23 DIAGNOSIS — M542 Cervicalgia: Secondary | ICD-10-CM | POA: Diagnosis not present

## 2020-04-23 DIAGNOSIS — F17200 Nicotine dependence, unspecified, uncomplicated: Secondary | ICD-10-CM | POA: Diagnosis not present

## 2020-04-23 DIAGNOSIS — E118 Type 2 diabetes mellitus with unspecified complications: Secondary | ICD-10-CM | POA: Diagnosis not present

## 2020-04-23 DIAGNOSIS — M5412 Radiculopathy, cervical region: Secondary | ICD-10-CM | POA: Diagnosis not present

## 2020-04-23 DIAGNOSIS — M5030 Other cervical disc degeneration, unspecified cervical region: Secondary | ICD-10-CM | POA: Diagnosis not present

## 2020-04-30 ENCOUNTER — Other Ambulatory Visit: Payer: Self-pay

## 2020-04-30 ENCOUNTER — Ambulatory Visit (INDEPENDENT_AMBULATORY_CARE_PROVIDER_SITE_OTHER): Payer: Medicare Other | Admitting: Family Medicine

## 2020-04-30 ENCOUNTER — Encounter: Payer: Self-pay | Admitting: Family Medicine

## 2020-04-30 VITALS — BP 124/82 | HR 66 | Temp 98.7°F | Resp 16 | Ht 64.0 in | Wt 170.0 lb

## 2020-04-30 DIAGNOSIS — I10 Essential (primary) hypertension: Secondary | ICD-10-CM | POA: Diagnosis not present

## 2020-04-30 DIAGNOSIS — R202 Paresthesia of skin: Secondary | ICD-10-CM

## 2020-04-30 DIAGNOSIS — R944 Abnormal results of kidney function studies: Secondary | ICD-10-CM

## 2020-04-30 NOTE — Progress Notes (Signed)
Name: Alyssa Lawson   MRN: [id number]    DOB: Oct 05, 1945   Date:04/30/2020       Progress Note  Subjective  Chief Complaint  Follow up   HPI    Hand paraesthesia : She had a prednisone taper last week and is feeling better, she still has some paresthesia on right finger tips of thumb and index of right side, but much better She is also on Lyrica bid   HTN: takingcoreg 25mg  daily, Benicar 40, hydralazine 25 mg TID ( down from 50 QID )  clonidine qhs. BP since the medication adjustment is better in the 120-130's/70-mid80's, except for once that was 90's/70's but did not happen again. She denies chest pain or palpitation Last labs showed a drop of GFR, she states not taking nsaid's otc, we will recheck level today    Patient Active Problem List   Diagnosis Date Noted  . Type 2 diabetes mellitus with complication, with long-term current use of insulin ([place]) 11/09/2018  . Mixed hyperlipidemia 11/09/2018  . Smoker 11/09/2018  . Atrial tachycardia ([place]) 08/25/2017  . Unstable angina ([place]) 05/01/2017  . Positive cardiac stress test 05/01/2017  . Abnormal EKG 04/24/2017  . Smoking history 04/24/2017  . Rotator cuff tendinitis, left 04/10/2017  . Cataracts, bilateral 11/25/2016  . Impingement syndrome of shoulder, left 10/13/2016  . Chronic bilateral low back pain without sciatica 08/20/2016  . Metabolic syndrome 32/35/5732  . Mitral regurgitation 06/22/2015  . Melasma 12/21/2014  . History of hysterectomy 12/21/2014  . Restless leg syndrome 12/21/2014  . Benign essential HTN 12/17/2014  . Cataract 12/17/2014  . Dyslipidemia 12/17/2014  . Gastric reflux 12/17/2014  . History of cervical cancer 12/17/2014  . H/O iron deficiency anemia 12/17/2014  . Urinary incontinence in female 12/17/2014  . Dysmetabolic syndrome 20/25/4270  . TI (tricuspid incompetence) 12/17/2014  . Osteopenia of the elderly 12/17/2014    Past Surgical History:  Procedure Laterality Date  . ABDOMINAL  HYSTERECTOMY  1976  . LEFT HEART CATH AND CORONARY ANGIOGRAPHY N/A 05/01/2017   Procedure: LEFT HEART CATH AND CORONARY ANGIOGRAPHY;  Surgeon: Minna Merritts, MD;  Location: [facility];  Service: Cardiovascular;  Laterality: N/A;  . OOPHORECTOMY      Family History  Problem Relation Age of Onset  . Heart failure Father   . Arrhythmia Father   . Hypertension Father   . Arrhythmia Sister   . Hypertension Sister   . Congestive Heart Failure Brother   . Hypertension Brother   . Cardiomyopathy Sister   . Diabetes Brother   . Breast cancer Paternal Aunt 58    Social History   Tobacco Use  . Smoking status: Former Smoker    Packs/day: 0.50    Years: 18.00    Pack years: 9.00    Types: Cigarettes    Start date: 06/23/1956    Quit date: 11/22/1995    Years since quitting: 24.4  . Smokeless tobacco: Former Systems developer    Quit date: 11/22/1996  . Tobacco comment: smoking cessation materials not required  Substance Use Topics  . Alcohol use: Yes    Alcohol/week: 0.0 standard drinks    Comment: socially     Current Outpatient Medications:  .  acetaminophen (TYLENOL) 500 MG tablet, Take 1 tablet (500 mg total) by mouth every 6 (six) hours as needed for headache., Disp: 30 tablet, Rfl: 0 .  aspirin 81 MG chewable tablet, Chew 1 tablet by mouth daily., Disp: , Rfl:  .  atorvastatin (LIPITOR) 40 MG tablet, TAKE 1 TABLET BY MOUTH  DAILY, Disp: 90 tablet, Rfl: 3 .  carvedilol (COREG) 25 MG tablet, Take 1 tablet (25 mg total) by mouth 2 (two) times daily with a meal., Disp: 180 tablet, Rfl: 1 .  Cholecalciferol (VITAMIN D) 50 MCG (2000 UT) CAPS, Take 1 capsule by mouth daily., Disp: , Rfl:  .  cloNIDine (CATAPRES) 0.1 MG tablet, Take 1 tablet (0.1 mg total) by mouth every evening., Disp: 90 tablet, Rfl: 1 .  Cyanocobalamin (VITAMIN B-12) 2000 MCG TBCR, Take by mouth. , Disp: , Rfl:  .  ferrous sulfate 325 (65 FE) MG tablet, Take 325 mg by mouth daily with breakfast., Disp: , Rfl:  .   fluticasone (FLONASE) 50 MCG/ACT nasal spray, USE 1 SPRAY IN EACH NOSTRIL TWO TIMES DAILY, Disp: 48 g, Rfl: 1 .  hydrALAZINE (APRESOLINE) 25 MG tablet, Take 1 tablet (25 mg total) by mouth 3 (three) times daily., Disp: 270 tablet, Rfl: 0 .  isosorbide mononitrate (IMDUR) 30 MG 24 hr tablet, Take 1 tablet (30 mg total) by mouth daily., Disp: 90 tablet, Rfl: 3 .  MAGNESIUM-ZINC PO, Take by mouth., Disp: , Rfl:  .  Multiple Vitamin (MULTIVITAMIN) capsule, Take 1 capsule by mouth daily., Disp: , Rfl:  .  olmesartan (BENICAR) 40 MG tablet, Take 1 tablet (40 mg total) by mouth daily., Disp: 90 tablet, Rfl: 1 .  oxybutynin (DITROPAN) 5 MG tablet, Take 1 tablet (5 mg total) by mouth 2 (two) times daily., Disp: 180 tablet, Rfl: 1 .  predniSONE (DELTASONE) 10 MG tablet, Take 10 mg by mouth daily., Disp: , Rfl:  .  pregabalin (LYRICA) 50 MG capsule, Take 1 capsule (50 mg total) by mouth 2 (two) times daily., Disp: 180 capsule, Rfl: 1 .  rOPINIRole (REQUIP) 2 MG tablet, Take 1 tablet (2 mg total) by mouth at bedtime., Disp: 90 tablet, Rfl: 1 .  SUPER B COMPLEX/C PO, Take by mouth., Disp: , Rfl:  .  tiZANidine (ZANAFLEX) 2 MG tablet, Take 1 tablet (2 mg total) by mouth every 8 (eight) hours as needed for muscle spasms., Disp: 90 tablet, Rfl: 0 .  valACYclovir (VALTREX) 500 MG tablet, TAKE 1 TABLET BY MOUTH 2  TIMES DAILY. PER EPISODE AS NEEDED, TAKE FOR 10 DAYS (Patient taking differently: TAKE 1 TABLET BY MOUTH 2  TIMES DAILY AS NEEDED. PER EPISODE AS NEEDED, TAKE FOR 10 DAYS), Disp: 90 tablet, Rfl: 0 .  vitamin C (ASCORBIC ACID) 500 MG tablet, Take 500 mg by mouth daily., Disp: , Rfl:  .  Vitamins-Lipotropics (COMPLEX B-100-INOSITOL) TBCR, Take by mouth., Disp: , Rfl:   Allergies  Allergen Reactions  . Amlodipine Swelling  . Meloxicam Palpitations    I personally reviewed active problem list, medication list, allergies, family history, social history, health maintenance with the patient/caregiver  today.   ROS  Ten systems reviewed and is negative except as mentioned in HPI \  Objective  Vitals:   04/30/20 1304  BP: 124/82  Pulse: 66  Resp: 16  Temp: 98.7 F (37.1 C)  TempSrc: Oral  SpO2: 98%  Weight: 170 lb (77.1 kg)  Height: 5\' 4"  (1.626 m)    Body mass index is 29.18 kg/m.  Physical Exam  Constitutional: Patient appears well-developed and well-nourished. Overweight.  No distress.  HEENT: head atraumatic, normocephalic, pupils equal and reactive to light, neck supple Cardiovascular: Normal rate, regular rhythm and normal heart sounds.  No murmur heard. No BLE edema. Pulmonary/Chest:  Effort normal and breath sounds normal. No respiratory distress. Abdominal: Soft.  There is no tenderness. Psychiatric: Patient has a normal mood and affect. behavior is normal. Judgment and thought content normal.  Recent Results (from the past 2160 hour(s))  POCT HgB A1C     Status: Abnormal   Collection Time: 03/30/20  1:35 PM  Result Value Ref Range   Hemoglobin A1C 6.0 (A) 4.0 - 5.6 %   HbA1c POC (<> result, manual entry)     HbA1c, POC (prediabetic range)     HbA1c, POC (controlled diabetic range)    Lipid panel     Status: None   Collection Time: 03/30/20  2:20 PM  Result Value Ref Range   Cholesterol 133 <200 mg/dL   HDL 67 > OR = 50 mg/dL   Triglycerides 59 <150 mg/dL   LDL Cholesterol (Calc) 52 mg/dL (calc)    Comment: Reference range: <100 . Desirable range <100 mg/dL for primary prevention;   <70 mg/dL for patients with CHD or diabetic patients  with > or = 2 CHD risk factors. Marland Kitchen LDL-C is now calculated using the Martin-Hopkins  calculation, which is a validated novel method providing  better accuracy than the Friedewald equation in the  estimation of LDL-C.  Cresenciano Genre et al. Annamaria Helling. 6734;193(79): 2061-2068  (http://education.[employer].com/faq/FAQ164)    Total CHOL/HDL Ratio 2.0 <5.0 (calc)   Non-HDL Cholesterol (Calc) 66 <130 mg/dL (calc)     Comment: For patients with diabetes plus 1 major ASCVD risk  factor, treating to a non-HDL-C goal of <100 mg/dL  (LDL-C of <70 mg/dL) is considered a therapeutic  option.   CBC with Differential/Platelet     Status: None   Collection Time: 03/30/20  2:20 PM  Result Value Ref Range   WBC 7.4 3.8 - 10.8 Thousand/uL   RBC 4.41 3.80 - 5.10 Million/uL   Hemoglobin 13.6 11.7 - 15.5 g/dL   HCT 40.9 35 - 45 %   MCV 92.7 80.0 - 100.0 fL   MCH 30.8 27.0 - 33.0 pg   MCHC 33.3 32.0 - 36.0 g/dL   RDW 12.0 11.0 - 15.0 %   Platelets 187 140 - 400 Thousand/uL   MPV 11.9 7.5 - 12.5 fL   Neutro Abs 5,010 1,500 - 7,800 cells/uL   Lymphs Abs 1,643 850 - 3,900 cells/uL   Absolute Monocytes 533 200 - 950 cells/uL   Eosinophils Absolute 178 15.0 - 500.0 cells/uL   Basophils Absolute 37 0.0 - 200.0 cells/uL   Neutrophils Relative % 67.7 %   Total Lymphocyte 22.2 %   Monocytes Relative 7.2 %   Eosinophils Relative 2.4 %   Basophils Relative 0.5 %  COMPLETE METABOLIC PANEL WITH GFR     Status: Abnormal   Collection Time: 03/30/20  2:20 PM  Result Value Ref Range   Glucose, Bld 113 (H) 65 - 99 mg/dL    Comment: .            Fasting reference interval . For someone without known diabetes, a glucose value between 100 and 125 mg/dL is consistent with prediabetes and should be confirmed with a follow-up test. .    BUN 24 7 - 25 mg/dL   Creat 1.14 (H) 0.60 - 0.93 mg/dL    Comment: For patients >64 years of age, the reference limit for Creatinine is approximately 13% higher for people identified as African-American. .    GFR, Est Non African American 47 (L) > OR = 60 mL/min/1.76m2  GFR, Est African American 55 (L) > OR = 60 mL/min/1.81m2   BUN/Creatinine Ratio 21 6 - 22 (calc)   Sodium 142 135 - 146 mmol/L   Potassium 4.3 3.5 - 5.3 mmol/L   Chloride 106 98 - 110 mmol/L   CO2 30 20 - 32 mmol/L   Calcium 10.2 8.6 - 10.4 mg/dL   Total Protein 6.9 6.1 - 8.1 g/dL   Albumin 4.4 3.6 - 5.1 g/dL    Globulin 2.5 1.9 - 3.7 g/dL (calc)   AG Ratio 1.8 1.0 - 2.5 (calc)   Total Bilirubin 0.6 0.2 - 1.2 mg/dL   Alkaline phosphatase (APISO) 60 37 - 153 U/L   AST 19 10 - 35 U/L   ALT 21 6 - 29 U/L  Microalbumin / creatinine urine ratio     Status: None   Collection Time: 03/30/20  2:20 PM  Result Value Ref Range   Creatinine, Urine 126 20 - 275 mg/dL   Microalb, Ur 0.9 mg/dL    Comment: Reference Range Not established    Microalb Creat Ratio 7 <30 mcg/mg creat    Comment: . The ADA defines abnormalities in albumin excretion as follows: Marland Kitchen Albuminuria Category        Result (mcg/mg creatinine) . Normal to Mildly increased   <30 Moderately increased         30-299  Severely increased           > OR = 300 . The ADA recommends that at least two of three specimens collected within a 3-6 month period be abnormal before considering a patient to be within a diagnostic category.      PHQ2/9: Depression screen [facility] 2/9 04/30/2020 03/30/2020 03/22/2020 06/08/2019 03/18/2019  Decreased Interest 0 0 0 0 0  Down, Depressed, Hopeless 0 0 0 0 0  PHQ - 2 Score 0 0 0 0 0  Altered sleeping - - - 0 -  Tired, decreased energy - - - 0 -  Change in appetite - - - 0 -  Feeling bad or failure about yourself  - - - 0 -  Trouble concentrating - - - 0 -  Moving slowly or fidgety/restless - - - 0 -  Suicidal thoughts - - - 0 -  PHQ-9 Score - - - 0 -  Difficult doing work/chores - - - Not difficult at all -  Some recent data might be hidden    phq 9 is negative   Fall Risk: Fall Risk  04/30/2020 03/30/2020 03/22/2020 06/08/2019 03/18/2019  Falls in the past year? 0 0 0 0 0  Number falls in past yr: 0 0 0 0 0  Injury with Fall? 0 0 0 0 0  Comment - - - - -  Risk for fall due to : - - No Fall Risks - -  Risk for fall due to: Comment - - - - -  Follow up - - Falls prevention discussed - Falls prevention discussed    Functional Status Survey: Is the patient deaf or have difficulty hearing?: No Does  the patient have difficulty seeing, even when wearing glasses/contacts?: No Does the patient have difficulty concentrating, remembering, or making decisions?: No Does the patient have difficulty walking or climbing stairs?: No Does the patient have difficulty dressing or bathing?: No Does the patient have difficulty doing errands alone such as visiting a doctor's office or shopping?: No    Assessment & Plan  1. Benign essential HTN  - BASIC METABOLIC PANEL  WITH GFR  2. Arm paresthesia, right   3. Decreased calculated GFR  - BASIC METABOLIC PANEL WITH GFR

## 2020-05-01 LAB — BASIC METABOLIC PANEL WITH GFR
BUN/Creatinine Ratio: 22 (calc) (ref 6–22)
BUN: 24 mg/dL (ref 7–25)
CO2: 27 mmol/L (ref 20–32)
Calcium: 10 mg/dL (ref 8.6–10.4)
Chloride: 104 mmol/L (ref 98–110)
Creat: 1.1 mg/dL — ABNORMAL HIGH (ref 0.60–0.93)
GFR, Est African American: 57 mL/min/{1.73_m2} — ABNORMAL LOW (ref >=60)
GFR, Est Non African American: 49 mL/min/{1.73_m2} — ABNORMAL LOW (ref >=60)
Glucose, Bld: 91 mg/dL (ref 65–99)
Potassium: 4.3 mmol/L (ref 3.5–5.3)
Sodium: 140 mmol/L (ref 135–146)

## 2020-05-31 ENCOUNTER — Other Ambulatory Visit: Payer: Self-pay | Admitting: Family Medicine

## 2020-05-31 DIAGNOSIS — I10 Essential (primary) hypertension: Secondary | ICD-10-CM

## 2020-06-04 ENCOUNTER — Other Ambulatory Visit: Payer: Self-pay | Admitting: Family Medicine

## 2020-06-04 DIAGNOSIS — R202 Paresthesia of skin: Secondary | ICD-10-CM

## 2020-06-10 ENCOUNTER — Other Ambulatory Visit: Payer: Self-pay | Admitting: Cardiovascular Disease

## 2020-06-11 NOTE — Telephone Encounter (Signed)
Please schedule appointment for refills. Thank you! 

## 2020-06-11 NOTE — Telephone Encounter (Signed)
LVM for pt to schedule.

## 2020-06-26 ENCOUNTER — Other Ambulatory Visit: Payer: Self-pay | Admitting: Cardiovascular Disease

## 2020-06-26 NOTE — Telephone Encounter (Signed)
LVM for patient to schedule overdue fu 

## 2020-06-27 ENCOUNTER — Other Ambulatory Visit: Payer: Self-pay | Admitting: Cardiovascular Disease

## 2020-07-12 NOTE — Telephone Encounter (Signed)
LVM for patient to schedule.

## 2020-07-13 NOTE — Telephone Encounter (Signed)
Unable to contact to schedule

## 2020-07-14 ENCOUNTER — Other Ambulatory Visit: Payer: Self-pay | Admitting: Family Medicine

## 2020-07-14 DIAGNOSIS — M545 Low back pain, unspecified: Secondary | ICD-10-CM

## 2020-07-14 DIAGNOSIS — G8929 Other chronic pain: Secondary | ICD-10-CM

## 2020-07-14 NOTE — Telephone Encounter (Signed)
Requested medication (s) are due for refill today:yes  Requested medication (s) are on the active medication list:yes  Last refill: 03/30/20  #90   0 refills  Future visit scheduled yes   Notes to clinic: not delegated  Requested Prescriptions  Pending Prescriptions Disp Refills   tiZANidine (ZANAFLEX) 2 MG tablet [Pharmacy Med Name: TIZANIDINE  2MG   TAB] 90 tablet 0    Sig: TAKE 1 TABLET BY MOUTH  EVERY 8 HOURS AS NEEDED FOR MUSCLE SPASM(S)      Not Delegated - Cardiovascular:  Alpha-2 Agonists - tizanidine Failed - 07/14/2020  5:48 PM      Failed - This refill cannot be delegated      Passed - Valid encounter within last 6 months    Recent Outpatient Visits           2 months ago Benign essential HTN   [facility], Drue Stager, MD   3 months ago Dyslipidemia associated with type 2 diabetes mellitus [facility])   [facility] Steele Sizer, MD   9 months ago Chronic right shoulder pain   [facility] Steele Sizer, MD   1 year ago Arm paresthesia, right   [facility] Steele Sizer, MD   1 year ago [facility] Steele Sizer, MD       Future Appointments             In 2 weeks Steele Sizer, MD [facility], [place]   In 8 months  [facility], [facility]

## 2020-07-16 ENCOUNTER — Other Ambulatory Visit: Payer: Self-pay

## 2020-07-30 ENCOUNTER — Other Ambulatory Visit: Payer: Self-pay | Admitting: Family Medicine

## 2020-07-30 DIAGNOSIS — Z1231 Encounter for screening mammogram for malignant neoplasm of breast: Secondary | ICD-10-CM

## 2020-07-30 NOTE — Progress Notes (Deleted)
Name: Alyssa Lawson   MRN: [id number]    DOB: 09-Apr-1946   Date:07/30/2020       Progress Note  Subjective  Chief Complaint  Follow up   HPI Hand paraesthesia : She had a prednisone taper last week and is feeling better, she still has some paresthesia on right finger tips of thumb and index of right side, but much better She is also on Lyrica bid   HTN: takingcoreg 25mg  daily, Benicar 40, hydralazine 25 mg TID ( down from 50 QID )  clonidine qhs. BP since the medication adjustment is better in the 120-130's/70-mid80's, except for once that was 90's/70's but did not happen again. She denies chest pain or palpitation Last labs showed a drop of GFR, she states not taking nsaid's otc, we will recheck level today  *** Patient Active Problem List   Diagnosis Date Noted  . Type 2 diabetes mellitus with complication, with long-term current use of insulin ([place]) 11/09/2018  . Mixed hyperlipidemia 11/09/2018  . Smoker 11/09/2018  . Atrial tachycardia ([place]) 08/25/2017  . Unstable angina ([place]) 05/01/2017  . Positive cardiac stress test 05/01/2017  . Abnormal EKG 04/24/2017  . Smoking history 04/24/2017  . Rotator cuff tendinitis, left 04/10/2017  . Cataracts, bilateral 11/25/2016  . Impingement syndrome of shoulder, left 10/13/2016  . Chronic bilateral low back pain without sciatica 08/20/2016  . Metabolic syndrome [id number]  . Mitral regurgitation 06/22/2015  . Melasma 12/21/2014  . History of hysterectomy 12/21/2014  . Restless leg syndrome 12/21/2014  . Benign essential HTN 12/17/2014  . Cataract 12/17/2014  . Dyslipidemia 12/17/2014  . Gastric reflux 12/17/2014  . History of cervical cancer 12/17/2014  . H/O iron deficiency anemia 12/17/2014  . Urinary incontinence in female 12/17/2014  . Dysmetabolic syndrome [id number]  . TI (tricuspid incompetence) 12/17/2014  . Osteopenia of the elderly 12/17/2014    Past Surgical History:  Procedure Laterality Date  . ABDOMINAL  HYSTERECTOMY  1976  . LEFT HEART CATH AND CORONARY ANGIOGRAPHY N/A 05/01/2017   Procedure: LEFT HEART CATH AND CORONARY ANGIOGRAPHY;  Surgeon: Minna Merritts, MD;  Location: [facility];  Service: Cardiovascular;  Laterality: N/A;  . OOPHORECTOMY      Family History  Problem Relation Age of Onset  . Heart failure Father   . Arrhythmia Father   . Hypertension Father   . Arrhythmia Sister   . Hypertension Sister   . Congestive Heart Failure Brother   . Hypertension Brother   . Cardiomyopathy Sister   . Diabetes Brother   . Breast cancer Paternal Aunt 45    Social History   Tobacco Use  . Smoking status: Former Smoker    Packs/day: 0.50    Years: 18.00    Pack years: 9.00    Types: Cigarettes    Start date: 06/23/1956    Quit date: 11/22/1995    Years since quitting: 24.7  . Smokeless tobacco: Former Systems developer    Quit date: 11/22/1996  . Tobacco comment: smoking cessation materials not required  Substance Use Topics  . Alcohol use: Yes    Alcohol/week: 0.0 standard drinks    Comment: socially     Current Outpatient Medications:  .  acetaminophen (TYLENOL) 500 MG tablet, Take 1 tablet (500 mg total) by mouth every 6 (six) hours as needed for headache., Disp: 30 tablet, Rfl: 0 .  aspirin 81 MG chewable tablet, Chew 1 tablet by mouth daily., Disp: , Rfl:  .  atorvastatin (LIPITOR) 40 MG  tablet, TAKE 1 TABLET BY MOUTH  DAILY, Disp: 90 tablet, Rfl: 3 .  carvedilol (COREG) 25 MG tablet, Take 1 tablet (25 mg total) by mouth 2 (two) times daily with a meal., Disp: 180 tablet, Rfl: 1 .  Cholecalciferol (VITAMIN D) 50 MCG (2000 UT) CAPS, Take 1 capsule by mouth daily., Disp: , Rfl:  .  cloNIDine (CATAPRES) 0.1 MG tablet, Take 1 tablet (0.1 mg total) by mouth every evening., Disp: 90 tablet, Rfl: 1 .  Cyanocobalamin (VITAMIN B-12) 2000 MCG TBCR, Take by mouth. , Disp: , Rfl:  .  diclofenac (VOLTAREN) 75 MG EC tablet, Take 75 mg by mouth 2 (two) times daily., Disp: , Rfl:  .   ferrous sulfate 325 (65 FE) MG tablet, Take 325 mg by mouth daily with breakfast., Disp: , Rfl:  .  fluticasone (FLONASE) 50 MCG/ACT nasal spray, USE 1 SPRAY IN EACH NOSTRIL TWO TIMES DAILY, Disp: 48 g, Rfl: 1 .  hydrALAZINE (APRESOLINE) 25 MG tablet, TAKE 1 TABLET BY MOUTH 3  TIMES DAILY, Disp: 270 tablet, Rfl: 1 .  isosorbide mononitrate (IMDUR) 30 MG 24 hr tablet, Take 1 tablet (30 mg total) by mouth daily. Please schedule office visit for further refills. Thank you!, Disp: 15 tablet, Rfl: 0 .  MAGNESIUM-ZINC PO, Take by mouth., Disp: , Rfl:  .  Multiple Vitamin (MULTIVITAMIN) capsule, Take 1 capsule by mouth daily., Disp: , Rfl:  .  olmesartan (BENICAR) 40 MG tablet, Take 1 tablet (40 mg total) by mouth daily., Disp: 90 tablet, Rfl: 1 .  oxybutynin (DITROPAN) 5 MG tablet, Take 1 tablet (5 mg total) by mouth 2 (two) times daily., Disp: 180 tablet, Rfl: 1 .  predniSONE (DELTASONE) 10 MG tablet, Take 10 mg by mouth daily., Disp: , Rfl:  .  pregabalin (LYRICA) 50 MG capsule, Take 1 capsule (50 mg total) by mouth 2 (two) times daily., Disp: 180 capsule, Rfl: 1 .  rOPINIRole (REQUIP) 2 MG tablet, Take 1 tablet (2 mg total) by mouth at bedtime., Disp: 90 tablet, Rfl: 1 .  SUPER B COMPLEX/C PO, Take by mouth., Disp: , Rfl:  .  tiZANidine (ZANAFLEX) 2 MG tablet, TAKE 1 TABLET BY MOUTH  EVERY 8 HOURS AS NEEDED FOR MUSCLE SPASM(S), Disp: 90 tablet, Rfl: 0 .  traMADol (ULTRAM) 50 MG tablet, Take 50 mg by mouth 4 (four) times daily as needed., Disp: , Rfl:  .  valACYclovir (VALTREX) 500 MG tablet, TAKE 1 TABLET BY MOUTH 2  TIMES DAILY. PER EPISODE AS NEEDED, TAKE FOR 10 DAYS (Patient taking differently: TAKE 1 TABLET BY MOUTH 2  TIMES DAILY AS NEEDED. PER EPISODE AS NEEDED, TAKE FOR 10 DAYS), Disp: 90 tablet, Rfl: 0 .  vitamin C (ASCORBIC ACID) 500 MG tablet, Take 500 mg by mouth daily., Disp: , Rfl:  .  Vitamins-Lipotropics (COMPLEX B-100-INOSITOL) TBCR, Take by mouth., Disp: , Rfl:   Allergies  Allergen  Reactions  . Amlodipine Swelling  . Meloxicam Palpitations    I personally reviewed {Reviewed:[id number]} with the patient/caregiver today.   ROS  ***  Objective  There were no vitals filed for this visit.  There is no height or weight on file to calculate BMI.  Physical Exam ***  No results found for this or any previous visit (from the past 2160 hour(s)).  Diabetic Foot Exam: Diabetic Foot Exam - Simple   No data filed    ***  PHQ2/9: Depression screen [facility] 2/9 04/30/2020 03/30/2020 03/22/2020 06/08/2019 03/18/2019  Decreased Interest  0 0 0 0 0  Down, Depressed, Hopeless 0 0 0 0 0  PHQ - 2 Score 0 0 0 0 0  Altered sleeping - - - 0 -  Tired, decreased energy - - - 0 -  Change in appetite - - - 0 -  Feeling bad or failure about yourself  - - - 0 -  Trouble concentrating - - - 0 -  Moving slowly or fidgety/restless - - - 0 -  Suicidal thoughts - - - 0 -  PHQ-9 Score - - - 0 -  Difficult doing work/chores - - - Not difficult at all -  Some recent data might be hidden    phq 9 is {gen pos [id number]} ***  Fall Risk: Fall Risk  04/30/2020 03/30/2020 03/22/2020 06/08/2019 03/18/2019  Falls in the past year? 0 0 0 0 0  Number falls in past yr: 0 0 0 0 0  Injury with Fall? 0 0 0 0 0  Comment - - - - -  Risk for fall due to : - - No Fall Risks - -  Risk for fall due to: Comment - - - - -  Follow up - - Falls prevention discussed - Falls prevention discussed   ***   Functional Status Survey:   ***   Assessment & Plan  *** There are no diagnoses linked to this encounter.

## 2020-07-31 ENCOUNTER — Ambulatory Visit: Payer: Medicare Other | Admitting: Family Medicine

## 2020-07-31 DIAGNOSIS — E1169 Type 2 diabetes mellitus with other specified complication: Secondary | ICD-10-CM

## 2020-07-31 NOTE — Progress Notes (Signed)
Name: Alyssa Lawson   MRN: [id number]    DOB: 28-Jul-1945   Date:08/01/2020       Progress Note  Subjective  Chief Complaint  Follow up   HPI   Hand paraesthesia : She was seen end of 2021 and was given prednisone and lyrica, symptoms improved but did not resolve, went to Ortho and was switched to gabapentin and also given diclofenac, she states right thumb has a constant numbness and tingling sensation, sometimes index finger also. She is going to [facility]. Explained diclofenac not good for her due to drop in GFR and we will recheck labs. He had a cervical spine x-ray that showed DDD C4 through T1  HTN: takingcoreg 25mg  daily, Benicar 40, hydralazine 25 mg TID ( down from 50 QID )  clonidine qhs. BP has been well controlled lately She denies chest pain but has occasional palpitation, she has follow up with Dr. [name] soon.  Last labs showed a drop of GFR, she is taking diclofenac and we will recheck labs today   Hyperlipidemia:last lipid panel was at goal, LDL was 66. She is taking atorvastatin and denies muscle aches. Unchanged   DMII: diagnosed with diabetes years ago, since 2015 A1C highest level was 6.4%,today is 6.3% %Shestopped taking metformin due to pt c/o swelling and weight gain with medication. Pt wastarted Trulicity back in [id number]/[id number] stopped because of cost. She is on diet only at this time.  She denies polyphagia, polydipsia or polyuria - she has a history of urinary incontinence but is stable.   Urinary incontinence: she has been on Ditropan, tolerating medication well, nocturia at most once at night,less frequent than it used to be. She is still doing Kegel exercisesbut has noticed some urine odor for the past few month   RLS: better with medication, sleeping well with medication,- Requip. No side effects   Chronic Back Pain: she has daily low back pain, She takes Tylenol prn, and Tizanidine   Unstable angina: indigestion symptoms with abnormal  EKG, normal cardiac cath done Nov 2018 by Dr. [name], on higher dose of Coreg, and Benicar also on Imdur and indigestionresolved, therefore symptoms controlled with medication management Also on statin therapy and aspirinShe denies current chest pain   GERD: she still has intermittent symptoms , she is taking Nexium otc prn, stable   Atrial tachycardia: states very seldom has palpitation,  she is on beta blocker .   Patient Active Problem List   Diagnosis Date Noted  . Type 2 diabetes mellitus with complication, with long-term current use of insulin ([place]) 11/09/2018  . Mixed hyperlipidemia 11/09/2018  . Smoker 11/09/2018  . Atrial tachycardia ([place]) 08/25/2017  . Unstable angina ([place]) 05/01/2017  . Positive cardiac stress test 05/01/2017  . Abnormal EKG 04/24/2017  . Smoking history 04/24/2017  . Rotator cuff tendinitis, left 04/10/2017  . Cataracts, bilateral 11/25/2016  . Impingement syndrome of shoulder, left 10/13/2016  . Chronic bilateral low back pain without sciatica 08/20/2016  . Metabolic syndrome 23/53/6144  . Mitral regurgitation 06/22/2015  . Melasma 12/21/2014  . History of hysterectomy 12/21/2014  . Restless leg syndrome 12/21/2014  . Benign essential HTN 12/17/2014  . Cataract 12/17/2014  . Dyslipidemia 12/17/2014  . Gastric reflux 12/17/2014  . History of cervical cancer 12/17/2014  . H/O iron deficiency anemia 12/17/2014  . Urinary incontinence in female 12/17/2014  . Dysmetabolic syndrome 31/54/0086  . TI (tricuspid incompetence) 12/17/2014  . Osteopenia of the elderly 12/17/2014    Past Surgical History:  Procedure Laterality Date  . ABDOMINAL HYSTERECTOMY  1976  . LEFT HEART CATH AND CORONARY ANGIOGRAPHY N/A 05/01/2017   Procedure: LEFT HEART CATH AND CORONARY ANGIOGRAPHY;  Surgeon: Minna Merritts, MD;  Location: [place] [facility];  Service: Cardiovascular;  Laterality: N/A;  . OOPHORECTOMY      Family History  Problem Relation Age of  Onset  . Heart failure Father   . Arrhythmia Father   . Hypertension Father   . Arrhythmia Sister   . Hypertension Sister   . Congestive Heart Failure Brother   . Hypertension Brother   . Cardiomyopathy Sister   . Diabetes Brother   . Breast cancer Paternal Aunt 36    Social History   Tobacco Use  . Smoking status: Former Smoker    Packs/day: 0.50    Years: 18.00    Pack years: 9.00    Types: Cigarettes    Start date: 06/23/1956    Quit date: 11/22/1995    Years since quitting: 24.7  . Smokeless tobacco: Former Systems developer    Quit date: 11/22/1996  . Tobacco comment: smoking cessation materials not required  Substance Use Topics  . Alcohol use: Yes    Alcohol/week: 0.0 standard drinks    Comment: socially     Current Outpatient Medications:  .  acetaminophen (TYLENOL) 500 MG tablet, Take 1 tablet (500 mg total) by mouth every 6 (six) hours as needed for headache., Disp: 30 tablet, Rfl: 0 .  aspirin 81 MG chewable tablet, Chew 1 tablet by mouth daily., Disp: , Rfl:  .  atorvastatin (LIPITOR) 40 MG tablet, TAKE 1 TABLET BY MOUTH  DAILY, Disp: 90 tablet, Rfl: 3 .  carvedilol (COREG) 25 MG tablet, Take 1 tablet (25 mg total) by mouth 2 (two) times daily with a meal., Disp: 180 tablet, Rfl: 1 .  Cholecalciferol (VITAMIN D) 50 MCG (2000 UT) CAPS, Take 1 capsule by mouth daily., Disp: , Rfl:  .  cloNIDine (CATAPRES) 0.1 MG tablet, Take 1 tablet (0.1 mg total) by mouth every evening., Disp: 90 tablet, Rfl: 1 .  Cyanocobalamin (VITAMIN B-12) 2000 MCG TBCR, Take by mouth. , Disp: , Rfl:  .  diclofenac (VOLTAREN) 75 MG EC tablet, Take 75 mg by mouth 2 (two) times daily., Disp: , Rfl:  .  ferrous sulfate 325 (65 FE) MG tablet, Take 325 mg by mouth daily with breakfast., Disp: , Rfl:  .  fluticasone (FLONASE) 50 MCG/ACT nasal spray, USE 1 SPRAY IN EACH NOSTRIL TWO TIMES DAILY, Disp: 48 g, Rfl: 1 .  gabapentin (NEURONTIN) 300 MG capsule, Take 300 mg by mouth in the morning and at bedtime., Disp: ,  Rfl:  .  hydrALAZINE (APRESOLINE) 25 MG tablet, TAKE 1 TABLET BY MOUTH 3  TIMES DAILY, Disp: 270 tablet, Rfl: 1 .  isosorbide mononitrate (IMDUR) 30 MG 24 hr tablet, Take 1 tablet (30 mg total) by mouth daily. Please schedule office visit for further refills. Thank you!, Disp: 15 tablet, Rfl: 0 .  MAGNESIUM-ZINC PO, Take by mouth., Disp: , Rfl:  .  Multiple Vitamin (MULTIVITAMIN) capsule, Take 1 capsule by mouth daily., Disp: , Rfl:  .  SUPER B COMPLEX/C PO, Take by mouth., Disp: , Rfl:  .  tiZANidine (ZANAFLEX) 2 MG tablet, TAKE 1 TABLET BY MOUTH  EVERY 8 HOURS AS NEEDED FOR MUSCLE SPASM(S), Disp: 90 tablet, Rfl: 0 .  valACYclovir (VALTREX) 500 MG tablet, TAKE 1 TABLET BY MOUTH 2  TIMES DAILY. PER EPISODE AS NEEDED, TAKE  FOR 10 DAYS, Disp: 90 tablet, Rfl: 0 .  vitamin C (ASCORBIC ACID) 500 MG tablet, Take 500 mg by mouth daily., Disp: , Rfl:  .  Vitamins-Lipotropics (COMPLEX B-100-INOSITOL) TBCR, Take by mouth., Disp: , Rfl:  .  olmesartan (BENICAR) 40 MG tablet, Take 1 tablet (40 mg total) by mouth daily., Disp: 90 tablet, Rfl: 1 .  oxybutynin (DITROPAN) 5 MG tablet, Take 1 tablet (5 mg total) by mouth 2 (two) times daily., Disp: 180 tablet, Rfl: 1 .  rOPINIRole (REQUIP) 2 MG tablet, Take 1 tablet (2 mg total) by mouth at bedtime., Disp: 90 tablet, Rfl: 1  Allergies  Allergen Reactions  . Amlodipine Swelling  . Meloxicam Palpitations    I personally reviewed active problem list, medication list, allergies, family history, social history, health maintenance with the patient/caregiver today.   ROS  Constitutional: Negative for fever or weight change.  Respiratory: Negative for cough and shortness of breath.   Cardiovascular: Negative for chest pain , positive for intermittent palpitations.  Gastrointestinal: Negative for abdominal pain, no bowel changes.  Musculoskeletal: Negative for gait problem or joint swelling.  Skin: Negative for rash.  Neurological: Negative for dizziness or  headache.  No other specific complaints in a complete review of systems (except as listed in HPI above).  Objective  Vitals:   08/01/20 1337  BP: 128/88  Pulse: 73  Resp: 16  Temp: 98.6 F (37 C)  TempSrc: Oral  SpO2: 95%  Weight: 165 lb 6.4 oz (75 kg)  Height: 5\' 4"  (1.626 m)    Body mass index is 28.39 kg/m.  Physical Exam  Constitutional: Patient appears well-developed and well-nourished. Overweight.  No distress.  HEENT: head atraumatic, normocephalic, pupils equal and reactive to light,  neck supple Cardiovascular: Normal rate, regular rhythm and normal heart sounds.  2/6 SE murmur heard. No BLE edema. Pulmonary/Chest: Effort normal and breath sounds normal. No respiratory distress. Abdominal: Soft.  There is no tenderness. Psychiatric: Patient has a normal mood and affect. behavior is normal. Judgment and thought content normal.  Recent Results (from the past 2160 hour(s))  POCT HgB A1C     Status: Abnormal   Collection Time: 08/01/20  1:41 PM  Result Value Ref Range   Hemoglobin A1C 6.3 (A) 4.0 - 5.6 %   HbA1c POC (<> result, manual entry)     HbA1c, POC (prediabetic range)     HbA1c, POC (controlled diabetic range)       PHQ2/9: Depression screen [facility] 2/9 08/01/2020 04/30/2020 03/30/2020 03/22/2020 06/08/2019  Decreased Interest 0 0 0 0 0  Down, Depressed, Hopeless 1 0 0 0 0  PHQ - 2 Score 1 0 0 0 0  Altered sleeping 0 - - - 0  Tired, decreased energy 0 - - - 0  Change in appetite 0 - - - 0  Feeling bad or failure about yourself  0 - - - 0  Trouble concentrating 0 - - - 0  Moving slowly or fidgety/restless 0 - - - 0  Suicidal thoughts 0 - - - 0  PHQ-9 Score 1 - - - 0  Difficult doing work/chores - - - - Not difficult at all  Some recent data might be hidden    phq 9 is positive   Fall Risk: Fall Risk  08/01/2020 04/30/2020 03/30/2020 03/22/2020 06/08/2019  Falls in the past year? 0 0 0 0 0  Number falls in past yr: 0 0 0 0 0  Injury with Fall? 0 0  0 0 0   Comment - - - - -  Risk for fall due to : - - - No Fall Risks -  Risk for fall due to: Comment - - - - -  Follow up - - - Falls prevention discussed -    Functional Status Survey: Is the patient deaf or have difficulty hearing?: No Does the patient have difficulty seeing, even when wearing glasses/contacts?: No Does the patient have difficulty concentrating, remembering, or making decisions?: No Does the patient have difficulty walking or climbing stairs?: No Does the patient have difficulty dressing or bathing?: No Does the patient have difficulty doing errands alone such as visiting a doctor's office or shopping?: No   Assessment & Plan   1. Dyslipidemia associated with type 2 diabetes mellitus (HCC)  - POCT HgB A1C  2. Benign essential HTN  - olmesartan (BENICAR) 40 MG tablet; Take 1 tablet (40 mg total) by mouth daily.  Dispense: 90 tablet; Refill: 1 - COMPLETE METABOLIC PANEL WITH GFR  3. Urge incontinence  - oxybutynin (DITROPAN) 5 MG tablet; Take 1 tablet (5 mg total) by mouth 2 (two) times daily.  Dispense: 180 tablet; Refill: 1  4. RLS (restless legs syndrome)  - rOPINIRole (REQUIP) 2 MG tablet; Take 1 tablet (2 mg total) by mouth at bedtime.  Dispense: 90 tablet; Refill: 1  5. Atrial tachycardia (HCC)  Rate controlled  6. Unstable angina ([place])   7. Chronic bilateral low back pain without sciatica   8. Dyslipidemia   9. Decreased calculated GFR  - COMPLETE METABOLIC PANEL WITH GFR  10. Abnormal urine odor  - CULTURE, URINE COMPREHENSIVE  11. Dysthymia  Husband has dementia

## 2020-08-01 ENCOUNTER — Ambulatory Visit (INDEPENDENT_AMBULATORY_CARE_PROVIDER_SITE_OTHER): Payer: Medicare Other | Admitting: Family Medicine

## 2020-08-01 ENCOUNTER — Other Ambulatory Visit: Payer: Self-pay

## 2020-08-01 ENCOUNTER — Encounter: Payer: Self-pay | Admitting: Family Medicine

## 2020-08-01 VITALS — BP 128/88 | HR 73 | Temp 98.6°F | Resp 16 | Ht 64.0 in | Wt 165.4 lb

## 2020-08-01 DIAGNOSIS — N3941 Urge incontinence: Secondary | ICD-10-CM | POA: Diagnosis not present

## 2020-08-01 DIAGNOSIS — G8929 Other chronic pain: Secondary | ICD-10-CM | POA: Diagnosis not present

## 2020-08-01 DIAGNOSIS — E1169 Type 2 diabetes mellitus with other specified complication: Secondary | ICD-10-CM

## 2020-08-01 DIAGNOSIS — I471 Supraventricular tachycardia: Secondary | ICD-10-CM | POA: Diagnosis not present

## 2020-08-01 DIAGNOSIS — G2581 Restless legs syndrome: Secondary | ICD-10-CM | POA: Diagnosis not present

## 2020-08-01 DIAGNOSIS — I10 Essential (primary) hypertension: Secondary | ICD-10-CM

## 2020-08-01 DIAGNOSIS — I200 Unstable angina: Secondary | ICD-10-CM | POA: Diagnosis not present

## 2020-08-01 DIAGNOSIS — E785 Hyperlipidemia, unspecified: Secondary | ICD-10-CM | POA: Diagnosis not present

## 2020-08-01 DIAGNOSIS — R8290 Unspecified abnormal findings in urine: Secondary | ICD-10-CM | POA: Diagnosis not present

## 2020-08-01 DIAGNOSIS — F341 Dysthymic disorder: Secondary | ICD-10-CM

## 2020-08-01 DIAGNOSIS — R944 Abnormal results of kidney function studies: Secondary | ICD-10-CM

## 2020-08-01 DIAGNOSIS — I4719 Other supraventricular tachycardia: Secondary | ICD-10-CM

## 2020-08-01 DIAGNOSIS — M545 Low back pain, unspecified: Secondary | ICD-10-CM

## 2020-08-01 LAB — POCT GLYCOSYLATED HEMOGLOBIN (HGB A1C): Hemoglobin A1C: 6.3 % — AB (ref 4.0–5.6)

## 2020-08-01 MED ORDER — OLMESARTAN MEDOXOMIL 40 MG PO TABS: 40.0000 mg | ORAL_TABLET | Freq: Every day | ORAL | 1 refills | Status: DC

## 2020-08-01 MED ORDER — OXYBUTYNIN CHLORIDE 5 MG PO TABS: 5.0000 mg | ORAL_TABLET | Freq: Two times a day (BID) | ORAL | 1 refills | Status: DC

## 2020-08-01 MED ORDER — ROPINIROLE HCL 2 MG PO TABS: 2.0000 mg | ORAL_TABLET | Freq: Every day | ORAL | 1 refills | Status: DC

## 2020-08-02 LAB — COMPLETE METABOLIC PANEL WITH GFR
AG Ratio: 1.8 (calc) (ref 1.0–2.5)
ALT: 23 U/L (ref 6–29)
AST: 20 U/L (ref 10–35)
Albumin: 4.5 g/dL (ref 3.6–5.1)
Alkaline phosphatase (APISO): 59 U/L (ref 37–153)
BUN: 23 mg/dL (ref 7–25)
CO2: 28 mmol/L (ref 20–32)
Calcium: 9.9 mg/dL (ref 8.6–10.4)
Chloride: 106 mmol/L (ref 98–110)
Creat: 0.89 mg/dL (ref 0.60–0.93)
GFR, Est African American: 74 mL/min/{1.73_m2} (ref >=60)
GFR, Est Non African American: 64 mL/min/{1.73_m2} (ref >=60)
Globulin: 2.5 g/dL (calc) (ref 1.9–3.7)
Glucose, Bld: 104 mg/dL — ABNORMAL HIGH (ref 65–99)
Potassium: 4.6 mmol/L (ref 3.5–5.3)
Sodium: 141 mmol/L (ref 135–146)
Total Bilirubin: 0.6 mg/dL (ref 0.2–1.2)
Total Protein: 7 g/dL (ref 6.1–8.1)

## 2020-08-02 LAB — CULTURE, URINE COMPREHENSIVE
MICRO NUMBER:: 11514977
SPECIMEN QUALITY:: ADEQUATE

## 2020-08-24 ENCOUNTER — Telehealth: Payer: Self-pay | Admitting: Cardiovascular Disease

## 2020-08-24 MED ORDER — ISOSORBIDE MONONITRATE ER 30 MG PO TB24: 30.0000 mg | ORAL_TABLET | Freq: Every day | ORAL | 0 refills | Status: DC

## 2020-08-24 NOTE — Telephone Encounter (Signed)
*  STAT* If patient is at the pharmacy, call can be transferred to refill team.   1. Which medications need to be refilled? (please list name of each medication and dose if known) isosorbide 30 mg  2. Which pharmacy/location (including street and city if local pharmacy) is medication to be sent to? [facility] in [place]  3. Do they need a 30 day or 90 day supply? 5-10, patient comes in on 3/18 and needs some to get her to then

## 2020-09-05 ENCOUNTER — Other Ambulatory Visit: Payer: Self-pay

## 2020-09-05 ENCOUNTER — Ambulatory Visit
Admission: RE | Admit: 2020-09-05 | Discharge: 2020-09-05 | Disposition: A | Discharge status: Home / Self Care | Payer: Medicare Other | Source: Ambulatory Visit | Attending: Family Medicine | Admitting: Family Medicine

## 2020-09-05 DIAGNOSIS — Z1231 Encounter for screening mammogram for malignant neoplasm of breast: Secondary | ICD-10-CM

## 2020-09-07 ENCOUNTER — Other Ambulatory Visit: Payer: Self-pay

## 2020-09-07 ENCOUNTER — Ambulatory Visit: Payer: Medicare Other | Admitting: Cardiovascular Disease

## 2020-09-07 ENCOUNTER — Encounter: Payer: Self-pay | Admitting: Cardiovascular Disease

## 2020-09-07 VITALS — BP 160/80 | HR 69 | Ht 64.0 in | Wt 162.0 lb

## 2020-09-07 DIAGNOSIS — E782 Mixed hyperlipidemia: Secondary | ICD-10-CM | POA: Diagnosis not present

## 2020-09-07 DIAGNOSIS — F17200 Nicotine dependence, unspecified, uncomplicated: Secondary | ICD-10-CM | POA: Diagnosis not present

## 2020-09-07 DIAGNOSIS — I471 Supraventricular tachycardia: Secondary | ICD-10-CM

## 2020-09-07 DIAGNOSIS — E118 Type 2 diabetes mellitus with unspecified complications: Secondary | ICD-10-CM

## 2020-09-07 DIAGNOSIS — I10 Essential (primary) hypertension: Secondary | ICD-10-CM

## 2020-09-07 DIAGNOSIS — Z794 Long term (current) use of insulin: Secondary | ICD-10-CM

## 2020-09-07 NOTE — Progress Notes (Signed)
Date:  09/07/2020   ID:  Alyssa Lawson, DOB 04-22-1946, MRN [id number]  Patient Location:  [place] [address]   Provider location:   [facility], [place] office  PCP:  Steele Sizer, MD  Cardiologist:  Patsy Baltimore  Chief Complaint  Patient presents with  . Follow-up     History of Present Illness:    Alyssa Lawson is a 75 y.o. female  past medical history of HTN Hyperlipidemia:  Metabolic Syndrome last [id number]  5.6% , down from 6.3,  Smoked, quit 1998, started age 37, 26 years, <1 ppd Cardiac catheterization November 2018, no significant disease Who presents for f/u of her abnormal EKG , chest pain, mitral valve regurgitation  In follow-up today she reports having some recent stressors at home Husband with dementia, dx last aug 2021, going on before that Has some supports, neighbors that can help Disrupting her sleep, he is having some hallucinations  In general is active Has an exercise bike, likes to spend time in the garden No SOB on exertion  Prior echocardiogram reviewed with her showing mild to moderate MR Denies ankle swelling No PND orthopnea No chest pain, no significant tachycardia  Labs reviewed HBA1C 6.3 Total chol 133  EKG personally reviewed by myself on todays visit Shows normal sinus rhythm rate 69 bpm T wave abnormality V3 through V6, inferior leads, seen previously   Prior CV studies:   The following studies were reviewed today:  Stress test 04/2017 Pharmacological myocardial perfusion imaging study with large region of ischemia in the mid to apical anteroseptal wall and entire lateral wall Normal wall motion, EF estimated at 63% No EKG changes concerning for ischemia at peak stress or in recovery. Poor exercise tolerance, 3: 30 min GI uptake artifact noted High risk scan  Cath 05/01/2017 False positive stress test  MR noted Atrial tachycardia after the case, given  metoprolol IV  Echo 05/01/2017 Mild to moderate MR EF >55 Small to moderate pericardial effusion   Past Medical History:  Diagnosis Date  . Bladder disorder    a. urinary incontinence  . Cancer ([place])    cervical ca  . Coronary artery disease, non-occlusive    a. LHC 05/01/2017: LM no sig dz, LAD no sig dz, LCx no sig dz, RCA no sig dz, EF 55-65%, mod MR  . Endometriosis   . GERD (gastroesophageal reflux disease)   . Hyperlipidemia   . Hypertension   . Mitral regurgitation    a. TTE 05/01/2017: EF 55-60%, no RWMA, not technically sufficient to allow for LV diastolic function, mild to moderate mitral regurgitation, mildly dilated left atrium, RV systolic function normal, PASP 35 mmHg, small to moderate pericardial effusion was noted  . Restless leg syndrome    Past Surgical History:  Procedure Laterality Date  . ABDOMINAL HYSTERECTOMY  1976  . LEFT HEART CATH AND CORONARY ANGIOGRAPHY N/A 05/01/2017   Procedure: LEFT HEART CATH AND CORONARY ANGIOGRAPHY;  Surgeon: Minna Merritts, MD;  Location: [place] [facility];  Service: Cardiovascular;  Laterality: N/A;  . OOPHORECTOMY       No outpatient medications have been marked as taking for the 09/07/20 encounter (Appointment) with Minna Merritts, MD.     Allergies:   Amlodipine and Meloxicam   Social History   Tobacco Use  . Smoking status: Former Smoker    Packs/day: 0.50    Years: 18.00    Pack years:  9.00    Types: Cigarettes    Start date: 06/23/1956    Quit date: 11/22/1995    Years since quitting: 24.8  . Smokeless tobacco: Former Systems developer    Quit date: 11/22/1996  . Tobacco comment: smoking cessation materials not required  Vaping Use  . Vaping Use: Never used  Substance Use Topics  . Alcohol use: Yes    Alcohol/week: 0.0 standard drinks    Comment: socially  . Drug use: No     Current Outpatient Medications on File Prior to Visit  Medication Sig Dispense Refill  . acetaminophen (TYLENOL) 500 MG tablet Take 1  tablet (500 mg total) by mouth every 6 (six) hours as needed for headache. 30 tablet 0  . aspirin 81 MG chewable tablet Chew 1 tablet by mouth daily.    Marland Kitchen atorvastatin (LIPITOR) 40 MG tablet TAKE 1 TABLET BY MOUTH  DAILY 90 tablet 3  . carvedilol (COREG) 25 MG tablet Take 1 tablet (25 mg total) by mouth 2 (two) times daily with a meal. 180 tablet 1  . Cholecalciferol (VITAMIN D) 50 MCG (2000 UT) CAPS Take 1 capsule by mouth daily.    . cloNIDine (CATAPRES) 0.1 MG tablet Take 1 tablet (0.1 mg total) by mouth every evening. 90 tablet 1  . Cyanocobalamin (VITAMIN B-12) 2000 MCG TBCR Take by mouth.     . diclofenac (VOLTAREN) 75 MG EC tablet Take 75 mg by mouth 2 (two) times daily.    . ferrous sulfate 325 (65 FE) MG tablet Take 325 mg by mouth daily with breakfast.    . fluticasone (FLONASE) 50 MCG/ACT nasal spray USE 1 SPRAY IN EACH NOSTRIL TWO TIMES DAILY 48 g 1  . gabapentin (NEURONTIN) 300 MG capsule Take 300 mg by mouth in the morning and at bedtime.    . hydrALAZINE (APRESOLINE) 25 MG tablet TAKE 1 TABLET BY MOUTH 3  TIMES DAILY 270 tablet 1  . isosorbide mononitrate (IMDUR) 30 MG 24 hr tablet Take 1 tablet (30 mg total) by mouth daily. Please schedule office visit for further refills. Thank you! 15 tablet 0  . MAGNESIUM-ZINC PO Take by mouth.    . Multiple Vitamin (MULTIVITAMIN) capsule Take 1 capsule by mouth daily.    Marland Kitchen olmesartan (BENICAR) 40 MG tablet Take 1 tablet (40 mg total) by mouth daily. 90 tablet 1  . oxybutynin (DITROPAN) 5 MG tablet Take 1 tablet (5 mg total) by mouth 2 (two) times daily. 180 tablet 1  . rOPINIRole (REQUIP) 2 MG tablet Take 1 tablet (2 mg total) by mouth at bedtime. 90 tablet 1  . SUPER B COMPLEX/C PO Take by mouth.    Marland Kitchen tiZANidine (ZANAFLEX) 2 MG tablet TAKE 1 TABLET BY MOUTH  EVERY 8 HOURS AS NEEDED FOR MUSCLE SPASM(S) 90 tablet 0  . valACYclovir (VALTREX) 500 MG tablet TAKE 1 TABLET BY MOUTH 2  TIMES DAILY. PER EPISODE AS NEEDED, TAKE FOR 10 DAYS 90 tablet 0   . vitamin C (ASCORBIC ACID) 500 MG tablet Take 500 mg by mouth daily.    . Vitamins-Lipotropics (COMPLEX B-100-INOSITOL) TBCR Take by mouth.     No current facility-administered medications on file prior to visit.     Family Hx: The patient's family history includes Arrhythmia in her father and sister; Breast cancer (age of onset: 38) in her paternal aunt; Cardiomyopathy in her sister; Congestive Heart Failure in her brother; Diabetes in her brother; Heart failure in her father; Hypertension in her brother, father, and  sister.  ROS:   Please see the history of present illness.    Review of Systems  Constitutional: Negative.   HENT: Negative.   Respiratory: Negative.   Cardiovascular: Negative.   Gastrointestinal: Negative.   Musculoskeletal: Negative.   Neurological: Negative.   Psychiatric/Behavioral: Negative.   All other systems reviewed and are negative.    Labs/Other Tests and Data Reviewed:    Recent Labs: 03/30/2020: Hemoglobin 13.6; Platelets 187 08/01/2020: ALT 23; BUN 23; Creat 0.89; Potassium 4.6; Sodium 141   Recent Lipid Panel Lab Results  Component Value Date/Time   CHOL 133 03/30/2020 02:20 PM   CHOL 225 (H) 06/22/2015 12:32 PM   TRIG 59 03/30/2020 02:20 PM   HDL 67 03/30/2020 02:20 PM   HDL 76 06/22/2015 12:32 PM   CHOLHDL 2.0 03/30/2020 02:20 PM   LDLCALC 52 03/30/2020 02:20 PM    Wt Readings from Last 3 Encounters:  08/01/20 165 lb 6.4 oz (75 kg)  04/30/20 170 lb (77.1 kg)  03/30/20 164 lb 8 oz (74.6 kg)     Exam:    BP (!) 160/80 (BP Location: Left Arm, Patient Position: Sitting, Cuff Size: Normal)   Pulse 69   Ht 5\' 4"  (1.626 m)   Wt 162 lb (73.5 kg)   SpO2 96%   BMI 27.81 kg/m  Constitutional:  oriented to person, place, and time. No distress.  HENT:  Head: Grossly normal Eyes:  no discharge. No scleral icterus.  Neck: No JVD, no carotid bruits  Cardiovascular: Regular rate and rhythm, 2/6 SEM extending from right sternal border to left  apex murmurs appreciated Pulmonary/Chest: Clear to auscultation bilaterally, no wheezes or rails Abdominal: Soft.  no distension.  no tenderness.  Musculoskeletal: Normal range of motion Neurological:  normal muscle tone. Coordination normal. No atrophy Skin: Skin warm and dry Psychiatric: normal affect, pleasant  ASSESSMENT & PLAN:    Atrial tachycardia (HCC) Continue current medications, she denies any significant breakthrough symptoms  Type 2 diabetes mellitus with complication, with long-term current use of insulin (HCC) A1c stable, recommended strict diet, walking program  Mixed hyperlipidemia Cholesterol is at goal on the current lipid regimen. No changes to the medications were made.  Smoker Remote smoker, has not smoked in many years  Benign essential HTN Elevated in the office today, reports it is well controlled at home Anxious coming into the office, also significant stressors at home, husband with dementia She will continue to monitor blood pressure at home and call us if it runs high  Adjustment disorder Some stressors with husband at home with dementia, some hallucinations, interrupting her sleep Discussed strategies for management   Total encounter time more than 25 minutes  Greater than 50% was spent in counseling and coordination of care with the patient   Signed, Ida Rogue, MD  09/07/2020 [place] Office [address], [place], [place] [address]

## 2020-09-07 NOTE — Patient Instructions (Signed)
Generic for zyrtec: cetirazine   Medication Instructions:  No changes  If you need a refill on your cardiac medications before your next appointment, please call your pharmacy.    Lab work: No new labs needed   If you have labs (blood work) drawn today and your tests are completely normal, you will receive your results only by: Marland Kitchen MyChart Message (if you have MyChart) OR . A paper copy in the mail If you have any lab test that is abnormal or we need to change your treatment, we will call you to review the results.   Testing/Procedures: No new testing needed   Follow-Up: At [facility], you and your health needs are our priority.  As part of our continuing mission to provide you with exceptional heart care, we have created designated Provider Care Teams.  These Care Teams include your primary Cardiologist (physician) and Advanced Practice Providers (APPs -  Physician Assistants and Nurse Practitioners) who all work together to provide you with the care you need, when you need it.  . You will need a follow up appointment in 12 months  . Providers on your designated Care Team:   . Murray Hodgkins, NP . Christell Faith, PA-C . Marrianne Mood, PA-C  Any Other Special Instructions Will Be Listed Below (If Applicable).  COVID-19 Vaccine Information can be found at: [email or web address].uk For questions related to vaccine distribution or appointments, please email vaccine@ .[email or web address] or call [phone].

## 2020-09-11 ENCOUNTER — Other Ambulatory Visit: Payer: Self-pay

## 2020-09-11 DIAGNOSIS — I10 Essential (primary) hypertension: Secondary | ICD-10-CM

## 2020-09-11 MED ORDER — HYDRALAZINE HCL 25 MG PO TABS: 25.0000 mg | ORAL_TABLET | Freq: Three times a day (TID) | ORAL | 3 refills | Status: DC

## 2020-09-11 MED ORDER — ISOSORBIDE MONONITRATE ER 30 MG PO TB24: 30.0000 mg | ORAL_TABLET | Freq: Every day | ORAL | 3 refills | Status: DC

## 2020-09-11 MED ORDER — OLMESARTAN MEDOXOMIL 40 MG PO TABS: 40.0000 mg | ORAL_TABLET | Freq: Every day | ORAL | 3 refills | Status: DC

## 2020-09-11 MED ORDER — CARVEDILOL 25 MG PO TABS: 25.0000 mg | ORAL_TABLET | Freq: Two times a day (BID) | ORAL | 3 refills | Status: DC

## 2020-09-11 MED ORDER — CLONIDINE HCL 0.1 MG PO TABS: 0.1000 mg | ORAL_TABLET | Freq: Every evening | ORAL | 3 refills | Status: DC

## 2020-10-09 DIAGNOSIS — S63641A Sprain of metacarpophalangeal joint of right thumb, initial encounter: Secondary | ICD-10-CM | POA: Diagnosis not present

## 2020-10-09 DIAGNOSIS — M1811 Unilateral primary osteoarthritis of first carpometacarpal joint, right hand: Secondary | ICD-10-CM | POA: Diagnosis not present

## 2020-11-21 ENCOUNTER — Other Ambulatory Visit: Payer: Self-pay

## 2020-11-21 ENCOUNTER — Encounter: Payer: Self-pay | Admitting: Family Medicine

## 2020-11-21 ENCOUNTER — Ambulatory Visit (INDEPENDENT_AMBULATORY_CARE_PROVIDER_SITE_OTHER): Payer: Medicare Other | Admitting: Family Medicine

## 2020-11-21 VITALS — BP 140/78 | HR 75 | Temp 98.1°F | Resp 16 | Ht 64.0 in | Wt 158.0 lb

## 2020-11-21 DIAGNOSIS — Z636 Dependent relative needing care at home: Secondary | ICD-10-CM | POA: Diagnosis not present

## 2020-11-21 DIAGNOSIS — I471 Supraventricular tachycardia: Secondary | ICD-10-CM | POA: Diagnosis not present

## 2020-11-21 DIAGNOSIS — E785 Hyperlipidemia, unspecified: Secondary | ICD-10-CM

## 2020-11-21 DIAGNOSIS — E1169 Type 2 diabetes mellitus with other specified complication: Secondary | ICD-10-CM

## 2020-11-21 DIAGNOSIS — N3941 Urge incontinence: Secondary | ICD-10-CM | POA: Diagnosis not present

## 2020-11-21 DIAGNOSIS — I200 Unstable angina: Secondary | ICD-10-CM

## 2020-11-21 DIAGNOSIS — M1811 Unilateral primary osteoarthritis of first carpometacarpal joint, right hand: Secondary | ICD-10-CM

## 2020-11-21 DIAGNOSIS — G2581 Restless legs syndrome: Secondary | ICD-10-CM

## 2020-11-21 DIAGNOSIS — I10 Essential (primary) hypertension: Secondary | ICD-10-CM | POA: Diagnosis not present

## 2020-11-21 DIAGNOSIS — I4719 Other supraventricular tachycardia: Secondary | ICD-10-CM

## 2020-11-21 LAB — POCT GLYCOSYLATED HEMOGLOBIN (HGB A1C): Hemoglobin A1C: 5.9 % — AB (ref 4.0–5.6)

## 2020-11-21 MED ORDER — DICLOFENAC SODIUM 1 % EX GEL: 2.0000 g | Freq: Four times a day (QID) | CUTANEOUS | 2 refills | Status: DC

## 2020-11-21 MED ORDER — ROPINIROLE HCL 2 MG PO TABS
2.0000 mg | ORAL_TABLET | Freq: Every day | ORAL | 1 refills | Status: DC
Start: 2020-11-21 — End: 2021-04-05

## 2020-11-21 NOTE — Progress Notes (Addendum)
Name: Alyssa Lawson   MRN: [id number]    DOB: 28-Jul-1945   Date:11/21/2020       Progress Note  Subjective  Chief Complaint  Follow Up  HPI  Hand paraesthesia : She was seen end of 2021 and was given prednisone and lyrica, symptoms improved but did not resolve, went to Ortho and was switched to gabapentin and also given diclofenac, she states right thumb has a constant numbness and tingling sensation, sometimes index finger also. She is going to [facility]. Explained diclofenac not good for her due to drop in GFR and we will recheck labs. He had a cervical spine x-ray that showed DDD C4 through T1 . She stopped taking gabapentin since last visit with me since it did not help either.   HTN: takingcoreg 25mg  daily, Benicar 40, hydralazine 25 mg TID ( down from 50 QID )  clonidine qhs. BP has been well controlled a home, it spiked during a recent visit with Ortho. She denies chest pain or recent episodes of palpitation  Hyperlipidemia:last lipid panel was at goal, LDL was 52. She is taking atorvastatin and denies muscle aches. Unchanged   DMII: diagnosed with diabetes years ago, since 2015 A1C highest level was 6.4%,today is down to 5.9 %Shestopped taking metformin due to pt c/o swelling and weight gain with medication. Pt wastarted Trulicity back in 28/3151VOH stopped because of cost. She is on diet only at this time, last visit A1C had gone up again to 6.3 % but she has changed her diet, eating smaller portions, eating a lot of sea food, baked chicken . She denies polyphagia, polydipsia or polyuria - she has a history of urinary incontinence but is stable   Urinary incontinence: she has been on Ditropan, tolerating medication well, nocturia at most once at nigh. She stopped doing Kegel exercises, she states symptoms are better   RLS: better with medication, sleeping well with medication,- Requip. No side effects . Needs refills.   Chronic Back Pain: she has daily low back  pain, She takes Tylenol prn, and Tizanidine   Unstable angina: indigestion symptoms with abnormal EKG, normal cardiac cath done Nov 2018 by Dr. [name], on higher dose of Coreg, and Benicar also on Imdur and indigestionresolved, therefore symptoms controlled with medication management Also on statin therapy and aspirinShe denies current chest pain   GERD:  she states since she changed her diet, eating by 5 pm, smaller portions and states symptoms have improved, taking Nexium less than once a week, advised to switch to Tums or Pepcid    Atrial tachycardia: no recent episodes of palpitation ,  she is on beta blocker and is rate controlled    Right thumb pain: seen by Ortho, was placed on celebrex and tramadol but states did not improve symptoms, she has daily pain on right thumb, explained risk of medication, advised to use topical voltaren   Stress: she states her husband has dementia, is paranoid, blames her for everything and she is starting to get resentful.   Patient Active Problem List   Diagnosis Date Noted  . Type 2 diabetes mellitus with complication, with long-term current use of insulin ([place]) 11/09/2018  . Mixed hyperlipidemia 11/09/2018  . Smoker 11/09/2018  . Atrial tachycardia ([place]) 08/25/2017  . Unstable angina ([place]) 05/01/2017  . Positive cardiac stress test 05/01/2017  . Abnormal EKG 04/24/2017  . Smoking history 04/24/2017  . Rotator cuff tendinitis, left 04/10/2017  . Cataracts, bilateral 11/25/2016  . Impingement syndrome of shoulder,  left 10/13/2016  . Chronic bilateral low back pain without sciatica 08/20/2016  . Metabolic syndrome 77/82/4235  . Mitral regurgitation 06/22/2015  . Melasma 12/21/2014  . History of hysterectomy 12/21/2014  . Restless leg syndrome 12/21/2014  . Benign essential HTN 12/17/2014  . Cataract 12/17/2014  . Dyslipidemia 12/17/2014  . Gastric reflux 12/17/2014  . History of cervical cancer 12/17/2014  . H/O iron deficiency anemia  12/17/2014  . Urinary incontinence in female 12/17/2014  . Dysmetabolic syndrome 36/14/4315  . TI (tricuspid incompetence) 12/17/2014  . Osteopenia of the elderly 12/17/2014    Past Surgical History:  Procedure Laterality Date  . ABDOMINAL HYSTERECTOMY  1976  . CARDIAC CATHETERIZATION    . LEFT HEART CATH AND CORONARY ANGIOGRAPHY N/A 05/01/2017   Procedure: LEFT HEART CATH AND CORONARY ANGIOGRAPHY;  Surgeon: Minna Merritts, MD;  Location: [facility];  Service: Cardiovascular;  Laterality: N/A;  . OOPHORECTOMY      Family History  Problem Relation Age of Onset  . Heart failure Father   . Arrhythmia Father   . Hypertension Father   . Arrhythmia Sister   . Hypertension Sister   . Congestive Heart Failure Brother   . Hypertension Brother   . Cardiomyopathy Sister   . Diabetes Brother   . Breast cancer Paternal Aunt 24    Social History   Tobacco Use  . Smoking status: Former Smoker    Packs/day: 0.50    Years: 18.00    Pack years: 9.00    Types: Cigarettes    Start date: 06/23/1956    Quit date: 11/22/1995    Years since quitting: 25.0  . Smokeless tobacco: Former Systems developer    Quit date: 11/22/1996  . Tobacco comment: smoking cessation materials not required  Substance Use Topics  . Alcohol use: Yes    Alcohol/week: 0.0 standard drinks    Comment: socially     Current Outpatient Medications:  .  acetaminophen (TYLENOL) 500 MG tablet, Take 1 tablet (500 mg total) by mouth every 6 (six) hours as needed for headache., Disp: 30 tablet, Rfl: 0 .  aspirin 81 MG chewable tablet, Chew 1 tablet by mouth daily., Disp: , Rfl:  .  atorvastatin (LIPITOR) 40 MG tablet, TAKE 1 TABLET BY MOUTH  DAILY, Disp: 90 tablet, Rfl: 3 .  carvedilol (COREG) 25 MG tablet, Take 1 tablet (25 mg total) by mouth 2 (two) times daily with a meal., Disp: 180 tablet, Rfl: 3 .  Cholecalciferol (VITAMIN D) 50 MCG (2000 UT) CAPS, Take 1 capsule by mouth daily., Disp: , Rfl:  .  cloNIDine (CATAPRES)  0.1 MG tablet, Take 1 tablet (0.1 mg total) by mouth every evening., Disp: 90 tablet, Rfl: 3 .  Cyanocobalamin (VITAMIN B-12) 2000 MCG TBCR, Take by mouth daily., Disp: , Rfl:  .  ferrous sulfate 325 (65 FE) MG tablet, Take 325 mg by mouth daily with breakfast., Disp: , Rfl:  .  fluticasone (FLONASE) 50 MCG/ACT nasal spray, USE 1 SPRAY IN EACH NOSTRIL TWO TIMES DAILY, Disp: 48 g, Rfl: 1 .  hydrALAZINE (APRESOLINE) 25 MG tablet, Take 1 tablet (25 mg total) by mouth 3 (three) times daily., Disp: 347 tablet, Rfl: 3 .  isosorbide mononitrate (IMDUR) 30 MG 24 hr tablet, Take 1 tablet (30 mg total) by mouth daily. Please schedule office visit for further refills. Thank you!, Disp: 90 tablet, Rfl: 3 .  MAGNESIUM-ZINC PO, Take by mouth daily., Disp: , Rfl:  .  Multiple Vitamin (MULTIVITAMIN) capsule,  Take 1 capsule by mouth daily., Disp: , Rfl:  .  olmesartan (BENICAR) 40 MG tablet, Take 1 tablet (40 mg total) by mouth daily., Disp: 90 tablet, Rfl: 3 .  oxybutynin (DITROPAN) 5 MG tablet, Take 1 tablet (5 mg total) by mouth 2 (two) times daily., Disp: 180 tablet, Rfl: 1 .  rOPINIRole (REQUIP) 2 MG tablet, Take 1 tablet (2 mg total) by mouth at bedtime., Disp: 90 tablet, Rfl: 1 .  tiZANidine (ZANAFLEX) 2 MG tablet, TAKE 1 TABLET BY MOUTH  EVERY 8 HOURS AS NEEDED FOR MUSCLE SPASM(S), Disp: 90 tablet, Rfl: 0 .  valACYclovir (VALTREX) 500 MG tablet, TAKE 1 TABLET BY MOUTH 2  TIMES DAILY. PER EPISODE AS NEEDED, TAKE FOR 10 DAYS, Disp: 90 tablet, Rfl: 0 .  vitamin C (ASCORBIC ACID) 500 MG tablet, Take 500 mg by mouth daily., Disp: , Rfl:  .  gabapentin (NEURONTIN) 300 MG capsule, Take 300 mg by mouth in the morning and at bedtime. (Patient not taking: Reported on 11/21/2020), Disp: , Rfl:   Allergies  Allergen Reactions  . Amlodipine Swelling  . Meloxicam Palpitations    I personally reviewed active problem list, medication list, allergies, family history, social history, health maintenance with the  patient/caregiver today.   ROS  Constitutional: Negative for fever or weight change.  Respiratory: Negative for cough and shortness of breath.   Cardiovascular: Negative for chest pain or palpitations.  Gastrointestinal: Negative for abdominal pain, no bowel changes.  Musculoskeletal: Negative for gait problem or joint swelling.  Skin: Negative for rash.  Neurological: Negative for dizziness or headache.  No other specific complaints in a complete review of systems (except as listed in HPI above).  Objective  Vitals:   11/21/20 1325  BP: 140/78  Pulse: 75  Resp: 16  Temp: 98.1 F (36.7 C)  TempSrc: Oral  SpO2: 96%  Weight: 158 lb (71.7 kg)  Height: 5\' 4"  (1.626 m)    Body mass index is 27.12 kg/m.  Physical Exam  Constitutional: Patient appears well-developed and well-nourished No distress.  HEENT: head atraumatic, normocephalic, pupils equal and reactive to light,  neck supple Cardiovascular: Normal rate, regular rhythm and normal heart sounds.  1 plus systolic  murmur heard. No BLE edema. Pulmonary/Chest: Effort normal and breath sounds normal. No respiratory distress. Abdominal: Soft.  There is no tenderness. Muscular Skeletal: hypertrophy of mcp thumb right and tender with rom, no redness  Psychiatric: Patient has a normal mood and affect. behavior is normal. Judgment and thought content normal.  Recent Results (from the past 2160 hour(s))  POCT HgB A1C     Status: Abnormal   Collection Time: 11/21/20  1:29 PM  Result Value Ref Range   Hemoglobin A1C 5.9 (A) 4.0 - 5.6 %   HbA1c POC (<> result, manual entry)     HbA1c, POC (prediabetic range)     HbA1c, POC (controlled diabetic range)      PHQ2/9: Depression screen [employer] 2/9 11/21/2020 08/01/2020 04/30/2020 03/30/2020 03/22/2020  Decreased Interest 0 0 0 0 0  Down, Depressed, Hopeless 0 1 0 0 0  PHQ - 2 Score 0 1 0 0 0  Altered sleeping - 0 - - -  Tired, decreased energy - 0 - - -  Change in appetite - 0 - - -   Feeling bad or failure about yourself  - 0 - - -  Trouble concentrating - 0 - - -  Moving slowly or fidgety/restless - 0 - - -  Suicidal thoughts - 0 - - -  PHQ-9 Score - 1 - - -  Difficult doing work/chores - - - - -  Some recent data might be hidden    phq 9 is negative   Fall Risk: Fall Risk  11/21/2020 08/01/2020 04/30/2020 03/30/2020 03/22/2020  Falls in the past year? 0 0 0 0 0  Number falls in past yr: 0 0 0 0 0  Injury with Fall? 0 0 0 0 0  Comment - - - - -  Risk for fall due to : - - - - No Fall Risks  Risk for fall due to: Comment - - - - -  Follow up - - - - Falls prevention discussed    Functional Status Survey: Is the patient deaf or have difficulty hearing?: No Does the patient have difficulty seeing, even when wearing glasses/contacts?: No Does the patient have difficulty concentrating, remembering, or making decisions?: No Does the patient have difficulty walking or climbing stairs?: No Does the patient have difficulty dressing or bathing?: No Does the patient have difficulty doing errands alone such as visiting a doctor's office or shopping?: No   Assessment & Plan  1. Dyslipidemia associated with type 2 diabetes mellitus (HCC)  - POCT HgB A1C  2. Unstable angina (HCC)  Controlled   3. Atrial tachycardia ([place])  Keep follow up with Dr. [name]  4. Urge incontinence  Stable   5. Benign essential HTN  Continue current medications  6. RLS (restless legs syndrome)  - rOPINIRole (REQUIP) 2 MG tablet; Take 1 tablet (2 mg total) by mouth at bedtime.  Dispense: 90 tablet; Refill: 1  7. Primary osteoarthritis of first carpometacarpal joint of right hand  - diclofenac Sodium (VOLTAREN) 1 % GEL; Apply 2 g topically 4 (four) times daily.  Dispense: 100 g; Refill: 2  8. Caregiver stress  - AMB Referral to [place]

## 2020-11-21 NOTE — Patient Instructions (Signed)
Please check shingrix vaccine with pharmacy

## 2020-11-22 ENCOUNTER — Encounter: Payer: Self-pay | Admitting: Family Medicine

## 2020-11-22 ENCOUNTER — Telehealth: Payer: Self-pay | Admitting: *Deleted

## 2020-11-22 NOTE — Chronic Care Management (AMB) (Signed)
  Chronic Care Management   Outreach Note  11/22/2020 Name: Alyssa Lawson MRN: [id number] DOB: 08-May-1946  Alyssa Lawson is a 75 y.o. year old female who is a primary care patient of Steele Sizer, MD. I reached out to Alyssa Lawson by phone today in response to a referral sent by Ms. Micheal Likens Speagle's PCP, Dr. Ancil Boozer.     An unsuccessful telephone outreach was attempted today. The patient was referred to the case management team for assistance with care management and care coordination.   Follow Up Plan: A HIPAA compliant phone message was left for the patient providing contact information and requesting a return call. The care management team will reach out to the patient again over the next 7 days. If patient returns call to provider office, please advise to call [place] at [phone].  [place] [facility]

## 2020-11-22 NOTE — Chronic Care Management (AMB) (Signed)
  Chronic Care Management   Note  11/22/2020 Name: [name] MRN: [id number] DOB: 1946-03-30  Alyssa Lawson is a 75 y.o. year old female who is a primary care patient of Steele Sizer, MD. I reached out to Alyssa Lawson by phone today in response to a referral sent by Ms. Micheal Likens Riling's PCP, Dr. Ancil Boozer.     Alyssa Lawson was given information about Chronic Care Management services today including:  1. CCM service includes personalized support from designated clinical staff supervised by her physician, including individualized plan of care and coordination with other care providers 2. 24/7 contact phone numbers for assistance for urgent and routine care needs. 3. Service will only be billed when office clinical staff spend 20 minutes or more in a month to coordinate care. 4. Only one practitioner may furnish and bill the service in a calendar month. 5. The patient may stop CCM services at any time (effective at the end of the month) by phone call to the office staff. 6. The patient will be responsible for cost sharing (co-pay) of up to 20% of the service fee (after annual deductible is met).  Patient agreed to services and verbal consent obtained.   Follow up plan: Telephone appointment with care management team member scheduled for:11/30/2020  [facility]

## 2020-11-30 ENCOUNTER — Telehealth: Payer: Medicare Other | Admitting: *Deleted

## 2020-11-30 ENCOUNTER — Encounter: Payer: Self-pay | Admitting: Family Medicine

## 2020-12-03 NOTE — Telephone Encounter (Signed)
Please clarify with patient.

## 2020-12-11 ENCOUNTER — Ambulatory Visit
Admission: EM | Admit: 2020-12-11 | Discharge: 2020-12-11 | Disposition: A | Discharge status: Home / Self Care | Payer: Medicare Other | Attending: Family Medicine | Admitting: Family Medicine

## 2020-12-11 ENCOUNTER — Other Ambulatory Visit: Payer: Self-pay

## 2020-12-11 ENCOUNTER — Encounter: Payer: Self-pay | Admitting: Emergency Medicine

## 2020-12-11 DIAGNOSIS — M5010 Cervical disc disorder with radiculopathy, unspecified cervical region: Secondary | ICD-10-CM

## 2020-12-11 NOTE — Discharge Instructions (Addendum)
Call your PCP to discuss referral to neurosurgery.  Given everything you tried, I believe you need an MRI and discussion about injections.  Try PT.  Take care  Dr. Lacinda Axon

## 2020-12-11 NOTE — ED Triage Notes (Signed)
Pt c/o right shoulder pain that radiates down to her right hand. She states she has had a pinched nerve for about a year. She states the pain has gotten worse and her thumb feels numb.

## 2020-12-11 NOTE — ED Provider Notes (Signed)
[facility]    CSN: [id number] Arrival date & time: 12/11/20  1230      History   Chief Complaint Chief Complaint  Patient presents with   Shoulder Pain    right    HPI  75 year old female presents with the above complaint.  Patient states that she has had pain which radiates down from her neck to her right hand.  She has some numbness and tingling.  She also has pain discretely at the [facility] joint of the right thumb.  She states that her pain has gotten worse.  She rates her pain as 10/10 in severity.  She is well-appearing and is in no distress at this time.  She seems very comfortable.  She has seen orthopedics and she has seen her primary care physician.  She has not responded to several medications.  She has had imaging but no appreciable MRI.     Past Medical History:  Diagnosis Date   Bladder disorder    a. urinary incontinence   Cancer ([place])    cervical ca   Coronary artery disease, non-occlusive    a. LHC 05/01/2017: LM no sig dz, LAD no sig dz, LCx no sig dz, RCA no sig dz, EF 55-65%, mod MR   Endometriosis    GERD (gastroesophageal reflux disease)    Hyperlipidemia    Hypertension    Mitral regurgitation    a. TTE 05/01/2017: EF 55-60%, no RWMA, not technically sufficient to allow for LV diastolic function, mild to moderate mitral regurgitation, mildly dilated left atrium, RV systolic function normal, PASP 35 mmHg, small to moderate pericardial effusion was noted   Restless leg syndrome     Patient Active Problem List   Diagnosis Date Noted   Type 2 diabetes mellitus with complication, with long-term current use of insulin ([place]) 11/09/2018   Mixed hyperlipidemia 11/09/2018   Smoker 11/09/2018   Atrial tachycardia ([place]) 08/25/2017   Unstable angina ([place]) 05/01/2017   Positive cardiac stress test 05/01/2017   Abnormal EKG 04/24/2017   Smoking history 04/24/2017   Rotator cuff tendinitis, left 04/10/2017   Cataracts, bilateral 11/25/2016    Impingement syndrome of shoulder, left 10/13/2016   Chronic bilateral low back pain without sciatica 39/76/7341   Metabolic syndrome 93/79/0240   Mitral regurgitation 06/22/2015   Melasma 12/21/2014   History of hysterectomy 12/21/2014   Restless leg syndrome 12/21/2014   Benign essential HTN 12/17/2014   Cataract 12/17/2014   Dyslipidemia 12/17/2014   Gastric reflux 12/17/2014   History of cervical cancer 12/17/2014   H/O iron deficiency anemia 12/17/2014   Urinary incontinence in female 97/35/3299   Dysmetabolic syndrome 24/26/8341   TI (tricuspid incompetence) 12/17/2014   Osteopenia of the elderly 12/17/2014    Past Surgical History:  Procedure Laterality Date   ABDOMINAL HYSTERECTOMY  1976   CARDIAC CATHETERIZATION     LEFT HEART CATH AND CORONARY ANGIOGRAPHY N/A 05/01/2017   Procedure: LEFT HEART CATH AND CORONARY ANGIOGRAPHY;  Surgeon: Minna Merritts, MD;  Location: [facility];  Service: Cardiovascular;  Laterality: N/A;   OOPHORECTOMY      OB History   No obstetric history on file.      Home Medications    Prior to Admission medications   Medication Sig Start Date End Date Taking? Authorizing Provider  acetaminophen (TYLENOL) 500 MG tablet Take 1 tablet (500 mg total) by mouth every 6 (six) hours as needed for headache. 11/20/14  Yes Betancourt, Aura Fey, NP  aspirin  81 MG chewable tablet Chew 1 tablet by mouth daily. 01/23/11  Yes [provider]  atorvastatin (LIPITOR) 40 MG tablet TAKE 1 TABLET BY MOUTH  DAILY 03/21/20  Yes Sowles, Drue Stager, MD  carvedilol (COREG) 25 MG tablet Take 1 tablet (25 mg total) by mouth 2 (two) times daily with a meal. 09/11/20  Yes Gollan, Kathlene November, MD  Cholecalciferol (VITAMIN D) 50 MCG (2000 UT) CAPS Take 1 capsule by mouth daily.   Yes [provider]  cloNIDine (CATAPRES) 0.1 MG tablet Take 1 tablet (0.1 mg total) by mouth every evening. 09/11/20  Yes Gollan, Kathlene November, MD  Cyanocobalamin (VITAMIN B-12) 2000  MCG TBCR Take by mouth daily.   Yes [provider]  diclofenac Sodium (VOLTAREN) 1 % GEL Apply 2 g topically 4 (four) times daily. 11/21/20  Yes Sowles, Drue Stager, MD  ferrous sulfate 325 (65 FE) MG tablet Take 325 mg by mouth daily with breakfast.   Yes [provider]  fluticasone (FLONASE) 50 MCG/ACT nasal spray USE 1 SPRAY IN EACH NOSTRIL TWO TIMES DAILY 03/30/20  Yes Sowles, Drue Stager, MD  hydrALAZINE (APRESOLINE) 25 MG tablet Take 1 tablet (25 mg total) by mouth 3 (three) times daily. 09/11/20  Yes Minna Merritts, MD  isosorbide mononitrate (IMDUR) 30 MG 24 hr tablet Take 1 tablet (30 mg total) by mouth daily. Please schedule office visit for further refills. Thank you! 09/11/20  Yes Minna Merritts, MD  MAGNESIUM-ZINC PO Take by mouth daily.   Yes [provider]  Multiple Vitamin (MULTIVITAMIN) capsule Take 1 capsule by mouth daily.   Yes [provider]  olmesartan (BENICAR) 40 MG tablet Take 1 tablet (40 mg total) by mouth daily. 09/11/20  Yes Minna Merritts, MD  oxybutynin (DITROPAN) 5 MG tablet Take 1 tablet (5 mg total) by mouth 2 (two) times daily. 08/01/20  Yes Sowles, Drue Stager, MD  rOPINIRole (REQUIP) 2 MG tablet Take 1 tablet (2 mg total) by mouth at bedtime. 11/21/20  Yes Sowles, Drue Stager, MD  tiZANidine (ZANAFLEX) 2 MG tablet TAKE 1 TABLET BY MOUTH  EVERY 8 HOURS AS NEEDED FOR MUSCLE SPASM(S) 07/24/20  Yes Sowles, Drue Stager, MD  valACYclovir (VALTREX) 500 MG tablet TAKE 1 TABLET BY MOUTH 2  TIMES DAILY. PER EPISODE AS NEEDED, TAKE FOR 10 DAYS 05/25/16  Yes Sowles, Drue Stager, MD  vitamin C (ASCORBIC ACID) 500 MG tablet Take 500 mg by mouth daily.   Yes [provider]    Family History Family History  Problem Relation Age of Onset   Heart failure Father    Arrhythmia Father    Hypertension Father    Arrhythmia Sister    Hypertension Sister    Congestive Heart Failure Brother    Hypertension Brother    Cardiomyopathy Sister    Diabetes  Brother    Breast cancer Paternal Aunt 60    Social History Social History   Tobacco Use   Smoking status: Former    Packs/day: 0.50    Years: 18.00    Pack years: 9.00    Types: Cigarettes    Start date: 06/23/1956    Quit date: 11/22/1995    Years since quitting: 25.0   Smokeless tobacco: Former    Quit date: 11/22/1996   Tobacco comments:    smoking cessation materials not required  Vaping Use   Vaping Use: Never used  Substance Use Topics   Alcohol use: Yes    Alcohol/week: 0.0 standard drinks    Comment: socially  Drug use: No     Allergies   Amlodipine and Meloxicam   Review of Systems Review of Systems Per HPI  Physical Exam Triage Vital Signs ED Triage Vitals  Enc Vitals Group     BP 12/11/20 1259 (!) 141/72     Pulse Rate 12/11/20 1259 60     Resp 12/11/20 1259 18     Temp 12/11/20 1259 98 F (36.7 C)     Temp Source 12/11/20 1259 Oral     SpO2 12/11/20 1259 98 %     Weight 12/11/20 1256 158 lb 1.1 oz (71.7 kg)     Height 12/11/20 1256 5\' 4"  (1.626 m)     Head Circumference --      Peak Flow --      Pain Score 12/11/20 1256 10     Pain Loc --      Pain Edu? --      Excl. in [place]? --   \ Updated Vital Signs BP (!) 141/72 (BP Location: Left Arm)   Pulse 60   Temp 98 F (36.7 C) (Oral)   Resp 18   Ht 5\' 4"  (1.626 m)   Wt 71.7 kg   SpO2 98%   BMI 27.13 kg/m   Visual Acuity Right Eye Distance:   Left Eye Distance:   Bilateral Distance:    Right Eye Near:   Left Eye Near:    Bilateral Near:     Physical Exam Constitutional:      General: She is not in acute distress.    Appearance: Normal appearance. She is not ill-appearing.  HENT:     Head: Normocephalic and atraumatic.  Eyes:     General:        Right eye: No discharge.        Left eye: No discharge.     Conjunctiva/sclera: Conjunctivae normal.  Cardiovascular:     Rate and Rhythm: Normal rate and regular rhythm.     Heart sounds: Murmur heard.  Pulmonary:     Effort:  Pulmonary effort is normal.     Breath sounds: Normal breath sounds. No wheezing, rhonchi or rales.  Musculoskeletal:     Comments: No significant tenderness over the [facility] joint of the right thumb.  Neurological:     Mental Status: She is alert.  Psychiatric:        Mood and Affect: Mood normal.        Behavior: Behavior normal.     UC Treatments / Results  Labs (all labs ordered are listed, but only abnormal results are displayed) Labs Reviewed - No data to display  EKG   Radiology No results found.  Procedures Procedures (including critical care time)  Medications Ordered in UC Medications - No data to display  Initial Impression / Assessment and Plan / UC Course  I have reviewed the triage vital signs and the nursing notes.  Pertinent labs & imaging results that were available during my care of the patient were reviewed by me and considered in my medical decision making (see chart for details).    75 year old female presents with cervical radiculopathy and patient also has arthritis of the [facility] joint.  Both of these problems are chronic in nature.  She has been evaluated by her primary care physician as well as her orthopedist.  She has been treated with Lyrica, tramadol, Celebrex, topical NSAIDs, corticosteroids and has had no improvement.  I advised her that she needs to see neurosurgery for further evaluation  and management.  She needs an MRI of her cervical spine.  She states that she has never been to physical therapy.  Referral placed in epic to PT.  I do not recommend narcotic pain medication.  Advised supportive care.  Follow-up with PCP.  Final Clinical Impressions(s) / UC Diagnoses   Final diagnoses:  Cervical disc disorder with radiculopathy of cervical region     Discharge Instructions      Call your PCP to discuss referral to neurosurgery.  Given everything you tried, I believe you need an MRI and discussion about injections.  Try PT.  Take  care  Dr. Lacinda Axon    ED Prescriptions   None    PDMP not reviewed this encounter.   Coral Spikes, [place] 12/11/20 1642

## 2020-12-17 ENCOUNTER — Telehealth: Payer: Self-pay | Admitting: *Deleted

## 2020-12-17 NOTE — Chronic Care Management (AMB) (Signed)
  Care Management   Note  12/17/2020 Name: [name] MRN: [id number] DOB: May 09, 1946  Alyssa Lawson is a 75 y.o. year old female who is a primary care patient of Steele Sizer, MD and is actively engaged with the care management team. I reached out to Alyssa Lawson by phone today to assist with re-scheduling an initial visit with the Licensed Clinical Social Worker  Follow up plan: Unsuccessful telephone outreach attempt made. A HIPAA compliant phone message was left for the patient providing contact information and requesting a return call.  The care management team will reach out to the patient again over the next 7 days.  If patient returns call to provider office, please advise to call [place] at [phone].  [place] Management

## 2020-12-18 NOTE — Chronic Care Management (AMB) (Signed)
  Care Management   Note  12/18/2020 Name: [name] MRN: [id number] DOB: 05-06-1946  Alyssa Lawson is a 75 y.o. year old female who is a primary care patient of Steele Sizer, MD and is actively engaged with the care management team. I reached out to Alyssa Lawson by phone today to assist with re-scheduling an initial visit with the Licensed Clinical Social Worker  Follow up plan: Unsuccessful telephone outreach attempt made. A HIPAA compliant phone message was left for the patient providing contact information and requesting a return call.  The care management team will reach out to the patient again over the next 7 days.  If patient returns call to provider office, please advise to call [place] at [phone].  [place] Management

## 2020-12-19 ENCOUNTER — Other Ambulatory Visit: Payer: Self-pay | Admitting: Family Medicine

## 2020-12-19 DIAGNOSIS — N3941 Urge incontinence: Secondary | ICD-10-CM

## 2020-12-25 NOTE — Chronic Care Management (AMB) (Signed)
  Care Management   Note  12/25/2020 Name: [name] MRN: [id number] DOB: [birth year]  Alyssa Lawson is a 75 y.o. year old female who is a primary care patient of Steele Sizer, MD and is actively engaged with the care management team. I reached out to Alyssa Lawson by phone today to assist with re-scheduling an initial visit with the Licensed Clinical Social Worker  Follow up plan: Patient declines further follow up and engagement by the care management team. Appropriate care team members and provider have been notified via electronic communication. The care management team is available to follow up with the patient after provider conversation with the patient regarding recommendation for care management engagement and subsequent re-referral to the care management team. The patient has been provided with contact information for the care management team and has been advised to call with any health related questions or concerns.   [place] Management

## 2021-01-07 ENCOUNTER — Telehealth: Payer: Self-pay | Admitting: Emergency Medicine

## 2021-01-07 NOTE — Telephone Encounter (Signed)
Patient was seen at Urgent Care in June and sent to Physical Therapy. Never received appointment from them. Can you put this order in again under your name. Would like to got to Physical Therapy on [address]. You have seen her for this prior and had seen Reche Dixon. Patient prefer not to go back to him

## 2021-01-08 ENCOUNTER — Other Ambulatory Visit: Payer: Self-pay | Admitting: Family Medicine

## 2021-01-08 DIAGNOSIS — M5412 Radiculopathy, cervical region: Secondary | ICD-10-CM

## 2021-01-09 NOTE — Telephone Encounter (Signed)
[facility] referral clerk notified to send order

## 2021-01-16 DIAGNOSIS — M542 Cervicalgia: Secondary | ICD-10-CM | POA: Diagnosis not present

## 2021-01-23 DIAGNOSIS — M542 Cervicalgia: Secondary | ICD-10-CM | POA: Diagnosis not present

## 2021-01-30 DIAGNOSIS — M542 Cervicalgia: Secondary | ICD-10-CM | POA: Diagnosis not present

## 2021-02-04 ENCOUNTER — Encounter: Payer: Self-pay | Admitting: Family Medicine

## 2021-02-06 DIAGNOSIS — M542 Cervicalgia: Secondary | ICD-10-CM | POA: Diagnosis not present

## 2021-02-20 ENCOUNTER — Other Ambulatory Visit: Payer: Self-pay | Admitting: Family Medicine

## 2021-02-20 DIAGNOSIS — E785 Hyperlipidemia, unspecified: Secondary | ICD-10-CM

## 2021-02-20 NOTE — Telephone Encounter (Signed)
Requested Prescriptions  Pending Prescriptions Disp Refills  . atorvastatin (LIPITOR) 40 MG tablet [Pharmacy Med Name: Atorvastatin Calcium 40 MG Oral Tablet] 90 tablet 2    Sig: TAKE 1 TABLET BY MOUTH  DAILY     Cardiovascular:  Antilipid - Statins Passed - 02/20/2021 10:21 PM      Passed - Total Cholesterol in normal range and within 360 days    Cholesterol, Total  Date Value Ref Range Status  06/22/2015 225 (H) 100 - 199 mg/dL Final   Cholesterol  Date Value Ref Range Status  03/30/2020 133 <200 mg/dL Final         Passed - LDL in normal range and within 360 days    LDL Cholesterol (Calc)  Date Value Ref Range Status  03/30/2020 52 mg/dL (calc) Final    Comment:    Reference range: <100 . Desirable range <100 mg/dL for primary prevention;   <70 mg/dL for patients with CHD or diabetic patients  with > or = 2 CHD risk factors. Marland Kitchen LDL-C is now calculated using the Martin-Hopkins  calculation, which is a validated novel method providing  better accuracy than the Friedewald equation in the  estimation of LDL-C.  Cresenciano Genre et al. Annamaria Helling. [id number]): 2061-2068  (http://education.[employer].com/faq/FAQ164)          Passed - HDL in normal range and within 360 days    HDL  Date Value Ref Range Status  03/30/2020 67 > OR = 50 mg/dL Final  06/22/2015 76 >39 mg/dL Final         Passed - Triglycerides in normal range and within 360 days    Triglycerides  Date Value Ref Range Status  03/30/2020 59 <150 mg/dL Final         Passed - Patient is not pregnant      Passed - Valid encounter within last 12 months    Recent Outpatient Visits          3 months ago Dyslipidemia associated with type 2 diabetes mellitus [facility])   [facility] [place], Drue Stager, MD   6 months ago Dyslipidemia associated with type 2 diabetes mellitus [facility])   [facility] Steele Sizer, MD   9 months ago Benign essential HTN   [facility]  [place], Drue Stager, MD   10 months ago Dyslipidemia associated with type 2 diabetes mellitus [facility])   [facility] Steele Sizer, MD   1 year ago Chronic right shoulder pain   [facility] Steele Sizer, MD      Future Appointments            In 1 month  [facility], [place]   In 1 month Steele Sizer, MD [facility], [facility]

## 2021-03-04 ENCOUNTER — Encounter: Payer: Self-pay | Admitting: Family Medicine

## 2021-03-26 ENCOUNTER — Ambulatory Visit: Payer: Medicare Other

## 2021-03-29 ENCOUNTER — Ambulatory Visit: Payer: Medicare Other | Admitting: Family Medicine

## 2021-04-05 ENCOUNTER — Other Ambulatory Visit: Payer: Self-pay

## 2021-04-05 ENCOUNTER — Encounter: Payer: Self-pay | Admitting: Family Medicine

## 2021-04-05 ENCOUNTER — Ambulatory Visit (INDEPENDENT_AMBULATORY_CARE_PROVIDER_SITE_OTHER): Payer: Medicare Other | Admitting: Family Medicine

## 2021-04-05 VITALS — BP 116/70 | HR 78 | Temp 98.1°F | Resp 16 | Ht 64.0 in | Wt 158.0 lb

## 2021-04-05 DIAGNOSIS — G8929 Other chronic pain: Secondary | ICD-10-CM | POA: Insufficient documentation

## 2021-04-05 DIAGNOSIS — I10 Essential (primary) hypertension: Secondary | ICD-10-CM | POA: Diagnosis not present

## 2021-04-05 DIAGNOSIS — E785 Hyperlipidemia, unspecified: Secondary | ICD-10-CM

## 2021-04-05 DIAGNOSIS — Z23 Encounter for immunization: Secondary | ICD-10-CM

## 2021-04-05 DIAGNOSIS — E1169 Type 2 diabetes mellitus with other specified complication: Secondary | ICD-10-CM | POA: Diagnosis not present

## 2021-04-05 DIAGNOSIS — I200 Unstable angina: Secondary | ICD-10-CM

## 2021-04-05 DIAGNOSIS — M1811 Unilateral primary osteoarthritis of first carpometacarpal joint, right hand: Secondary | ICD-10-CM | POA: Diagnosis not present

## 2021-04-05 DIAGNOSIS — G2581 Restless legs syndrome: Secondary | ICD-10-CM

## 2021-04-05 DIAGNOSIS — M5412 Radiculopathy, cervical region: Secondary | ICD-10-CM | POA: Diagnosis not present

## 2021-04-05 DIAGNOSIS — M545 Low back pain, unspecified: Secondary | ICD-10-CM

## 2021-04-05 DIAGNOSIS — I471 Supraventricular tachycardia: Secondary | ICD-10-CM

## 2021-04-05 DIAGNOSIS — N3941 Urge incontinence: Secondary | ICD-10-CM

## 2021-04-05 DIAGNOSIS — M79644 Pain in right finger(s): Secondary | ICD-10-CM

## 2021-04-05 DIAGNOSIS — D692 Other nonthrombocytopenic purpura: Secondary | ICD-10-CM

## 2021-04-05 DIAGNOSIS — M5030 Other cervical disc degeneration, unspecified cervical region: Secondary | ICD-10-CM | POA: Diagnosis not present

## 2021-04-05 LAB — POCT GLYCOSYLATED HEMOGLOBIN (HGB A1C): Hemoglobin A1C: 6.1 % — AB (ref 4.0–5.6)

## 2021-04-05 MED ORDER — PREGABALIN 50 MG PO CAPS: 50.0000 mg | ORAL_CAPSULE | Freq: Every evening | ORAL | 0 refills | Status: DC

## 2021-04-05 MED ORDER — TIZANIDINE HCL 2 MG PO TABS: ORAL_TABLET | ORAL | 0 refills | Status: DC

## 2021-04-05 MED ORDER — ROPINIROLE HCL 2 MG PO TABS: 2.0000 mg | ORAL_TABLET | Freq: Every day | ORAL | 1 refills | Status: DC

## 2021-04-05 NOTE — Progress Notes (Signed)
Name: Alyssa Lawson   MRN: [id number]    DOB: [birth year]   Date:04/05/2021       Progress Note  Subjective  Chief Complaint  Follow Up  HPI    HTN: taking coreg 25mg  daily, Benicar 40 , hydralazine 25 mg TID ( down from 50 QID )  clonidine qhs. BP has been well controlled a home, bp is at goal today.  She denies chest pain or recent episodes of palpitation  Hyperlipidemia: last lipid panel was at goal, LDL was 52 . She is taking atorvastatin and denies muscle aches. We will recheck it today    DMII: diagnosed with diabetes years ago, since 2015  She stopped taking metformin due to pt c/o swelling and weight gain with medication. Pt was tarted Trulicity back in 41/3244 but stopped because of cost. She is on diet only at this time, last visit A1C had gone up again to 6.3 % but she has changed her diet, and today is down to 6.1 %    Urinary incontinence: she has been on Ditropan , tolerating medication well, nocturia at most once at nigh. She stopped doing Kegel exercises, she states symptoms are stable    RLS: better with medication, sleeping well with medication,- Requip. Unchanged   Chronic Back Pain: she has daily low back pain, She takes Tylenol prn, and Tizanidine, unchanged     Unstable angina: indigestion symptoms with abnormal EKG, normal cardiac cath done Nov 2018 by Dr. [name], on higher dose of Coreg, and Benicar also on Imdur and indigestion resolved, therefore symptoms controlled with medication management   Also on statin therapy and aspirin  She is doing well, no chest pain in a while, managed medically    GERD:  she states since she changed her diet, eating by 5 pm, smaller portions and states symptoms have improved, taking Nexium prn only   Atrial tachycardia: no recent episodes of palpitation ,  she is on beta blocker and is rate controlled  , no problems. Lately    Right thumb pain: seen by Ortho, was placed on celebrex and tramadol but states did not improve symptoms,  she has daily pain on right thumb, explained risk of medication, advised to use topical voltaren   Stress: she states her husband has dementia, is paranoid, blames her for everything and, she states his sister arrived from [place] in September and will stay until Nov and has been spending a lot of time with him.   Cervical radiculitis: she went to PT at [facility] , she states Tizanidine helps with neck spasms. She had C-spine x-ray that showed DDD. Marland Kitchen No tingling down her arm, but has intermittent tingling on carpal tunnel distribution every night. She states gabapentin did not work, we will try lyrica at least at night   Right thumb arthritis: went to [facility], last visit April 2022, she was given a thumb splint also celebrex and Tramadol and advised to go back for -xray if no improvement, she states pain is constant , daily and right now 8/10, described as aching, and numb on tip of right thumb. . Worse with rom or when she tries to squeeze anything with right hand.     Patient Active Problem List   Diagnosis Date Noted   Type 2 diabetes mellitus with complication, with long-term current use of insulin ([place]) 11/09/2018   Mixed hyperlipidemia 11/09/2018   Smoker 11/09/2018   Atrial tachycardia ([place]) 08/25/2017   Unstable angina ([place]) 05/01/2017  Positive cardiac stress test 05/01/2017   Abnormal EKG 04/24/2017   Smoking history 04/24/2017   Rotator cuff tendinitis, left 04/10/2017   Cataracts, bilateral 11/25/2016   Impingement syndrome of shoulder, left 10/13/2016   Chronic bilateral low back pain without sciatica 24/58/0998   Metabolic syndrome 33/82/5053   Mitral regurgitation 06/22/2015   Melasma 12/21/2014   History of hysterectomy 12/21/2014   Restless leg syndrome 12/21/2014   Benign essential HTN 12/17/2014   Cataract 12/17/2014   Dyslipidemia 12/17/2014   Gastric reflux 12/17/2014   History of cervical cancer 12/17/2014   H/O iron deficiency anemia 12/17/2014    Urinary incontinence in female 97/67/3419   Dysmetabolic syndrome 37/90/2409   TI (tricuspid incompetence) 12/17/2014   Osteopenia of the elderly 12/17/2014    Past Surgical History:  Procedure Laterality Date   ABDOMINAL HYSTERECTOMY  1976   CARDIAC CATHETERIZATION     LEFT HEART CATH AND CORONARY ANGIOGRAPHY N/A 05/01/2017   Procedure: LEFT HEART CATH AND CORONARY ANGIOGRAPHY;  Surgeon: Minna Merritts, MD;  Location: [facility];  Service: Cardiovascular;  Laterality: N/A;   OOPHORECTOMY      Family History  Problem Relation Age of Onset   Heart failure Father    Arrhythmia Father    Hypertension Father    Arrhythmia Sister    Hypertension Sister    Congestive Heart Failure Brother    Hypertension Brother    Cardiomyopathy Sister    Diabetes Brother    Breast cancer Paternal Aunt 60    Social History   Tobacco Use   Smoking status: Former    Packs/day: 0.50    Years: 18.00    Pack years: 9.00    Types: Cigarettes    Start date: 06/23/1956    Quit date: 11/22/1995    Years since quitting: 25.3   Smokeless tobacco: Former    Quit date: 11/22/1996   Tobacco comments:    smoking cessation materials not required  Substance Use Topics   Alcohol use: Yes    Alcohol/week: 0.0 standard drinks    Comment: socially     Current Outpatient Medications:    acetaminophen (TYLENOL) 500 MG tablet, Take 1 tablet (500 mg total) by mouth every 6 (six) hours as needed for headache., Disp: 30 tablet, Rfl: 0   aspirin 81 MG chewable tablet, Chew 1 tablet by mouth daily., Disp: , Rfl:    atorvastatin (LIPITOR) 40 MG tablet, TAKE 1 TABLET BY MOUTH  DAILY, Disp: 90 tablet, Rfl: 2   carvedilol (COREG) 25 MG tablet, Take 1 tablet (25 mg total) by mouth 2 (two) times daily with a meal., Disp: 180 tablet, Rfl: 3   Cholecalciferol (VITAMIN D) 50 MCG (2000 UT) CAPS, Take 1 capsule by mouth daily., Disp: , Rfl:    cloNIDine (CATAPRES) 0.1 MG tablet, Take 1 tablet (0.1 mg total) by  mouth every evening., Disp: 90 tablet, Rfl: 3   Cyanocobalamin (VITAMIN B-12) 2000 MCG TBCR, Take by mouth daily., Disp: , Rfl:    ferrous sulfate 325 (65 FE) MG tablet, Take 325 mg by mouth daily with breakfast., Disp: , Rfl:    fluticasone (FLONASE) 50 MCG/ACT nasal spray, USE 1 SPRAY IN EACH NOSTRIL TWO TIMES DAILY, Disp: 48 g, Rfl: 1   hydrALAZINE (APRESOLINE) 25 MG tablet, Take 1 tablet (25 mg total) by mouth 3 (three) times daily., Disp: 347 tablet, Rfl: 3   isosorbide mononitrate (IMDUR) 30 MG 24 hr tablet, Take 1 tablet (30 mg total) by  mouth daily. Please schedule office visit for further refills. Thank you!, Disp: 90 tablet, Rfl: 3   MAGNESIUM-ZINC PO, Take by mouth daily., Disp: , Rfl:    Multiple Vitamin (MULTIVITAMIN) capsule, Take 1 capsule by mouth daily., Disp: , Rfl:    olmesartan (BENICAR) 40 MG tablet, Take 1 tablet (40 mg total) by mouth daily., Disp: 90 tablet, Rfl: 3   oxybutynin (DITROPAN) 5 MG tablet, TAKE 1 TABLET BY MOUTH  TWICE DAILY, Disp: 180 tablet, Rfl: 3   pregabalin (LYRICA) 50 MG capsule, Take 1-2 capsules (50-100 mg total) by mouth at bedtime., Disp: 90 capsule, Rfl: 0   vitamin C (ASCORBIC ACID) 500 MG tablet, Take 500 mg by mouth daily., Disp: , Rfl:    rOPINIRole (REQUIP) 2 MG tablet, Take 1 tablet (2 mg total) by mouth at bedtime., Disp: 90 tablet, Rfl: 1   tiZANidine (ZANAFLEX) 2 MG tablet, TAKE 1 TABLET BY MOUTH  EVERY 8 HOURS AS NEEDED FOR MUSCLE SPASM(S), Disp: 90 tablet, Rfl: 0   valACYclovir (VALTREX) 500 MG tablet, TAKE 1 TABLET BY MOUTH 2  TIMES DAILY. PER EPISODE AS NEEDED, TAKE FOR 10 DAYS (Patient not taking: Reported on 04/05/2021), Disp: 90 tablet, Rfl: 0  Allergies  Allergen Reactions   Amlodipine Swelling   Meloxicam Palpitations    I personally reviewed active problem list, medication list, allergies, family history, social history, health maintenance with the patient/caregiver today.   ROS  Constitutional: Negative for fever or weight  change.  Respiratory: Negative for cough and shortness of breath.   Cardiovascular: Negative for chest pain or palpitations.  Gastrointestinal: Negative for abdominal pain, no bowel changes.  Musculoskeletal: Negative for gait problem or joint swelling.  Skin: Negative for rash.  Neurological: Negative for dizziness or headache.  No other specific complaints in a complete review of systems (except as listed in HPI above).   Objective  Vitals:   04/05/21 1434  BP: 116/70  Pulse: 78  Resp: 16  Temp: 98.1 F (36.7 C)  SpO2: 99%  Weight: 158 lb (71.7 kg)  Height: 5\' 4"  (1.626 m)    Body mass index is 27.12 kg/m.  Physical Exam  Constitutional: Patient appears well-developed and well-nourished. Overweight.  No distress.  HEENT: head atraumatic, normocephalic, pupils equal and reactive to light, neck supple Cardiovascular: Normal rate, regular rhythm and normal heart sounds.  No murmur heard. No BLE edema. Pulmonary/Chest: Effort normal and breath sounds normal. No respiratory distress. Abdominal: Soft.  There is no tenderness. Muscular skeletal: enlarged mCP right thumb  Skin: easy bruising  Psychiatric: Patient has a normal mood and affect. behavior is normal. Judgment and thought content normal.   Diabetic Foot Exam: Diabetic Foot Exam - Simple   Simple Foot Form Diabetic Foot exam was performed with the following findings: Yes 04/05/2021  3:11 PM  Visual Inspection No deformities, no ulcerations, no other skin breakdown bilaterally: Yes Sensation Testing Intact to touch and monofilament testing bilaterally: Yes Pulse Check Posterior Tibialis and Dorsalis pulse intact bilaterally: Yes Comments      PHQ2/9: Depression screen [facility] 2/9 04/05/2021 11/21/2020 08/01/2020 04/30/2020 03/30/2020  Decreased Interest 0 0 0 0 0  Down, Depressed, Hopeless 0 0 1 0 0  PHQ - 2 Score 0 0 1 0 0  Altered sleeping 0 - 0 - -  Tired, decreased energy 0 - 0 - -  Change in appetite 0 - 0 - -   Feeling bad or failure about yourself  0 - 0 - -  Trouble concentrating 0 - 0 - -  Moving slowly or fidgety/restless 0 - 0 - -  Suicidal thoughts 0 - 0 - -  PHQ-9 Score 0 - 1 - -  Difficult doing work/chores - - - - -  Some recent data might be hidden    phq 9 is negative   Fall Risk: Fall Risk  04/05/2021 11/21/2020 08/01/2020 04/30/2020 03/30/2020  Falls in the past year? 0 0 0 0 0  Number falls in past yr: 0 0 0 0 0  Injury with Fall? 0 0 0 0 0  Comment - - - - -  Risk for fall due to : No Fall Risks - - - -  Risk for fall due to: Comment - - - - -  Follow up Falls prevention discussed - - - -      Functional Status Survey: Is the patient deaf or have difficulty hearing?: No Does the patient have difficulty seeing, even when wearing glasses/contacts?: No Does the patient have difficulty concentrating, remembering, or making decisions?: No Does the patient have difficulty walking or climbing stairs?: No Does the patient have difficulty dressing or bathing?: No Does the patient have difficulty doing errands alone such as visiting a doctor's office or shopping?: No    Assessment & Plan  1. Dyslipidemia associated with type 2 diabetes mellitus (HCC)  - POCT HgB A1C - [facility] Diabetes Foot Exam - Lipid panel - Microalbumin / creatinine urine ratio  2. Need for immunization against influenza  - Flu Vaccine QUAD High Dose(Fluad)  3. Atrial tachycardia (HCC)  Rate controlled   4. Benign essential HTN  - COMPLETE METABOLIC PANEL WITH GFR - CBC with Differential/Platelet  5. Unstable angina (HCC)  Under control with medication management  6. Primary osteoarthritis of first carpometacarpal joint of right hand  Advised to go back to Ortho   7. Chronic bilateral low back pain without sciatica  - tiZANidine (ZANAFLEX) 2 MG tablet; TAKE 1 TABLET BY MOUTH  EVERY 8 HOURS AS NEEDED FOR MUSCLE SPASM(S)  Dispense: 90 tablet; Refill: 0  8. RLS (restless legs syndrome)  -  rOPINIRole (REQUIP) 2 MG tablet; Take 1 tablet (2 mg total) by mouth at bedtime.  Dispense: 90 tablet; Refill: 1  9. Chronic pain of right thumb   10. Urge incontinence   11. DDD (degenerative disc disease), cervical  - pregabalin (LYRICA) 50 MG capsule; Take 1-2 capsules (50-100 mg total) by mouth at bedtime.  Dispense: 90 capsule; Refill: 0  12. Cervical radiculitis  - pregabalin (LYRICA) 50 MG capsule; Take 1-2 capsules (50-100 mg total) by mouth at bedtime.  Dispense: 90 capsule; Refill: 0    13. Senile purpura (HCC)  On arms, bruising easily, reassurance given

## 2021-04-06 LAB — LIPID PANEL
Cholesterol: 145 mg/dL (ref <200)
HDL: 77 mg/dL (ref >=50)
LDL Cholesterol (Calc): 54 mg/dL (calc)
Non-HDL Cholesterol (Calc): 68 mg/dL (calc) (ref <130)
Total CHOL/HDL Ratio: 1.9 (calc) (ref <5.0)
Triglycerides: 55 mg/dL (ref <150)

## 2021-04-06 LAB — COMPLETE METABOLIC PANEL WITH GFR
AG Ratio: 1.7 (calc) (ref 1.0–2.5)
ALT: 21 U/L (ref 6–29)
AST: 20 U/L (ref 10–35)
Albumin: 4.3 g/dL (ref 3.6–5.1)
Alkaline phosphatase (APISO): 61 U/L (ref 37–153)
BUN/Creatinine Ratio: 29 (calc) — ABNORMAL HIGH (ref 6–22)
BUN: 27 mg/dL — ABNORMAL HIGH (ref 7–25)
CO2: 26 mmol/L (ref 20–32)
Calcium: 9.6 mg/dL (ref 8.6–10.4)
Chloride: 106 mmol/L (ref 98–110)
Creat: 0.92 mg/dL (ref 0.60–1.00)
Globulin: 2.5 g/dL (calc) (ref 1.9–3.7)
Glucose, Bld: 94 mg/dL (ref 65–99)
Potassium: 4.2 mmol/L (ref 3.5–5.3)
Sodium: 140 mmol/L (ref 135–146)
Total Bilirubin: 0.6 mg/dL (ref 0.2–1.2)
Total Protein: 6.8 g/dL (ref 6.1–8.1)
eGFR: 65 mL/min/{1.73_m2} (ref >=60)

## 2021-04-06 LAB — CBC WITH DIFFERENTIAL/PLATELET
Absolute Monocytes: 432 cells/uL (ref 200–950)
Basophils Absolute: 42 cells/uL (ref 0–200)
Basophils Relative: 0.7 %
Eosinophils Absolute: 222 cells/uL (ref 15–500)
Eosinophils Relative: 3.7 %
HCT: 42.2 % (ref 35.0–45.0)
Hemoglobin: 14 g/dL (ref 11.7–15.5)
Lymphs Abs: 1578 cells/uL (ref 850–3900)
MCH: 30.4 pg (ref 27.0–33.0)
MCHC: 33.2 g/dL (ref 32.0–36.0)
MCV: 91.5 fL (ref 80.0–100.0)
MPV: 11.7 fL (ref 7.5–12.5)
Monocytes Relative: 7.2 %
Neutro Abs: 3726 cells/uL (ref 1500–7800)
Neutrophils Relative %: 62.1 %
Platelets: 206 10*3/uL (ref 140–400)
RBC: 4.61 10*6/uL (ref 3.80–5.10)
RDW: 11.6 % (ref 11.0–15.0)
Total Lymphocyte: 26.3 %
WBC: 6 10*3/uL (ref 3.8–10.8)

## 2021-04-06 LAB — MICROALBUMIN / CREATININE URINE RATIO
Creatinine, Urine: 54 mg/dL (ref 20–275)
Microalb Creat Ratio: 6 mcg/mg creat (ref <30)
Microalb, Ur: 0.3 mg/dL

## 2021-04-23 ENCOUNTER — Ambulatory Visit (INDEPENDENT_AMBULATORY_CARE_PROVIDER_SITE_OTHER): Payer: Medicare Other

## 2021-04-23 DIAGNOSIS — Z0000 Encounter for general adult medical examination without abnormal findings: Secondary | ICD-10-CM | POA: Diagnosis not present

## 2021-04-23 NOTE — Progress Notes (Signed)
Subjective:   Alyssa Lawson is a 75 y.o. female who presents for Medicare Annual (Subsequent) preventive examination.  Virtual Visit via Telephone Note  I connected with  Alyssa Lawson on 04/23/21 at  2:50 PM EDT by telephone and verified that I am speaking with the correct person using two identifiers.  Location: Patient: home Provider: [place] Persons participating in the virtual visit: [place]   I discussed the limitations, risks, security and privacy concerns of performing an evaluation and management service by telephone and the availability of in person appointments. The patient expressed understanding and agreed to proceed.  Interactive audio and video telecommunications were attempted between this nurse and patient, however failed, due to patient having technical difficulties OR patient did not have access to video capability.  We continued and completed visit with audio only.  Some vital signs may be absent or patient reported.   Clemetine Marker, LPN   Review of Systems     Cardiac Risk Factors include: advanced age (>61men, >39 women);dyslipidemia;diabetes mellitus;hypertension     Objective:    Today's Vitals   04/23/21 1459  PainSc: 10-Worst pain ever   There is no height or weight on file to calculate BMI.  Advanced Directives 04/23/2021 12/11/2020 03/22/2020 03/18/2019 06/05/2018 03/16/2018 05/01/2017  Does Patient Have a Medical Advance Directive? No No No No Yes Yes Yes  Type of Advance Directive - - - - Press photographer;Living will [place];Living will [place];Living will  Does patient want to make changes to medical advance directive? - - - - - - No - Patient declined  Copy of [place] in Chart? - - - - - No - copy requested No - copy requested  Would patient like information on creating a medical advance directive? No - Patient declined - Yes (MAU/Ambulatory/Procedural  Areas - Information given) Yes (MAU/Ambulatory/Procedural Areas - Information given) - - -    Current Medications (verified) Outpatient Encounter Medications as of 04/23/2021  Medication Sig   acetaminophen (TYLENOL) 500 MG tablet Take 1 tablet (500 mg total) by mouth every 6 (six) hours as needed for headache.   aspirin 81 MG chewable tablet Chew 1 tablet by mouth daily.   atorvastatin (LIPITOR) 40 MG tablet TAKE 1 TABLET BY MOUTH  DAILY   carvedilol (COREG) 25 MG tablet Take 1 tablet (25 mg total) by mouth 2 (two) times daily with a meal.   cetirizine (ZYRTEC) 10 MG tablet Take 10 mg by mouth daily.   Cholecalciferol (VITAMIN D) 50 MCG (2000 UT) CAPS Take 1 capsule by mouth daily.   cloNIDine (CATAPRES) 0.1 MG tablet Take 1 tablet (0.1 mg total) by mouth every evening.   Cyanocobalamin (VITAMIN B-12) 2000 MCG TBCR Take by mouth daily.   ferrous sulfate 325 (65 FE) MG tablet Take 325 mg by mouth daily with breakfast.   fluticasone (FLONASE) 50 MCG/ACT nasal spray USE 1 SPRAY IN EACH NOSTRIL TWO TIMES DAILY   hydrALAZINE (APRESOLINE) 25 MG tablet Take 1 tablet (25 mg total) by mouth 3 (three) times daily.   isosorbide mononitrate (IMDUR) 30 MG 24 hr tablet Take 1 tablet (30 mg total) by mouth daily. Please schedule office visit for further refills. Thank you!   MAGNESIUM-ZINC PO Take by mouth daily.   Multiple Vitamin (MULTIVITAMIN) capsule Take 1 capsule by mouth daily.   olmesartan (BENICAR) 40 MG tablet Take 1 tablet (40 mg total) by mouth daily.   oxybutynin (  DITROPAN) 5 MG tablet TAKE 1 TABLET BY MOUTH  TWICE DAILY   pregabalin (LYRICA) 50 MG capsule Take 1-2 capsules (50-100 mg total) by mouth at bedtime.   rOPINIRole (REQUIP) 2 MG tablet Take 1 tablet (2 mg total) by mouth at bedtime.   tiZANidine (ZANAFLEX) 2 MG tablet TAKE 1 TABLET BY MOUTH  EVERY 8 HOURS AS NEEDED FOR MUSCLE SPASM(S)   vitamin C (ASCORBIC ACID) 500 MG tablet Take 500 mg by mouth daily.   valACYclovir (VALTREX) 500  MG tablet TAKE 1 TABLET BY MOUTH 2  TIMES DAILY. PER EPISODE AS NEEDED, TAKE FOR 10 DAYS (Patient not taking: Reported on 04/23/2021)   No facility-administered encounter medications on file as of 04/23/2021.    Allergies (verified) Amlodipine and Meloxicam   History: Past Medical History:  Diagnosis Date   Allergy ?   sinus bleeding and nose bleeding when i get out of bed in the AM   Anemia    Arthritis    Bladder disorder    a. urinary incontinence   Cancer (HCC)    cervical ca   CHF (congestive heart failure) (HCC)    Coronary artery disease, non-occlusive    a. LHC 05/01/2017: LM no sig dz, LAD no sig dz, LCx no sig dz, RCA no sig dz, EF 55-65%, mod MR   Endometriosis    GERD (gastroesophageal reflux disease)    Hyperlipidemia    Hypertension    Mitral regurgitation    a. TTE 05/01/2017: EF 55-60%, no RWMA, not technically sufficient to allow for LV diastolic function, mild to moderate mitral regurgitation, mildly dilated left atrium, RV systolic function normal, PASP 35 mmHg, small to moderate pericardial effusion was noted   Restless leg syndrome    Past Surgical History:  Procedure Laterality Date   ABDOMINAL HYSTERECTOMY  1976   CARDIAC CATHETERIZATION     LEFT HEART CATH AND CORONARY ANGIOGRAPHY N/A 05/01/2017   Procedure: LEFT HEART CATH AND CORONARY ANGIOGRAPHY;  Surgeon: Minna Merritts, MD;  Location: [place] [facility];  Service: Cardiovascular;  Laterality: N/A;   OOPHORECTOMY     Family History  Problem Relation Age of Onset   Heart failure Father    Arrhythmia Father    Hypertension Father    Arrhythmia Sister    Hypertension Sister    Congestive Heart Failure Brother    Hypertension Brother    Cardiomyopathy Sister    Diabetes Brother    Breast cancer Paternal Aunt 16   Social History   Socioeconomic History   Marital status: Married    Spouse name: Doren Custard   Number of children: 1   Years of education: some college   Highest education level:  12th grade  Occupational History   Occupation: Retired  Tobacco Use   Smoking status: Former    Packs/day: 0.50    Years: 18.00    Pack years: 9.00    Types: Cigarettes    Start date: 06/23/1956    Quit date: 11/22/1995    Years since quitting: 25.4   Smokeless tobacco: Former    Quit date: 11/22/1996   Tobacco comments:    None  Vaping Use   Vaping Use: Never used  Substance and Sexual Activity   Alcohol use: Yes    Alcohol/week: 1.0 standard drink    Types: 1 Glasses of wine per week    Comment: glass of wine occasionally maybe once a month   Drug use: No   Sexual activity: Yes  Partners: Male  Other Topics Concern   Not on file  Social History Narrative   She stopped working Spring 2018, 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 [employer] - she retired because of stress   Social Determinants of Radio broadcast assistant Strain: Low Risk    Difficulty of Paying Living Expenses: Not hard at all  Food Insecurity: No Food Insecurity   Worried About Charity fundraiser in the Last Year: Never true   Arboriculturist in the Last Year: Never true  Transportation Needs: No Transportation Needs   Lack of Transportation (Medical): No   Lack of Transportation (Non-Medical): No  Physical Activity: Sufficiently Active   Days of Exercise per Week: 5 days   Minutes of Exercise per Session: 120 min  Stress: No Stress Concern Present   Feeling of Stress : Not at all  Social Connections: Socially Integrated   Frequency of Communication with Friends and Family: More than three times a week   Frequency of Social Gatherings with Friends and Family: More than three times a week   Attends Religious Services: More than 4 times per year   Active Member of [employer] or Organizations: Yes   Attends Music therapist: More than 4 times per year   Marital Status: Married    Tobacco Counseling Counseling given: Not Answered Tobacco comments:  None   Clinical Intake:  Pre-visit preparation completed: Yes  Pain : 0-10 Pain Score: 10-Worst pain ever Pain Type: Chronic pain Pain Location: Arm Pain Orientation: Right Pain Descriptors / Indicators: Aching Pain Onset: More than a month ago Pain Frequency: Constant     Nutritional Risks: None Diabetes: Yes CBG done?: No Did pt. bring in CBG monitor from home?: No  How often do you need to have someone help you when you read instructions, pamphlets, or other written materials from your doctor or pharmacy?: 1 - Never  Nutrition Risk Assessment:  Has the patient had any N/V/D within the last 2 months?  No  Does the patient have any non-healing wounds?  No  Has the patient had any unintentional weight loss or weight gain?  No   Diabetes:  Is the patient diabetic?  Yes  If diabetic, was a CBG obtained today?  No  Did the patient bring in their glucometer from home?  No  How often do you monitor your CBG's? Pt does not actively check blood sugar  Financial Strains and Diabetes Management:  Are you having any financial strains with the device, your supplies or your medication? No .  Does the patient want to be seen by Chronic Care Management for management of their diabetes?  No  Would the patient like to be referred to a Nutritionist or for Diabetic Management?  No   Diabetic Exams:  Diabetic Eye Exam: Completed per patient at [facility]; need records.   Diabetic Foot Exam: Completed 04/05/21.   Interpreter Needed?: No  Information entered by :: Clemetine Marker LPN   Activities of Daily Living In your present state of health, do you have any difficulty performing the following activities: 04/23/2021 04/05/2021  Hearing? N N  Vision? N N  Difficulty concentrating or making decisions? N N  Walking or climbing stairs? N N  Dressing or bathing? N N  Doing errands, shopping? N N  Preparing Food and eating ? N -  Using the Toilet? N -  In the past six  months, have you  accidently leaked urine? Y -  Do you have problems with loss of bowel control? N -  Managing your Medications? N -  Managing your Finances? N -  Housekeeping or managing your Housekeeping? N -  Some recent data might be hidden    Patient Care Team: Steele Sizer, MD as PCP - General (Family Medicine) Minna Merritts, MD as Consulting Physician (Cardiology)  Indicate any recent Medical Services you may have received from other than [facility] providers in the past year (date may be approximate).     Assessment:   This is a routine wellness examination for [facility].  Hearing/Vision screen Hearing Screening - Comments:: Pt denies hearing difficulty Vision Screening - Comments:: Annual vision screenings done at [facility] issues and exercise activities discussed: Current Exercise Habits: Home exercise routine, Type of exercise: Other - see comments (yard work), Time (Minutes): > 60, Frequency (Times/Week): 5, Weekly Exercise (Minutes/Week): 0, Intensity: Moderate, Exercise limited by: None identified   Goals Addressed   None    Depression Screen PHQ 2/9 Scores 04/23/2021 04/05/2021 11/21/2020 08/01/2020 04/30/2020 03/30/2020 03/22/2020  PHQ - 2 Score 0 0 0 1 0 0 0  PHQ- 9 Score - 0 - 1 - - -    Fall Risk Fall Risk  04/23/2021 04/05/2021 11/21/2020 08/01/2020 04/30/2020  Falls in the past year? 0 0 0 0 0  Number falls in past yr: 0 0 0 0 0  Injury with Fall? 0 0 0 0 0  Comment - - - - -  Risk for fall due to : No Fall Risks No Fall Risks - - -  Risk for fall due to: Comment - - - - -  Follow up Falls prevention discussed Falls prevention discussed - - -    FALL RISK PREVENTION PERTAINING TO THE HOME:  Any stairs in or around the home? Yes  If so, are there any without handrails? No  Home free of loose throw rugs in walkways, pet beds, electrical cords, etc? Yes  Adequate lighting in your home to reduce risk of falls? Yes   ASSISTIVE DEVICES UTILIZED TO PREVENT  FALLS:  Life alert? No  Use of a cane, walker or w/c? No  Grab bars in the bathroom? Yes  Shower chair or bench in shower? No  Elevated toilet seat or a handicapped toilet? Yes   TIMED UP AND GO:  Was the test performed? No . Telephonic visit.   Cognitive Function: Normal cognitive status assessed by direct observation by this Nurse Health Advisor. No abnormalities found.       6CIT Screen 03/22/2020 03/18/2019 03/16/2018  What Year? 0 points 0 points 0 points  What month? 0 points 0 points 0 points  What time? 0 points 0 points 0 points  Count back from 20 0 points 0 points 0 points  Months in reverse 0 points 0 points 0 points  Repeat phrase 0 points 0 points 4 points  Total Score 0 0 4    Immunizations Immunization History  Administered Date(s) Administered   Fluad Quad(high Dose 65+) 05/10/2019, 03/30/2020, 04/05/2021   Influenza Split 04/27/2007, 04/19/2009   Influenza, High Dose Seasonal PF 06/22/2015, 06/06/2016, 02/25/2017, 04/21/2018   Influenza,inj,Quad PF,6+ Mos 03/15/2013, 06/19/2014   Moderna Sars-Covid-2 Vaccination 06/23/2019, 07/22/2019, 02/11/2020, 09/30/2020, 03/02/2021   Pneumococcal Conjugate-13 12/21/2014   Pneumococcal Polysaccharide-23 04/27/2007, 09/14/2012   Td 11/02/2007   Tdap 01/20/2018   Zoster, Live 07/12/2013    TDAP status: Up to date  Flu Vaccine  status: Up to date  Pneumococcal vaccine status: Up to date  Covid-19 vaccine status: Completed vaccines  Qualifies for Shingles Vaccine? Yes   Zostavax completed Yes   Shingrix Completed?: No.    Education has been provided regarding the importance of this vaccine. Patient has been advised to call insurance company to determine out of pocket expense if they have not yet received this vaccine. Advised may also receive vaccine at local pharmacy or Health Dept. Verbalized acceptance and understanding.  Screening Tests Health Maintenance  Topic Date Due   Zoster Vaccines- Shingrix (1 of 2)  Never done   OPHTHALMOLOGY EXAM  02/17/2020   COVID-19 Vaccine (6 - Booster for Moderna series) 04/27/2021   MAMMOGRAM  09/05/2021   HEMOGLOBIN A1C  10/04/2021   FOOT EXAM  04/05/2022   COLONOSCOPY (Pts 45-61yrs Insurance coverage will need to be confirmed)  02/11/2023   TETANUS/TDAP  01/21/2028   Pneumonia Vaccine 69+ Years old  Completed   INFLUENZA VACCINE  Completed   DEXA SCAN  Completed   Hepatitis C Screening  Completed   HPV VACCINES  Aged Out    Health Maintenance  Health Maintenance Due  Topic Date Due   Zoster Vaccines- Shingrix (1 of 2) Never done   OPHTHALMOLOGY EXAM  02/17/2020   COVID-19 Vaccine (6 - Booster for Moderna series) 04/27/2021    Colorectal cancer screening: Type of screening: Colonoscopy. Completed 02/10/13. Repeat every 10 years  Mammogram status: Completed 09/05/20. Repeat every year  Bone Density status: Completed 08/04/18. Results reflect: Bone density results: NORMAL. Repeat every 2 years.  Lung Cancer Screening: (Low Dose CT Chest recommended if Age 31-80 years, 30 pack-year currently smoking OR have quit w/in 15years.) does not qualify.  Additional Screening:  Hepatitis C Screening: does qualify; Completed 09/14/12  Vision Screening: Recommended annual ophthalmology exams for early detection of glaucoma and other disorders of the eye. Is the patient up to date with their annual eye exam?  Yes  Who is the provider or what is the name of the office in which the patient attends annual eye exams? [facility].   Dental Screening: Recommended annual dental exams for proper oral hygiene  Community Resource Referral / Chronic Care Management: CRR required this visit?  No   CCM required this visit?  No      Plan:     I have personally reviewed and noted the following in the patient's chart:   Medical and social history Use of alcohol, tobacco or illicit drugs  Current medications and supplements including opioid prescriptions.  Functional  ability and status Nutritional status Physical activity Advanced directives List of other physicians Hospitalizations, surgeries, and ER visits in previous 12 months Vitals Screenings to include cognitive, depression, and falls Referrals and appointments  In addition, I have reviewed and discussed with patient certain preventive protocols, quality metrics, and best practice recommendations. A written personalized care plan for preventive services as well as general preventive health recommendations were provided to patient.     Clemetine Marker, LPN   60/06/930   Nurse Notes: pt c/o sinus issues with nose bleeds; advised to discuss with Dr. Ancil Boozer and use humidifier.

## 2021-04-23 NOTE — Patient Instructions (Signed)
Alyssa Lawson , Thank you for taking time to come for your Medicare Wellness Visit. I appreciate your ongoing commitment to your health goals. Please review the following plan we discussed and let me know if I can assist you in the future.   Screening recommendations/referrals: Colonoscopy: done 02/10/13. Repat 01/2023 Mammogram: done 09/05/20 Bone Density: done 08/04/18 Recommended yearly ophthalmology/optometry visit for glaucoma screening and checkup Recommended yearly dental visit for hygiene and checkup  Vaccinations: Influenza vaccine: done 04/05/21  Pneumococcal vaccine: done 12/21/14 Tdap vaccine: done 01/20/18 Shingles vaccine: Shingrix discussed. Please contact your pharmacy for coverage information.  Covid-19: done 06/23/19, 07/22/19, 02/11/20, 09/30/20 & 03/02/21  Advanced directives: Please bring a copy of your health care power of attorney and living will to the office at your convenience once you have completed those documents.   Conditions/risks identified: Recommend continuing fall prevention in the home  Next appointment: Follow up in one year for your annual wellness visit    Preventive Care 65 Years and Older, Female Preventive care refers to lifestyle choices and visits with your health care provider that can promote health and wellness. What does preventive care include? A yearly physical exam. This is also called an annual well check. Dental exams once or twice a year. Routine eye exams. Ask your health care provider how often you should have your eyes checked. Personal lifestyle choices, including: Daily care of your teeth and gums. Regular physical activity. Eating a healthy diet. Avoiding tobacco and drug use. Limiting alcohol use. Practicing safe sex. Taking low-dose aspirin every day. Taking vitamin and mineral supplements as recommended by your health care provider. What happens during an annual well check? The services and screenings done by your health care  provider during your annual well check will depend on your age, overall health, lifestyle risk factors, and family history of disease. Counseling  Your health care provider may ask you questions about your: Alcohol use. Tobacco use. Drug use. Emotional well-being. Home and relationship well-being. Sexual activity. Eating habits. History of falls. Memory and ability to understand (cognition). Work and work Statistician. Reproductive health. Screening  You may have the following tests or measurements: Height, weight, and BMI. Blood pressure. Lipid and cholesterol levels. These may be checked every 5 years, or more frequently if you are over 55 years old. Skin check. Lung cancer screening. You may have this screening every year starting at age 38 if you have a 30-pack-year history of smoking and currently smoke or have quit within the past 15 years. Fecal occult blood test (FOBT) of the stool. You may have this test every year starting at age 58. Flexible sigmoidoscopy or colonoscopy. You may have a sigmoidoscopy every 5 years or a colonoscopy every 10 years starting at age 66. Hepatitis C blood test. Hepatitis B blood test. Sexually transmitted disease (STD) testing. Diabetes screening. This is done by checking your blood sugar (glucose) after you have not eaten for a while (fasting). You may have this done every 1-3 years. Bone density scan. This is done to screen for osteoporosis. You may have this done starting at age 7. Mammogram. This may be done every 1-2 years. Talk to your health care provider about how often you should have regular mammograms. Talk with your health care provider about your test results, treatment options, and if necessary, the need for more tests. Vaccines  Your health care provider may recommend certain vaccines, such as: Influenza vaccine. This is recommended every year. Tetanus, diphtheria, and acellular pertussis (Tdap,  Td) vaccine. You may need a Td  booster every 10 years. Zoster vaccine. You may need this after age 15. Pneumococcal 13-valent conjugate (PCV13) vaccine. One dose is recommended after age 64. Pneumococcal polysaccharide (PPSV23) vaccine. One dose is recommended after age 73. Talk to your health care provider about which screenings and vaccines you need and how often you need them. This information is not intended to replace advice given to you by your health care provider. Make sure you discuss any questions you have with your health care provider. Document Released: 07/06/2015 Document Revised: 02/27/2016 Document Reviewed: 04/10/2015 Elsevier Interactive Patient Education  2017 [place] Prevention in the Home Falls can cause injuries. They can happen to people of all ages. There are many things you can do to make your home safe and to help prevent falls. What can I do on the outside of my home? Regularly fix the edges of walkways and driveways and fix any cracks. Remove anything that might make you trip as you walk through a door, such as a raised step or threshold. Trim any bushes or trees on the path to your home. Use bright outdoor lighting. Clear any walking paths of anything that might make someone trip, such as rocks or tools. Regularly check to see if handrails are loose or broken. Make sure that both sides of any steps have handrails. Any raised decks and porches should have guardrails on the edges. Have any leaves, snow, or ice cleared regularly. Use sand or salt on walking paths during winter. Clean up any spills in your garage right away. This includes oil or grease spills. What can I do in the bathroom? Use night lights. Install grab bars by the toilet and in the tub and shower. Do not use towel bars as grab bars. Use non-skid mats or decals in the tub or shower. If you need to sit down in the shower, use a plastic, non-slip stool. Keep the floor dry. Clean up any water that spills on the floor  as soon as it happens. Remove soap buildup in the tub or shower regularly. Attach bath mats securely with double-sided non-slip rug tape. Do not have throw rugs and other things on the floor that can make you trip. What can I do in the bedroom? Use night lights. Make sure that you have a light by your bed that is easy to reach. Do not use any sheets or blankets that are too big for your bed. They should not hang down onto the floor. Have a firm chair that has side arms. You can use this for support while you get dressed. Do not have throw rugs and other things on the floor that can make you trip. What can I do in the kitchen? Clean up any spills right away. Avoid walking on wet floors. Keep items that you use a lot in easy-to-reach places. If you need to reach something above you, use a strong step stool that has a grab bar. Keep electrical cords out of the way. Do not use floor polish or wax that makes floors slippery. If you must use wax, use non-skid floor wax. Do not have throw rugs and other things on the floor that can make you trip. What can I do with my stairs? Do not leave any items on the stairs. Make sure that there are handrails on both sides of the stairs and use them. Fix handrails that are broken or loose. Make sure that handrails are as  long as the stairways. Check any carpeting to make sure that it is firmly attached to the stairs. Fix any carpet that is loose or worn. Avoid having throw rugs at the top or bottom of the stairs. If you do have throw rugs, attach them to the floor with carpet tape. Make sure that you have a light switch at the top of the stairs and the bottom of the stairs. If you do not have them, ask someone to add them for you. What else can I do to help prevent falls? Wear shoes that: Do not have high heels. Have rubber bottoms. Are comfortable and fit you well. Are closed at the toe. Do not wear sandals. If you use a stepladder: Make sure that it is  fully opened. Do not climb a closed stepladder. Make sure that both sides of the stepladder are locked into place. Ask someone to hold it for you, if possible. Clearly mark and make sure that you can see: Any grab bars or handrails. First and last steps. Where the edge of each step is. Use tools that help you move around (mobility aids) if they are needed. These include: Canes. Walkers. Scooters. Crutches. Turn on the lights when you go into a dark area. Replace any light bulbs as soon as they burn out. Set up your furniture so you have a clear path. Avoid moving your furniture around. If any of your floors are uneven, fix them. If there are any pets around you, be aware of where they are. Review your medicines with your doctor. Some medicines can make you feel dizzy. This can increase your chance of falling. Ask your doctor what other things that you can do to help prevent falls. This information is not intended to replace advice given to you by your health care provider. Make sure you discuss any questions you have with your health care provider. Document Released: 04/05/2009 Document Revised: 11/15/2015 Document Reviewed: 07/14/2014 Elsevier Interactive Patient Education  2017 [employer].

## 2021-04-24 DIAGNOSIS — M1811 Unilateral primary osteoarthritis of first carpometacarpal joint, right hand: Secondary | ICD-10-CM | POA: Diagnosis not present

## 2021-04-24 DIAGNOSIS — Z794 Long term (current) use of insulin: Secondary | ICD-10-CM | POA: Diagnosis not present

## 2021-04-24 DIAGNOSIS — M79644 Pain in right finger(s): Secondary | ICD-10-CM | POA: Diagnosis not present

## 2021-04-24 DIAGNOSIS — E118 Type 2 diabetes mellitus with unspecified complications: Secondary | ICD-10-CM | POA: Diagnosis not present

## 2021-07-12 ENCOUNTER — Other Ambulatory Visit: Payer: Self-pay | Admitting: Cardiovascular Disease

## 2021-08-06 NOTE — Progress Notes (Addendum)
Name: Alyssa Lawson   MRN: [id number]    DOB: 06/03/1946   Date:08/07/2021       Progress Note  Subjective  Chief Complaint  Follow Up  HPI  HTN: taking coreg 25mg  daily, Benicar 40 , hydralazine 25 mg TID ( down from 50 QID )  clonidine qhs. BP is controlled. .  She denies chest pain or recent episodes of palpitation  Hyperlipidemia: last lipid panel was at goal, LDL was 52 . She is taking atorvastatin and denies muscle aches. Reviewed results with patient    DMII: diagnosed with diabetes years ago, since 2015  She stopped taking metformin due to pt c/o swelling and weight gain with medication. Pt was tarted Trulicity back in 76/1607 but stopped because of cost. She is on diet only at this time, A1C had gone up again to 6.3 % but she has changed her diet, and last visit it was down to 6.1 % She has associated HTN and dyslipidemia. Taking medications for that    Urinary incontinence: she has been on Ditropan , tolerating medication well, nocturia at most once at nigh. She stopped doing Kegel exercises, she states symptoms are unchanged   Senile purpura: stable and reassurance given    RLS: better with medication, currently on Requip but insurance told her no longer available, we will chang to Mirapex.    Chronic Back Pain: she has daily low back pain, She takes Tylenol prn, and Tizanidine, she needs refills.    Unstable angina: indigestion symptoms with abnormal EKG, normal cardiac cath done Nov 2018 by Dr. [name], on higher dose of Coreg, and Benicar also on Imdur and indigestion resolved, therefore symptoms controlled with medication management   Also on statin therapy and aspirin  She is doing well, no recent episodes of chest pain    Atrial tachycardia: no recent episodes of palpitation ,  she is on beta blocker and is rate controlled  , no problems lately    Right thumb pain:she had a steroid injection recently and pain has improved   Stress: she states her husband has dementia, is  paranoid, blames her for everything , he is now voiding all over the house, explained importance of placing him on a long term facility, she is afraid of cost. She will discuss it with a lawyer   Cervical radiculitis: she went to PT at [facility] , she states Tizanidine helps with neck spasms. She had C-spine x-ray that showed DDD. Marland Kitchen No tingling down her arm, but has intermittent tingling on carpal tunnel distribution every night. She states gabapentin did not work, we gave her lyrica but she it no longer taking it and symptoms have improved    Patient Active Problem List   Diagnosis Date Noted   Senile purpura ([place]) 04/05/2021   Cervical radiculitis 04/05/2021   DDD (degenerative disc disease), cervical 04/05/2021   Chronic pain of right thumb 04/05/2021   Type 2 diabetes mellitus with complication, with long-term current use of insulin ([place]) 11/09/2018   Mixed hyperlipidemia 11/09/2018   Smoker 11/09/2018   Atrial tachycardia ([place]) 08/25/2017   Unstable angina ([place]) 05/01/2017   Positive cardiac stress test 05/01/2017   Abnormal EKG 04/24/2017   Smoking history 04/24/2017   Rotator cuff tendinitis, left 04/10/2017   Cataracts, bilateral 11/25/2016   Impingement syndrome of shoulder, left 10/13/2016   Chronic bilateral low back pain without sciatica 37/03/6268   Metabolic syndrome 48/54/6270   Mitral regurgitation 06/22/2015   Melasma 12/21/2014  History of hysterectomy 12/21/2014   Restless leg syndrome 12/21/2014   Benign essential HTN 12/17/2014   Cataract 12/17/2014   Dyslipidemia 12/17/2014   Gastric reflux 12/17/2014   History of cervical cancer 12/17/2014   H/O iron deficiency anemia 12/17/2014   Urinary incontinence in female 16/03/9603   Dysmetabolic syndrome 54/02/8118   TI (tricuspid incompetence) 12/17/2014   Osteopenia of the elderly 12/17/2014    Past Surgical History:  Procedure Laterality Date   ABDOMINAL HYSTERECTOMY  1976   CARDIAC CATHETERIZATION      LEFT HEART CATH AND CORONARY ANGIOGRAPHY N/A 05/01/2017   Procedure: LEFT HEART CATH AND CORONARY ANGIOGRAPHY;  Surgeon: Minna Merritts, MD;  Location: [facility];  Service: Cardiovascular;  Laterality: N/A;   OOPHORECTOMY      Family History  Problem Relation Age of Onset   Heart failure Father    Arrhythmia Father    Hypertension Father    Arrhythmia Sister    Hypertension Sister    Congestive Heart Failure Brother    Hypertension Brother    Cardiomyopathy Sister    Diabetes Brother    Breast cancer Paternal Aunt 60    Social History   Tobacco Use   Smoking status: Former    Packs/day: 0.50    Years: 18.00    Pack years: 9.00    Types: Cigarettes    Start date: 06/23/1956    Quit date: 11/22/1995    Years since quitting: 25.7   Smokeless tobacco: Former    Quit date: 11/22/1996   Tobacco comments:    None  Substance Use Topics   Alcohol use: Yes    Alcohol/week: 1.0 standard drink    Types: 1 Glasses of wine per week    Comment: glass of wine occasionally maybe once a month     Current Outpatient Medications:    acetaminophen (TYLENOL) 500 MG tablet, Take 1 tablet (500 mg total) by mouth every 6 (six) hours as needed for headache., Disp: 30 tablet, Rfl: 0   aspirin 81 MG chewable tablet, Chew 1 tablet by mouth daily., Disp: , Rfl:    atorvastatin (LIPITOR) 40 MG tablet, TAKE 1 TABLET BY MOUTH  DAILY, Disp: 90 tablet, Rfl: 2   busPIRone (BUSPAR) 5 MG tablet, Take 1 tablet (5 mg total) by mouth 2 (two) times daily as needed., Disp: 60 tablet, Rfl: 0   carvedilol (COREG) 25 MG tablet, Take 1 tablet (25 mg total) by mouth 2 (two) times daily with a meal., Disp: 180 tablet, Rfl: 3   cetirizine (ZYRTEC) 10 MG tablet, Take 10 mg by mouth daily., Disp: , Rfl:    Cholecalciferol (VITAMIN D) 50 MCG (2000 UT) CAPS, Take 1 capsule by mouth daily., Disp: , Rfl:    cloNIDine (CATAPRES) 0.1 MG tablet, Take 1 tablet (0.1 mg total) by mouth every evening., Disp: 90 tablet,  Rfl: 3   Cyanocobalamin (VITAMIN B-12) 2000 MCG TBCR, Take by mouth daily., Disp: , Rfl:    ferrous sulfate 325 (65 FE) MG tablet, Take 325 mg by mouth daily with breakfast., Disp: , Rfl:    fluticasone (FLONASE) 50 MCG/ACT nasal spray, USE 1 SPRAY IN EACH NOSTRIL TWO TIMES DAILY, Disp: 48 g, Rfl: 1   hydrALAZINE (APRESOLINE) 25 MG tablet, Take 1 tablet (25 mg total) by mouth 3 (three) times daily., Disp: 347 tablet, Rfl: 3   isosorbide mononitrate (IMDUR) 30 MG 24 hr tablet, Take 1 tablet (30 mg total) by mouth daily. Please schedule office  visit for further refills. Thank you!, Disp: 90 tablet, Rfl: 3   MAGNESIUM-ZINC PO, Take by mouth daily., Disp: , Rfl:    Multiple Vitamin (MULTIVITAMIN) capsule, Take 1 capsule by mouth daily., Disp: , Rfl:    olmesartan (BENICAR) 40 MG tablet, Take 1 tablet (40 mg total) by mouth daily., Disp: 90 tablet, Rfl: 3   oxybutynin (DITROPAN) 5 MG tablet, TAKE 1 TABLET BY MOUTH  TWICE DAILY, Disp: 180 tablet, Rfl: 3   pramipexole (MIRAPEX) 0.5 MG tablet, Take 1 tablet (0.5 mg total) by mouth at bedtime as needed. In place of Requip, Disp: 90 tablet, Rfl: 1   valACYclovir (VALTREX) 500 MG tablet, TAKE 1 TABLET BY MOUTH 2  TIMES DAILY. PER EPISODE AS NEEDED, TAKE FOR 10 DAYS, Disp: 90 tablet, Rfl: 0   vitamin C (ASCORBIC ACID) 500 MG tablet, Take 500 mg by mouth daily., Disp: , Rfl:    Zoster Vaccine Adjuvanted (SHINGRIX) injection, Inject 0.5 mLs into the muscle once for 1 dose., Disp: 0.5 mL, Rfl: 1   tiZANidine (ZANAFLEX) 2 MG tablet, TAKE 1 TABLET BY MOUTH  EVERY 8 HOURS AS NEEDED FOR MUSCLE SPASM(S), Disp: 90 tablet, Rfl: 1  Allergies  Allergen Reactions   Amlodipine Swelling   Meloxicam Palpitations    I personally reviewed active problem list, medication list, allergies, family history, social history, health maintenance with the patient/caregiver today.   ROS  Constitutional: Negative for fever or weight change.  Respiratory: Negative for cough and  shortness of breath.   Cardiovascular: Negative for chest pain or palpitations.  Gastrointestinal: Negative for abdominal pain, no bowel changes.  Musculoskeletal: Negative for gait problem or joint swelling.  Skin: Negative for rash.  Neurological: Negative for dizziness or headache.  No other specific complaints in a complete review of systems (except as listed in HPI above).   Objective  Vitals:   08/07/21 1338  BP: 122/64  Pulse: 79  Resp: 16  SpO2: 97%  Weight: 158 lb (71.7 kg)  Height: 5\' 4"  (1.626 m)    Body mass index is 27.12 kg/m.  Physical Exam  Constitutional: Patient appears well-developed and well-nourished. Overweight.  No distress.  HEENT: head atraumatic, normocephalic, pupils equal and reactive to light,  neck supple Cardiovascular: Normal rate, regular rhythm and normal heart sounds.  3/6 systolic murmur heard. No BLE edema. Pulmonary/Chest: Effort normal and breath sounds normal. No respiratory distress. Abdominal: Soft.  There is no tenderness. Psychiatric: Patient has a normal mood and affect. behavior is normal. Judgment and thought content normal.   PHQ2/9: Depression screen [facility] 2/9 08/07/2021 04/23/2021 04/05/2021 11/21/2020 08/01/2020  Decreased Interest 0 0 0 0 0  Down, Depressed, Hopeless 0 0 0 0 1  PHQ - 2 Score 0 0 0 0 1  Altered sleeping 0 - 0 - 0  Tired, decreased energy 0 - 0 - 0  Change in appetite 0 - 0 - 0  Feeling bad or failure about yourself  0 - 0 - 0  Trouble concentrating 0 - 0 - 0  Moving slowly or fidgety/restless 0 - 0 - 0  Suicidal thoughts 0 - 0 - 0  PHQ-9 Score 0 - 0 - 1  Difficult doing work/chores - - - - -  Some recent data might be hidden    phq 9 is negative   Fall Risk: Fall Risk  08/07/2021 04/23/2021 04/05/2021 11/21/2020 08/01/2020  Falls in the past year? 0 0 0 0 0  Number falls in past yr:  0 0 0 0 0  Injury with Fall? 0 0 0 0 0  Comment - - - - -  Risk for fall due to : No Fall Risks No Fall Risks No Fall Risks -  -  Risk for fall due to: Comment - - - - -  Follow up Falls prevention discussed Falls prevention discussed Falls prevention discussed - -      Functional Status Survey: Is the patient deaf or have difficulty hearing?: No Does the patient have difficulty seeing, even when wearing glasses/contacts?: No Does the patient have difficulty concentrating, remembering, or making decisions?: No Does the patient have difficulty walking or climbing stairs?: No Does the patient have difficulty dressing or bathing?: No Does the patient have difficulty doing errands alone such as visiting a doctor's office or shopping?: No    Assessment & Plan  1. Dyslipidemia associated with type 2 diabetes mellitus (HCC)  - HgB A1c  2. Atrial tachycardia (HCC)  Continue beta blocker   3. Senile purpura ([place])  Reassurance given   4. RLS (restless legs syndrome)  - pramipexole (MIRAPEX) 0.5 MG tablet; Take 1 tablet (0.5 mg total) by mouth at bedtime as needed. In place of Requip  Dispense: 90 tablet; Refill: 1  5. Unstable angina ([place])   6. Chronic bilateral low back pain without sciatica  - tiZANidine (ZANAFLEX) 2 MG tablet; TAKE 1 TABLET BY MOUTH  EVERY 8 HOURS AS NEEDED FOR MUSCLE SPASM(S)  Dispense: 90 tablet; Refill: 1  7. DDD (degenerative disc disease), cervical   8. Need for shingles vaccine  - Zoster Vaccine Adjuvanted [facility]) injection; Inject 0.5 mLs into the muscle once for 1 dose.  Dispense: 0.5 mL; Refill: 1  9. Cervical radiculitis   10. Anxiety  - busPIRone (BUSPAR) 5 MG tablet; Take 1 tablet (5 mg total) by mouth 2 (two) times daily as needed.  Dispense: 60 tablet; Refill: 0  11. Primary osteoarthritis of first carpometacarpal joint of right hand   12. Benign essential HTN   13. Dyslipidemia

## 2021-08-07 ENCOUNTER — Other Ambulatory Visit: Payer: Self-pay

## 2021-08-07 ENCOUNTER — Ambulatory Visit (INDEPENDENT_AMBULATORY_CARE_PROVIDER_SITE_OTHER): Payer: Medicare Other | Admitting: Family Medicine

## 2021-08-07 ENCOUNTER — Encounter: Payer: Self-pay | Admitting: Family Medicine

## 2021-08-07 VITALS — BP 122/64 | HR 79 | Resp 16 | Ht 64.0 in | Wt 158.0 lb

## 2021-08-07 DIAGNOSIS — G2581 Restless legs syndrome: Secondary | ICD-10-CM | POA: Diagnosis not present

## 2021-08-07 DIAGNOSIS — M5412 Radiculopathy, cervical region: Secondary | ICD-10-CM

## 2021-08-07 DIAGNOSIS — E785 Hyperlipidemia, unspecified: Secondary | ICD-10-CM

## 2021-08-07 DIAGNOSIS — G8929 Other chronic pain: Secondary | ICD-10-CM

## 2021-08-07 DIAGNOSIS — I471 Supraventricular tachycardia: Secondary | ICD-10-CM | POA: Diagnosis not present

## 2021-08-07 DIAGNOSIS — M5030 Other cervical disc degeneration, unspecified cervical region: Secondary | ICD-10-CM

## 2021-08-07 DIAGNOSIS — F419 Anxiety disorder, unspecified: Secondary | ICD-10-CM

## 2021-08-07 DIAGNOSIS — D692 Other nonthrombocytopenic purpura: Secondary | ICD-10-CM

## 2021-08-07 DIAGNOSIS — M1811 Unilateral primary osteoarthritis of first carpometacarpal joint, right hand: Secondary | ICD-10-CM

## 2021-08-07 DIAGNOSIS — Z23 Encounter for immunization: Secondary | ICD-10-CM

## 2021-08-07 DIAGNOSIS — E1169 Type 2 diabetes mellitus with other specified complication: Secondary | ICD-10-CM

## 2021-08-07 DIAGNOSIS — I200 Unstable angina: Secondary | ICD-10-CM

## 2021-08-07 DIAGNOSIS — I10 Essential (primary) hypertension: Secondary | ICD-10-CM

## 2021-08-07 DIAGNOSIS — M545 Low back pain, unspecified: Secondary | ICD-10-CM

## 2021-08-07 MED ORDER — BUSPIRONE HCL 5 MG PO TABS: 5.0000 mg | ORAL_TABLET | Freq: Two times a day (BID) | ORAL | 0 refills | Status: DC | PRN

## 2021-08-07 MED ORDER — SHINGRIX 50 MCG/0.5ML IM SUSR: 0.5000 mL | Freq: Once | INTRAMUSCULAR | 1 refills | Status: AC

## 2021-08-07 MED ORDER — PRAMIPEXOLE DIHYDROCHLORIDE 0.5 MG PO TABS: 0.5000 mg | ORAL_TABLET | Freq: Every evening | ORAL | 1 refills | Status: DC | PRN

## 2021-08-07 MED ORDER — TIZANIDINE HCL 2 MG PO TABS: ORAL_TABLET | ORAL | 1 refills | Status: DC

## 2021-08-08 LAB — HEMOGLOBIN A1C
Hgb A1c MFr Bld: 6 % of total Hgb — ABNORMAL HIGH (ref <5.7)
Mean Plasma Glucose: 126 mg/dL
eAG (mmol/L): 7 mmol/L

## 2021-08-25 ENCOUNTER — Other Ambulatory Visit: Payer: Self-pay | Admitting: Cardiovascular Disease

## 2021-08-25 DIAGNOSIS — I10 Essential (primary) hypertension: Secondary | ICD-10-CM

## 2021-09-03 LAB — HM DIABETES EYE EXAM

## 2021-09-05 ENCOUNTER — Other Ambulatory Visit: Payer: Self-pay | Admitting: Cardiovascular Disease

## 2021-09-11 ENCOUNTER — Other Ambulatory Visit: Payer: Self-pay | Admitting: Family Medicine

## 2021-09-11 ENCOUNTER — Ambulatory Visit: Payer: Medicare Other | Admitting: Cardiovascular Disease

## 2021-09-11 DIAGNOSIS — F419 Anxiety disorder, unspecified: Secondary | ICD-10-CM

## 2021-09-13 ENCOUNTER — Encounter: Payer: Self-pay | Admitting: Cardiovascular Disease

## 2021-09-13 ENCOUNTER — Ambulatory Visit: Payer: Medicare Other | Admitting: Cardiovascular Disease

## 2021-09-13 ENCOUNTER — Other Ambulatory Visit: Payer: Self-pay

## 2021-09-13 VITALS — BP 130/78 | HR 56 | Ht 64.0 in | Wt 158.1 lb

## 2021-09-13 DIAGNOSIS — I471 Supraventricular tachycardia: Secondary | ICD-10-CM | POA: Diagnosis not present

## 2021-09-13 DIAGNOSIS — I340 Nonrheumatic mitral (valve) insufficiency: Secondary | ICD-10-CM | POA: Diagnosis not present

## 2021-09-13 DIAGNOSIS — E118 Type 2 diabetes mellitus with unspecified complications: Secondary | ICD-10-CM | POA: Diagnosis not present

## 2021-09-13 DIAGNOSIS — I4719 Other supraventricular tachycardia: Secondary | ICD-10-CM

## 2021-09-13 DIAGNOSIS — E785 Hyperlipidemia, unspecified: Secondary | ICD-10-CM

## 2021-09-13 DIAGNOSIS — I10 Essential (primary) hypertension: Secondary | ICD-10-CM | POA: Diagnosis not present

## 2021-09-13 DIAGNOSIS — Z794 Long term (current) use of insulin: Secondary | ICD-10-CM

## 2021-09-13 DIAGNOSIS — Z87891 Personal history of nicotine dependence: Secondary | ICD-10-CM

## 2021-09-13 MED ORDER — ATORVASTATIN CALCIUM 40 MG PO TABS: 40.0000 mg | ORAL_TABLET | Freq: Every day | ORAL | 3 refills | Status: DC

## 2021-09-13 MED ORDER — CARVEDILOL 25 MG PO TABS: 25.0000 mg | ORAL_TABLET | Freq: Two times a day (BID) | ORAL | 3 refills | Status: DC

## 2021-09-13 MED ORDER — ISOSORBIDE MONONITRATE ER 30 MG PO TB24: 30.0000 mg | ORAL_TABLET | Freq: Every day | ORAL | 3 refills | Status: DC

## 2021-09-13 MED ORDER — HYDRALAZINE HCL 25 MG PO TABS: 25.0000 mg | ORAL_TABLET | Freq: Three times a day (TID) | ORAL | 3 refills | Status: DC

## 2021-09-13 MED ORDER — CLONIDINE HCL 0.1 MG PO TABS: 0.1000 mg | ORAL_TABLET | Freq: Every evening | ORAL | 3 refills | Status: DC

## 2021-09-13 NOTE — Patient Instructions (Signed)
Medication Instructions:  No changes  If you need a refill on your cardiac medications before your next appointment, please call your pharmacy.   Lab work: No new labs needed  Testing/Procedures: No new testing needed  Follow-Up: At [facility], you and your health needs are our priority.  As part of our continuing mission to provide you with exceptional heart care, we have created designated Provider Care Teams.  These Care Teams include your primary Cardiologist (physician) and Advanced Practice Providers (APPs -  Physician Assistants and Nurse Practitioners) who all work together to provide you with the care you need, when you need it.  You will need a follow up appointment in 12 months  Providers on your designated Care Team:   Christopher Berge, NP Ryan Dunn, PA-C Cadence Furth, PA-C  COVID-19 Vaccine Information can be found at: [email or web address] For questions related to vaccine distribution or appointments, please email [email or web address] or call [phone].   

## 2021-09-13 NOTE — Progress Notes (Signed)
?  ?  ? ? ? ?Date:  09/13/2021  ? ?ID:  Alyssa Lawson, DOB 07-16-1945, MRN [id number] ? ?Patient Location:  ?[place] ?[facility] [place] [id number]  ? ?Provider location:   ?[place], [employer] office ? ?PCP:  Alyssa Sizer, MD  ?Cardiologist:  [facility] ? ?Chief Complaint  ?Patient presents with  ? 1 year follow up   ?  "Doing well." Medications reviewed by the patient verbally.   ? ? ? ?History of Present Illness:   ? ?Alyssa Lawson is a 76 y.o. female  past medical history of ?HTN ?Hyperlipidemia:  ?Metabolic Syndrome ?last [id number]  5.6% , down from 6.3,   ?Smoked, quit 1998, started age 89, 88 years, <1 ppd ?Cardiac catheterization November 2018, no significant disease ?Who presents for f/u of her abnormal EKG , chest pain, mitral valve regurgitation ? ?Last seen by myself in clinic March 2022 ? ?Husband with worsening dementia ?Has some supports, neighbors that can help ?"Melt down January", anxiety ?Started on buspirone ?Called elder care for [place] ? ?Labs reviewed ?HBA1C 6.0 ?Total chol 145, LDL 54 ? ?EKG personally reviewed by myself on todays visit ?Shows normal sinus rhythm rate 56 bpm T wave abnormality V4 through V6, inferior leads, seen previously ? ?Stress test 04/2017 ?Pharmacological myocardial perfusion imaging study with large region of ischemia in the mid to apical anteroseptal wall and entire lateral wall ?Normal wall motion, EF estimated at 63% ?No EKG changes concerning for ischemia at peak stress or in recovery. ?Poor exercise tolerance, 3: 30 min ?GI uptake artifact noted ?High risk scan ?  ?Cath 05/01/2017 ?False positive stress test ? MR noted ?Atrial tachycardia after the case, given metoprolol IV ?  ?Echo 05/01/2017 ?Mild to moderate MR ?EF >55 ?Small to moderate pericardial effusion ?  ? ?Past Medical History:  ?Diagnosis Date  ? Allergy ?  ? sinus bleeding and nose bleeding when i get out of bed in the AM  ? Anemia   ? Arthritis   ? Bladder disorder   ?  a. urinary incontinence  ? Cancer [facility])   ? cervical ca  ? CHF (congestive heart failure) ([place])   ? Coronary artery disease, non-occlusive   ? a. LHC 05/01/2017: LM no sig dz, LAD no sig dz, LCx no sig dz, RCA no sig dz, EF 55-65%, mod MR  ? Endometriosis   ? GERD (gastroesophageal reflux disease)   ? Hyperlipidemia   ? Hypertension   ? Mitral regurgitation   ? a. TTE 05/01/2017: EF 55-60%, no RWMA, not technically sufficient to allow for LV diastolic function, mild to moderate mitral regurgitation, mildly dilated left atrium, RV systolic function normal, PASP 35 mmHg, small to moderate pericardial effusion was noted  ? Restless leg syndrome   ? ?Past Surgical History:  ?Procedure Laterality Date  ? ABDOMINAL HYSTERECTOMY  1976  ? CARDIAC CATHETERIZATION    ? LEFT HEART CATH AND CORONARY ANGIOGRAPHY N/A 05/01/2017  ? Procedure: LEFT HEART CATH AND CORONARY ANGIOGRAPHY;  Surgeon: Minna Merritts, MD;  Location: [place] [facility];  Service: Cardiovascular;  Laterality: N/A;  ? OOPHORECTOMY    ?  ? ?Current Meds  ?Medication Sig  ? acetaminophen (TYLENOL) 500 MG tablet Take 1 tablet (500 mg total) by mouth every 6 (six) hours as needed for headache.  ? aspirin 81 MG chewable tablet Chew 1 tablet by mouth daily.  ? atorvastatin (LIPITOR) 40 MG tablet TAKE 1 TABLET BY MOUTH  DAILY  ? busPIRone (BUSPAR) 5 MG tablet TAKE 1 TABLET(5 MG) BY MOUTH TWICE DAILY AS NEEDED  ? carvedilol (COREG) 25 MG tablet TAKE 1 TABLET BY MOUTH  TWICE DAILY WITH A MEAL  ? cetirizine (ZYRTEC) 10 MG tablet Take 10 mg by mouth daily.  ? Cholecalciferol (VITAMIN D) 50 MCG (2000 UT) CAPS Take 1 capsule by mouth daily.  ? cloNIDine (CATAPRES) 0.1 MG tablet Take 1 tablet (0.1 mg total) by mouth every evening.  ? Cyanocobalamin (VITAMIN B-12) 2000 MCG TBCR Take by mouth daily.  ? ferrous sulfate 325 (65 FE) MG tablet Take 325 mg by mouth daily with breakfast.  ? fluticasone (FLONASE) 50 MCG/ACT nasal spray USE 1 SPRAY IN EACH NOSTRIL TWO TIMES DAILY   ? hydrALAZINE (APRESOLINE) 25 MG tablet Take 1 tablet (25 mg total) by mouth 3 (three) times daily.  ? isosorbide mononitrate (IMDUR) 30 MG 24 hr tablet Take 1 tablet (30 mg total) by mouth daily. Please schedule office visit for further refills. Thank you!  ? MAGNESIUM-ZINC PO Take by mouth daily.  ? Multiple Vitamin (MULTIVITAMIN) capsule Take 1 capsule by mouth daily.  ? olmesartan (BENICAR) 40 MG tablet TAKE 1 TABLET BY MOUTH  DAILY  ? oxybutynin (DITROPAN) 5 MG tablet TAKE 1 TABLET BY MOUTH  TWICE DAILY  ? pramipexole (MIRAPEX) 0.5 MG tablet Take 1 tablet (0.5 mg total) by mouth at bedtime as needed. In place of Requip  ? tiZANidine (ZANAFLEX) 2 MG tablet TAKE 1 TABLET BY MOUTH  EVERY 8 HOURS AS NEEDED FOR MUSCLE SPASM(S)  ? valACYclovir (VALTREX) 500 MG tablet TAKE 1 TABLET BY MOUTH 2  TIMES DAILY. PER EPISODE AS NEEDED, TAKE FOR 10 DAYS  ? vitamin C (ASCORBIC ACID) 500 MG tablet Take 500 mg by mouth daily.  ?  ? ?Allergies:   Amlodipine and Meloxicam  ? ?Social History  ? ?Tobacco Use  ? Smoking status: Former  ?  Packs/day: 0.50  ?  Years: 18.00  ?  Pack years: 9.00  ?  Types: Cigarettes  ?  Start date: 06/23/1956  ?  Quit date: 11/22/1995  ?  Years since quitting: 25.8  ? Smokeless tobacco: Former  ?  Quit date: 11/22/1996  ? Tobacco comments:  ?  None  ?Vaping Use  ? Vaping Use: Never used  ?Substance Use Topics  ? Alcohol use: Yes  ?  Alcohol/week: 1.0 standard drink  ?  Types: 1 Glasses of wine per week  ?  Comment: glass of wine occasionally maybe once a month  ? Drug use: No  ?  ? ?Current Outpatient Medications on File Prior to Visit  ?Medication Sig Dispense Refill  ? acetaminophen (TYLENOL) 500 MG tablet Take 1 tablet (500 mg total) by mouth every 6 (six) hours as needed for headache. 30 tablet 0  ? aspirin 81 MG chewable tablet Chew 1 tablet by mouth daily.    ? atorvastatin (LIPITOR) 40 MG tablet TAKE 1 TABLET BY MOUTH  DAILY 90 tablet 2  ? busPIRone (BUSPAR) 5 MG tablet TAKE 1 TABLET(5 MG) BY MOUTH  TWICE DAILY AS NEEDED 60 tablet 0  ? carvedilol (COREG) 25 MG tablet TAKE 1 TABLET BY MOUTH  TWICE DAILY WITH A MEAL 180 tablet 0  ? cetirizine (ZYRTEC) 10 MG tablet Take 10 mg by mouth daily.    ? Cholecalciferol (VITAMIN D) 50 MCG (2000 UT) CAPS Take 1 capsule by mouth daily.    ? cloNIDine (CATAPRES) 0.1 MG tablet Take  1 tablet (0.1 mg total) by mouth every evening. 90 tablet 3  ? Cyanocobalamin (VITAMIN B-12) 2000 MCG TBCR Take by mouth daily.    ? ferrous sulfate 325 (65 FE) MG tablet Take 325 mg by mouth daily with breakfast.    ? fluticasone (FLONASE) 50 MCG/ACT nasal spray USE 1 SPRAY IN EACH NOSTRIL TWO TIMES DAILY 48 g 1  ? hydrALAZINE (APRESOLINE) 25 MG tablet Take 1 tablet (25 mg total) by mouth 3 (three) times daily. 347 tablet 3  ? isosorbide mononitrate (IMDUR) 30 MG 24 hr tablet Take 1 tablet (30 mg total) by mouth daily. Please schedule office visit for further refills. Thank you! 90 tablet 3  ? MAGNESIUM-ZINC PO Take by mouth daily.    ? Multiple Vitamin (MULTIVITAMIN) capsule Take 1 capsule by mouth daily.    ? olmesartan (BENICAR) 40 MG tablet TAKE 1 TABLET BY MOUTH  DAILY 90 tablet 0  ? oxybutynin (DITROPAN) 5 MG tablet TAKE 1 TABLET BY MOUTH  TWICE DAILY 180 tablet 3  ? pramipexole (MIRAPEX) 0.5 MG tablet Take 1 tablet (0.5 mg total) by mouth at bedtime as needed. In place of Requip 90 tablet 1  ? tiZANidine (ZANAFLEX) 2 MG tablet TAKE 1 TABLET BY MOUTH  EVERY 8 HOURS AS NEEDED FOR MUSCLE SPASM(S) 90 tablet 1  ? valACYclovir (VALTREX) 500 MG tablet TAKE 1 TABLET BY MOUTH 2  TIMES DAILY. PER EPISODE AS NEEDED, TAKE FOR 10 DAYS 90 tablet 0  ? vitamin C (ASCORBIC ACID) 500 MG tablet Take 500 mg by mouth daily.    ? ?No current facility-administered medications on file prior to visit.  ?  ? ?Family Hx: ?The patient's family history includes Arrhythmia in her father and sister; Breast cancer (age of onset: 30) in her paternal aunt; Cardiomyopathy in her sister; Congestive Heart Failure in her  brother; Diabetes in her brother; Heart failure in her father; Hypertension in her brother, father, and sister. ? ?ROS:   ?Please see the history of present illness.    ?Review of Systems  ?Constitutional: Ne

## 2021-10-23 ENCOUNTER — Encounter: Payer: Self-pay | Admitting: Family Medicine

## 2021-10-23 ENCOUNTER — Ambulatory Visit (INDEPENDENT_AMBULATORY_CARE_PROVIDER_SITE_OTHER): Payer: Medicare Other | Admitting: Family Medicine

## 2021-10-23 VITALS — BP 120/68 | HR 90 | Temp 98.2°F | Resp 16 | Ht 64.0 in | Wt 159.0 lb

## 2021-10-23 DIAGNOSIS — J310 Chronic rhinitis: Secondary | ICD-10-CM

## 2021-10-23 DIAGNOSIS — J329 Chronic sinusitis, unspecified: Secondary | ICD-10-CM

## 2021-10-23 MED ORDER — RYALTRIS 665-25 MCG/ACT NA SUSP: 2.0000 | Freq: Every day | NASAL | 5 refills | Status: DC

## 2021-10-23 MED ORDER — AMOXICILLIN-POT CLAVULANATE 875-125 MG PO TABS: 1.0000 | ORAL_TABLET | Freq: Two times a day (BID) | ORAL | 0 refills | Status: AC

## 2021-10-23 NOTE — Patient Instructions (Signed)
Please continue your once daily allergy pill and nasal spray, continue saline nasal spray and try to get a mister/humidifier in your room so you don't get too dry at night. ? ? ? ?Sinus Infection, Adult ?A sinus infection is soreness and swelling (inflammation) of your sinuses. Sinuses are hollow spaces in the bones around your face. They are located: ?Around your eyes. ?In the middle of your forehead. ?Behind your nose. ?In your cheekbones. ?Your sinuses and nasal passages are lined with a fluid called mucus. Mucus drains out of your sinuses. Swelling can trap mucus in your sinuses. This lets germs (bacteria, virus, or fungus) grow, which leads to infection. Most of the time, this condition is caused by a virus. ?What are the causes? ?Allergies. ?Asthma. ?Germs. ?Things that block your nose or sinuses. ?Growths in the nose (nasal polyps). ?Chemicals or irritants in the air. ?A fungus. This is rare. ?What increases the risk? ?Having a weak body defense system (immune system). ?Doing a lot of swimming or diving. ?Using nasal sprays too much. ?Smoking. ?What are the signs or symptoms? ?The main symptoms of this condition are pain and a feeling of pressure around the sinuses. Other symptoms include: ?Stuffy nose (congestion). This may make it hard to breathe through your nose. ?Runny nose (drainage). ?Soreness, swelling, and warmth in the sinuses. ?A cough that may get worse at night. ?Being unable to smell and taste. ?Mucus that collects in the throat or the back of the nose (postnasal drip). This may cause a sore throat or bad breath. ?Being very tired (fatigued). ?A fever. ?How is this diagnosed? ?Your symptoms. ?Your medical history. ?A physical exam. ?Tests to find out if your condition is short-term (acute) or long-term (chronic). Your doctor may: ?Check your nose for growths (polyps). ?Check your sinuses using a tool that has a light on one end (endoscope). ?Check for allergies or germs. ?Do imaging tests, such  as an MRI or CT scan. ?How is this treated? ?Treatment for this condition depends on the cause and whether it is short-term or long-term. ?If caused by a virus, your symptoms should go away on their own within 10 days. You may be given medicines to relieve symptoms. They include: ?Medicines that shrink swollen tissue in the nose. ?A spray that treats swelling of the nostrils. ?Rinses that help get rid of thick mucus in your nose (nasal saline washes). ?Medicines that treat allergies (antihistamines). ?Over-the-counter pain relievers. ?If caused by bacteria, your doctor may wait to see if you will get better without treatment. You may be given antibiotic medicine if you have: ?A very bad infection. ?A weak body defense system. ?If caused by growths in the nose, surgery may be needed. ?Follow these instructions at home: ?Medicines ?Take, use, or apply over-the-counter and prescription medicines only as told by your doctor. These may include nasal sprays. ?If you were prescribed an antibiotic medicine, take it as told by your doctor. Do not stop taking it even if you start to feel better. ?Hydrate and humidify ? ?Drink enough water to keep your pee (urine) pale yellow. ?Use a cool mist humidifier to keep the humidity level in your home above 50%. ?Breathe in steam for 10-15 minutes, 3-4 times a day, or as told by your doctor. You can do this in the bathroom while a hot shower is running. ?Try not to spend time in cool or dry air. ?Rest ?Rest as much as you can. ?Sleep with your head raised (elevated). ?Make sure you get  enough sleep each night. ?General instructions ? ?Put a warm, moist washcloth on your face 3-4 times a day, or as often as told by your doctor. ?Use nasal saline washes as often as told by your doctor. ?Wash your hands often with soap and water. If you cannot use soap and water, use hand sanitizer. ?Do not smoke. Avoid being around people who are smoking (secondhand smoke). ?Keep all follow-up  visits. ?Contact a doctor if: ?You have a fever. ?Your symptoms get worse. ?Your symptoms do not get better within 10 days. ?Get help right away if: ?You have a very bad headache. ?You cannot stop vomiting. ?You have very bad pain or swelling around your face or eyes. ?You have trouble seeing. ?You feel confused. ?Your neck is stiff. ?You have trouble breathing. ?These symptoms may be an emergency. Get help right away. Call 911. ?Do not wait to see if the symptoms will go away. ?Do not drive yourself to the hospital. ?Summary ?A sinus infection is swelling of your sinuses. Sinuses are hollow spaces in the bones around your face. ?This condition is caused by tissues in your nose that become inflamed or swollen. This traps germs. These can lead to infection. ?If you were prescribed an antibiotic medicine, take it as told by your doctor. Do not stop taking it even if you start to feel better. ?Keep all follow-up visits. ?This information is not intended to replace advice given to you by your health care provider. Make sure you discuss any questions you have with your health care provider. ?Document Revised: 05/14/2021 Document Reviewed: 05/14/2021 ?Elsevier Patient Education ? [place]. ? ?

## 2021-10-23 NOTE — Progress Notes (Signed)
? ? ?Patient ID: Alyssa Lawson, female    DOB: 1946/05/23, 76 y.o.   MRN: [id number] ? ?PCP: Steele Sizer, MD ? ?Chief Complaint  ?Patient presents with  ? Nasal Congestion  ?  X3 weeks  ? Sinusitis  ?  X3 weeks  ? Dizziness  ?  This morning  ? ? ?Subjective:  ? ?Alyssa Lawson is a 76 y.o. female, presents to clinic with CC of the following: ? ?Severe nasal congestion x 3 weeks, mostly nasal congestion, blowing nose with irritation/blood in nasal discharge, she's feeling weak, tired, decreased appetite, mild headache, some teeth pain  ?She has tried Tylenol sinus and allergy, flonase allergy pills, nasal saline and she reports taking her cetirizine and changing it to Claritin and using Flonase but also reports no history of seasonal allergies.  She also notes that when she goes outside if she is wearing a mask does seem to minimize her symptoms ?She denies fever chills sweats ? ? ?Sinusitis ?This is a new problem. The current episode started 1 to 4 weeks ago. The problem has been gradually worsening since onset. There has been no fever. Associated symptoms include congestion, headaches, a hoarse voice, sinus pressure, sneezing and a sore throat. Pertinent negatives include no chills, coughing, diaphoresis, ear pain, neck pain, shortness of breath or swollen glands. Past treatments include saline sprays. The treatment provided no relief.   ? ? ?She endorses decreased appetite but has not had any weight changes ?She had some dizzy episodes since her sinus congestion began ?Sometimes when blowing her nose clearing the very dried mucus causes her to feel little bit of nausea ? ? ?Weights reviewed ?Wt Readings from Last 5 Encounters:  ?10/23/21 159 lb (72.1 kg)  ?09/13/21 158 lb 2 oz (71.7 kg)  ?08/07/21 158 lb (71.7 kg)  ?04/05/21 158 lb (71.7 kg)  ?12/11/20 158 lb 1.1 oz (71.7 kg)  ? ?BMI Readings from Last 5 Encounters:  ?10/23/21 27.29 kg/m?  ?09/13/21 27.14 kg/m?  ?08/07/21 27.12 kg/m?  ?04/05/21 27.12 kg/m?   ?12/11/20 27.13 kg/m?  ? ? ? ? ? ?Patient Active Problem List  ? Diagnosis Date Noted  ? Senile purpura ([place]) 04/05/2021  ? Cervical radiculitis 04/05/2021  ? DDD (degenerative disc disease), cervical 04/05/2021  ? Chronic pain of right thumb 04/05/2021  ? Type 2 diabetes mellitus with complication, with long-term current use of insulin ([place]) 11/09/2018  ? Mixed hyperlipidemia 11/09/2018  ? Smoker 11/09/2018  ? Atrial tachycardia ([place]) 08/25/2017  ? Unstable angina ([place]) 05/01/2017  ? Positive cardiac stress test 05/01/2017  ? Abnormal EKG 04/24/2017  ? Smoking history 04/24/2017  ? Rotator cuff tendinitis, left 04/10/2017  ? Cataracts, bilateral 11/25/2016  ? Impingement syndrome of shoulder, left 10/13/2016  ? Chronic bilateral low back pain without sciatica 08/20/2016  ? Metabolic syndrome 02/72/5366  ? Mitral regurgitation 06/22/2015  ? Melasma 12/21/2014  ? History of hysterectomy 12/21/2014  ? Restless leg syndrome 12/21/2014  ? Benign essential HTN 12/17/2014  ? Cataract 12/17/2014  ? Dyslipidemia 12/17/2014  ? Gastric reflux 12/17/2014  ? History of cervical cancer 12/17/2014  ? H/O iron deficiency anemia 12/17/2014  ? Urinary incontinence in female 12/17/2014  ? Dysmetabolic syndrome 44/08/4740  ? TI (tricuspid incompetence) 12/17/2014  ? Osteopenia of the elderly 12/17/2014  ? ? ? ? ?Current Outpatient Medications:  ?  acetaminophen (TYLENOL) 500 MG tablet, Take 1 tablet (500 mg total) by mouth every 6 (six) hours as needed for headache., Disp: 30 tablet,  Rfl: 0 ?  aspirin 81 MG chewable tablet, Chew 1 tablet by mouth daily., Disp: , Rfl:  ?  atorvastatin (LIPITOR) 40 MG tablet, Take 1 tablet (40 mg total) by mouth daily., Disp: 90 tablet, Rfl: 3 ?  busPIRone (BUSPAR) 5 MG tablet, TAKE 1 TABLET(5 MG) BY MOUTH TWICE DAILY AS NEEDED, Disp: 60 tablet, Rfl: 0 ?  carvedilol (COREG) 25 MG tablet, Take 1 tablet (25 mg total) by mouth 2 (two) times daily with a meal., Disp: 180 tablet, Rfl: 3 ?  cetirizine  (ZYRTEC) 10 MG tablet, Take 10 mg by mouth daily., Disp: , Rfl:  ?  Cholecalciferol (VITAMIN D) 50 MCG (2000 UT) CAPS, Take 1 capsule by mouth daily., Disp: , Rfl:  ?  cloNIDine (CATAPRES) 0.1 MG tablet, Take 1 tablet (0.1 mg total) by mouth every evening., Disp: 90 tablet, Rfl: 3 ?  Cyanocobalamin (VITAMIN B-12) 2000 MCG TBCR, Take by mouth daily., Disp: , Rfl:  ?  ferrous sulfate 325 (65 FE) MG tablet, Take 325 mg by mouth daily with breakfast., Disp: , Rfl:  ?  fluticasone (FLONASE) 50 MCG/ACT nasal spray, USE 1 SPRAY IN EACH NOSTRIL TWO TIMES DAILY, Disp: 48 g, Rfl: 1 ?  hydrALAZINE (APRESOLINE) 25 MG tablet, Take 1 tablet (25 mg total) by mouth 3 (three) times daily., Disp: 270 tablet, Rfl: 3 ?  isosorbide mononitrate (IMDUR) 30 MG 24 hr tablet, Take 1 tablet (30 mg total) by mouth daily. Please schedule office visit for further refills. Thank you!, Disp: 90 tablet, Rfl: 3 ?  MAGNESIUM-ZINC PO, Take by mouth daily., Disp: , Rfl:  ?  Multiple Vitamin (MULTIVITAMIN) capsule, Take 1 capsule by mouth daily., Disp: , Rfl:  ?  olmesartan (BENICAR) 40 MG tablet, TAKE 1 TABLET BY MOUTH  DAILY, Disp: 90 tablet, Rfl: 0 ?  oxybutynin (DITROPAN) 5 MG tablet, TAKE 1 TABLET BY MOUTH  TWICE DAILY, Disp: 180 tablet, Rfl: 3 ?  pramipexole (MIRAPEX) 0.5 MG tablet, Take 1 tablet (0.5 mg total) by mouth at bedtime as needed. In place of Requip, Disp: 90 tablet, Rfl: 1 ?  tiZANidine (ZANAFLEX) 2 MG tablet, TAKE 1 TABLET BY MOUTH  EVERY 8 HOURS AS NEEDED FOR MUSCLE SPASM(S), Disp: 90 tablet, Rfl: 1 ?  valACYclovir (VALTREX) 500 MG tablet, TAKE 1 TABLET BY MOUTH 2  TIMES DAILY. PER EPISODE AS NEEDED, TAKE FOR 10 DAYS, Disp: 90 tablet, Rfl: 0 ?  vitamin C (ASCORBIC ACID) 500 MG tablet, Take 500 mg by mouth daily., Disp: , Rfl:  ? ? ?Allergies  ?Allergen Reactions  ? Amlodipine Swelling  ? Meloxicam Palpitations  ? ? ? ?Social History  ? ?Tobacco Use  ? Smoking status: Former  ?  Packs/day: 0.50  ?  Years: 18.00  ?  Pack years: 9.00  ?   Types: Cigarettes  ?  Start date: 06/23/1956  ?  Quit date: 11/22/1995  ?  Years since quitting: 25.9  ? Smokeless tobacco: Former  ?  Quit date: 11/22/1996  ? Tobacco comments:  ?  None  ?Vaping Use  ? Vaping Use: Never used  ?Substance Use Topics  ? Alcohol use: Yes  ?  Alcohol/week: 1.0 standard drink  ?  Types: 1 Glasses of wine per week  ?  Comment: glass of wine occasionally maybe once a month  ? Drug use: No  ?  ? ? ?Chart Review Today: ?I personally reviewed active problem list, medication list, allergies, family history, social history, health maintenance, notes from  last encounter, lab results, imaging with the patient/caregiver today. ? ? ?Review of Systems  ?Constitutional:  Positive for appetite change and fatigue. Negative for activity change, chills, diaphoresis, fever and unexpected weight change.  ?HENT:  Positive for congestion, hoarse voice, sinus pressure, sneezing and sore throat. Negative for ear pain.   ?Eyes: Negative.   ?Respiratory: Negative.  Negative for cough and shortness of breath.   ?Cardiovascular: Negative.   ?Gastrointestinal:  Positive for nausea. Negative for abdominal pain.  ?Endocrine: Negative.   ?Genitourinary: Negative.   ?Musculoskeletal: Negative.  Negative for neck pain.  ?Skin: Negative.   ?Allergic/Immunologic: Negative.   ?Neurological:  Positive for headaches.  ?Hematological: Negative.   ?Psychiatric/Behavioral: Negative.    ?All other systems reviewed and are negative. ? ?   ?Objective:  ? ?Vitals:  ? 10/23/21 1520  ?BP: 120/68  ?Pulse: 90  ?Resp: 16  ?Temp: 98.2 ?F (36.8 ?C)  ?SpO2: 93%  ?Weight: 159 lb (72.1 kg)  ?Height: '5\' 4"'$  (1.626 m)  ?  ?Body mass index is 27.29 kg/m?. ? ?Physical Exam ?Vitals and nursing note reviewed.  ?Constitutional:   ?   General: She is not in acute distress. ?   Appearance: Normal appearance. She is normal weight. She is not ill-appearing, toxic-appearing or diaphoretic.  ?HENT:  ?   Head: Normocephalic and atraumatic.  ?   Right Ear:  Tympanic membrane, ear canal and external ear normal. There is no impacted cerumen.  ?   Left Ear: Tympanic membrane, ear canal and external ear normal. There is no impacted cerumen.  ?   Nose: Congestion and rhino

## 2021-10-25 ENCOUNTER — Other Ambulatory Visit: Payer: Self-pay | Admitting: Family Medicine

## 2021-10-25 DIAGNOSIS — Z1231 Encounter for screening mammogram for malignant neoplasm of breast: Secondary | ICD-10-CM

## 2021-11-04 ENCOUNTER — Other Ambulatory Visit: Payer: Self-pay | Admitting: Family Medicine

## 2021-11-04 ENCOUNTER — Ambulatory Visit
Admission: RE | Admit: 2021-11-04 | Discharge: 2021-11-04 | Disposition: A | Discharge status: Home / Self Care | Payer: Medicare Other | Source: Ambulatory Visit | Attending: Family Medicine | Admitting: Family Medicine

## 2021-11-04 DIAGNOSIS — Z1231 Encounter for screening mammogram for malignant neoplasm of breast: Secondary | ICD-10-CM | POA: Diagnosis not present

## 2021-11-04 DIAGNOSIS — F419 Anxiety disorder, unspecified: Secondary | ICD-10-CM

## 2021-11-18 ENCOUNTER — Other Ambulatory Visit: Payer: Self-pay | Admitting: Cardiovascular Disease

## 2021-11-18 ENCOUNTER — Other Ambulatory Visit: Payer: Self-pay | Admitting: Family Medicine

## 2021-11-18 DIAGNOSIS — N3941 Urge incontinence: Secondary | ICD-10-CM

## 2021-11-18 DIAGNOSIS — I10 Essential (primary) hypertension: Secondary | ICD-10-CM

## 2021-11-19 ENCOUNTER — Other Ambulatory Visit: Payer: Self-pay

## 2021-11-19 DIAGNOSIS — N3941 Urge incontinence: Secondary | ICD-10-CM

## 2021-11-19 MED ORDER — OXYBUTYNIN CHLORIDE 5 MG PO TABS: 5.0000 mg | ORAL_TABLET | Freq: Two times a day (BID) | ORAL | 0 refills | Status: DC

## 2021-12-02 ENCOUNTER — Other Ambulatory Visit: Payer: Self-pay | Admitting: Internal Medicine

## 2021-12-02 DIAGNOSIS — N3941 Urge incontinence: Secondary | ICD-10-CM

## 2021-12-03 NOTE — Progress Notes (Unsigned)
Name: Alyssa Lawson   MRN: [id number]    DOB: 1946/01/03   Date:12/04/2021       Progress Note  Subjective  Chief Complaint  Follow up   HPI  HTN: taking coreg 53m daily, Benicar 40 , hydralazine 25 mg TID ( down from 50 QID )  clonidine qhs. BP is controlled.  She has been doing well lately   Hyperlipidemia: last lipid panel was at goal, LDL was 54 . She is taking atorvastatin and denies muscle aches.   DMII: diagnosed with diabetes years ago, since 2015  She stopped taking metformin due to pt c/o swelling and weight gain with medication. Pt was tarted Trulicity back in 075/9163but stopped because of cost. She is on diet only at this time, A1C had gone up again to 6.3 % but she has changed her diet, A1C is down to 6 %    Urinary incontinence: she has been on Ditropan , tolerating medication well, nocturia at most once at nigh.   Senile purpura: stable and reassurance given . Unchanged    RLS: better with medication, she was on Requip and we switched to MMilroyand she states she has been able to sleep well    Chronic Back Pain: she has daily low back pain, She takes Tylenol prn, and Tizanidine and would like refills today    Unstable angina: indigestion symptoms with abnormal EKG, normal cardiac cath done Nov 2018 by Dr. GRockey Situ on higher dose of Coreg, and Benicar also on Imdur and indigestion resolved, therefore symptoms controlled with medication management   Also on statin therapy and aspirin Doing well   Atrial tachycardia: no recent episodes of palpitation ,  she is on beta blocker and is rate controlled   Stress/Anxiety : she states her husband has dementia, is paranoid, blames her for everything , he is now voiding all over the house, explained importance of placing him on a long term facility, she has not discussed with lawyer to get power of attorney   [facility] rad[facility]: [facility] she states Tizanidine helps with neck spasms. She had C-spine x-ray that  showed DDD. .Marland KitchenNo tingling down her arm, but has intermittent tingling on carpal tunnel distribution every night. She states gabapentin did not work, we gave her lyrica but she it no longer taking it and symptoms have improved   Hand pain: both hands, proximal interphalangeal joints getting swollen int he evening - at the end of the day-  and stiff but not for a long time, right hand is worse than left . Explained likely OA. She has been using topical medication and seems to help. Tylenol does not work She asked to be referred to Rheumatologist but explained likely nothing else to be done at this time. Husband has dementia and she is constantly having to clean doe laundry due to his urinary incontinence and it is likely the cause of worsening of symptoms.    Patient Active Problem List   Diagnosis Date Noted   Senile purpura ([place] 04/05/2021   Cervical radiculitis 04/05/2021   DDD (degenerative disc disease), cervical 04/05/2021   Chronic pain of right thumb 04/05/2021   Type 2 diabetes mellitus with complication, with long-term current use of insulin ([place] 11/09/2018   Mixed hyperlipidemia 11/09/2018   Smoker 11/09/2018   Atrial tachy[place] ([place] 08/25/2017   Unstable angina ([place] 05/01/2017   Positive cardiac stress test 05/01/2017   Abnormal EKG 04/24/2017   Smoking history  04/24/2017   Rotator cuff tendinitis, left 04/10/2017   Cataracts, bilateral 11/25/2016   Impingement syndrome of shoulder, left 10/13/2016   Chronic bilateral low back pain without sciatica 92/42/6834   Metabolic syndrome 19/62/2297   Mitral regurgitation 06/22/2015   Melasma 12/21/2014   History of hysterectomy 12/21/2014   Restless leg syndrome 12/21/2014   Benign essential HTN 12/17/2014   Cataract 12/17/2014   Dyslipidemia 12/17/2014   Gastric reflux 12/17/2014   History of cervical cancer 12/17/2014   H/O iron deficiency anemia 12/17/2014   Urinary incontinence in female 98/92/1194   Dysmetabolic  syndrome 17/40/8144   TI (tricuspid incompetence) 12/17/2014   Osteopenia of the elderly 12/17/2014    Past Surgical History:  Procedure Laterality Date   ABDOMINAL HYSTERECTOMY  1976   CARDIAC CATHETERIZATION     LEFT HEART CATH AND CORONARY ANGIOGRAPHY N/A 05/01/2017   Procedure: LEFT HEART CATH AND CORONARY ANGIOGRAPHY;  Surgeon: Minna Merritts, MD;  Location: [facility];  Service: Cardiovascular;  Laterality: N/A;   OOPHORECTOMY      Family History  Problem Relation Age of Onset   Heart failure Father    Arrhythmia Father    Hypertension Father    Arrhythmia Sister    Hypertension Sister    Congestive Heart Failure Brother    Hypertension Brother    Cardiomyopathy Sister    Diabetes Brother    Breast cancer Paternal Aunt 60    Social History   Tobacco Use   Smoking status: Former    Packs/day: 0.50    Years: 18.00    Total pack years: 9.00    Types: Cigarettes    Start date: 06/23/1956    Quit date: 11/22/1995    Years since quitting: 26.0   Smokeless tobacco: Former    Quit date: 11/22/1996   Tobacco comments:    None  Substance Use Topics   Alcohol use: Yes    Alcohol/week: 1.0 standard drink of alcohol    Types: 1 Glasses of wine per week    Comment: glass of wine occasionally maybe once a month     Current Outpatient Medications:    acetaminophen (TYLENOL) 500 MG tablet, Take 1 tablet (500 mg total) by mouth every 6 (six) hours as needed for headache., Disp: 30 tablet, Rfl: 0   aspirin 81 MG chewable tablet, Chew 1 tablet by mouth daily., Disp: , Rfl:    atorvastatin (LIPITOR) 40 MG tablet, Take 1 tablet (40 mg total) by mouth daily., Disp: 90 tablet, Rfl: 3   carvedilol (COREG) 25 MG tablet, Take 1 tablet (25 mg total) by mouth 2 (two) times daily with a meal., Disp: 180 tablet, Rfl: 3   cetirizine (ZYRTEC) 10 MG tablet, Take 10 mg by mouth daily., Disp: , Rfl:    Cholecalciferol (VITAMIN D) 50 MCG (2000 UT) CAPS, Take 1 capsule by mouth  daily., Disp: , Rfl:    cloNIDine (CATAPRES) 0.1 MG tablet, Take 1 tablet (0.1 mg total) by mouth every evening., Disp: 90 tablet, Rfl: 3   Cyanocobalamin (VITAMIN B-12) 2000 MCG TBCR, Take by mouth daily., Disp: , Rfl:    ferrous sulfate 325 (65 FE) MG tablet, Take 325 mg by mouth daily with breakfast., Disp: , Rfl:    FLOWFLEX COVID-19 AG HOME TEST KIT, TEST AS DIRECTED PER MANUFACTURER AND CDC GUIDANCE, Disp: , Rfl:    hydrALAZINE (APRESOLINE) 25 MG tablet, Take 1 tablet (25 mg total) by mouth 3 (three) times daily., Disp: 270 tablet,  Rfl: 3   isosorbide mononitrate (IMDUR) 30 MG 24 hr tablet, Take 1 tablet (30 mg total) by mouth daily. Please schedule office visit for further refills. Thank you!, Disp: 90 tablet, Rfl: 3   MAGNESIUM-ZINC PO, Take by mouth daily., Disp: , Rfl:    Multiple Vitamin (MULTIVITAMIN) capsule, Take 1 capsule by mouth daily., Disp: , Rfl:    olmesartan (BENICAR) 40 MG tablet, TAKE 1 TABLET BY MOUTH DAILY, Disp: 90 tablet, Rfl: 3   Olopatadine-Mometasone (RYALTRIS) 665-25 MCG/ACT SUSP, Place 2 sprays into both nostrils daily., Disp: 9 g, Rfl: 5   pramipexole (MIRAPEX) 0.5 MG tablet, Take 1 tablet (0.5 mg total) by mouth at bedtime as needed. In place of Requip, Disp: 90 tablet, Rfl: 1   valACYclovir (VALTREX) 500 MG tablet, TAKE 1 TABLET BY MOUTH 2  TIMES DAILY. PER EPISODE AS NEEDED, TAKE FOR 10 DAYS, Disp: 90 tablet, Rfl: 0   vitamin C (ASCORBIC ACID) 500 MG tablet, Take 500 mg by mouth daily., Disp: , Rfl:    busPIRone (BUSPAR) 5 MG tablet, TAKE 1 TABLET(5 MG) BY MOUTH TWICE DAILY AS NEEDED, Disp: 180 tablet, Rfl: 1   oxybutynin (DITROPAN) 5 MG tablet, TAKE 1 TABLET(5 MG) BY MOUTH TWICE DAILY, Disp: 180 tablet, Rfl: 1   tiZANidine (ZANAFLEX) 2 MG tablet, TAKE 1 TABLET BY MOUTH  EVERY 8 HOURS AS NEEDED FOR MUSCLE SPASM(S), Disp: 90 tablet, Rfl: 1  Allergies  Allergen Reactions   Amlodipine Swelling   Meloxicam Palpitations    I personally reviewed active problem  list, medication list, allergies, family history, social history, health maintenance with the patient/caregiver today.   ROS  Constitutional: Negative for fever or weight change.  Respiratory: Negative for cough and shortness of breath.   Cardiovascular: Negative for chest pain or palpitations.  Gastrointestinal: Negative for abdominal pain, no bowel changes.  Musculoskeletal: Negative for gait problem or joint swelling.  Skin: Negative for rash.  Neurological: Negative for dizziness or headache.  No other specific complaints in a complete review of systems (except as listed in HPI above).   Objective  Vitals:   12/04/21 1401  BP: 122/72  Pulse: 79  Resp: 16  SpO2: 97%  Weight: 159 lb (72.1 kg)  Height: _0  (1.626 m)    Body mass index is 27.29 kg/m.  Physical Exam  Constitutional: Patient appears well-developed and well-nourished. No distress.  HEENT: head atraumatic, normocephalic, pupils equal and reactive to light, neck supple Cardiovascular: Normal rate, regular rhythm and normal heart sounds.  No murmur heard. No BLE edema. Pulmonary/Chest: Effort normal and breath sounds normal. No respiratory distress. Abdominal: Soft.  There is no tenderness. Psychiatric: Patient has a normal mood and affect. behavior is normal. Judgment and thought content normal.   Recent Results (from the past 2160 hour(s))  POCT HgB A1C     Status: Abnormal   Collection Time: 12/04/21  2:02 PM  Result Value Ref Range   Hemoglobin A1C 6.0 (A) 4.0 - 5.6 %   HbA1c POC (<> result, manual entry)     HbA1c, POC (prediabetic range)     HbA1c, POC (controlled diabetic range)      PHQ2/9:    12/04/2021    2:01 PM 10/23/2021    3:19 PM 08/07/2021    1:38 PM 04/23/2021    3:09 PM 04/05/2021    2:24 PM  Depression screen PHQ 2/9  Decreased Interest 0 0 0 0 0  Down, Depressed, Hopeless 0 0 0 0 0  PHQ - 2 Score 0 0 0 0 0  Altered sleeping 0 0 0  0  Tired, decreased energy 0 3 0  0  Change in  appetite 0 3 0  0  Feeling bad or failure about yourself  0 0 0  0  Trouble concentrating 0 0 0  0  Moving slowly or fidgety/restless 0 0 0  0  Suicidal thoughts 0 0 0  0  PHQ-9 Score 0 6 0  0    phq 9 is negative   Fall Risk:    12/04/2021    2:01 PM 10/23/2021    3:19 PM 08/07/2021    1:37 PM 04/23/2021    3:12 PM 04/05/2021    2:24 PM  Fall Risk   Falls in the past year? 0 0 0 0 0  Number falls in past yr: 0 0 0 0 0  Injury with Fall? 0 0 0 0 0  Risk for fall due to : _0   Follow up _1      Functional Status Survey: Is the patient deaf or have difficulty hearing?: No Does the patient have difficulty seeing, even when wearing glasses/contacts?: Yes Does the patient have difficulty concentrating, remembering, or making decisions?: No Does the patient have difficulty walking or climbing stairs?: No Does the patient have difficulty dressing or bathing?: No Does the patient have difficulty doing errands alone such as visiting a doctor's office or shopping?: No    Assessment & Plan  Problem List Items Addressed This Visit     Benign essential HTN    bp is at goal       RLS (restless legs syndrome)    Sleeping well on mirapex      Unstable angina ([place])    Doing well on medication management       Senile purpura ([place])    On arm stable and reassurance given       Primary osteoarthritis of both hands    Advised topical medication, avoid nsaid's , may take tylenol       Relevant Medications   tiZANidine (ZANAFLEX) 2 MG tablet   Primary osteoarthritis of first carpometacarpal joint of right hand    Had injection in the past      Relevant Medications   tiZANidine (ZANAFLEX) 2 MG tablet   Urge incontinence    On ditropan       Relevant Medications   oxybutynin  (DITROPAN) 5 MG tablet   Anxiety   Relevant Medications   busPIRone (BUSPAR) 5 MG tablet   Dyslipidemia associated with type 2 diabetes mellitus ([place]) - Primary    On statin therapy, A1C at goal       Relevant Orders   POCT HgB A1C (Completed)   Dyslipidemia   Chronic bilateral low back pain without sciatica   Relevant Medications   tiZANidine (ZANAFLEX) 2 MG tablet

## 2021-12-03 NOTE — Telephone Encounter (Signed)
Requested Prescriptions  Pending Prescriptions Disp Refills  . oxybutynin (DITROPAN) 5 MG tablet [Pharmacy Med Name: OXYBUTYNIN '5MG'$  TABLETS] 180 tablet 1    Sig: TAKE 1 TABLET(5 MG) BY MOUTH TWICE DAILY     Urology:  Bladder Agents Passed - 12/02/2021  9:58 AM      Passed - Valid encounter within last 12 months    Recent Outpatient Visits          1 month ago Sinusitis, unspecified chronicity, unspecified location   [facility] Delsa Grana, PA-C   3 months ago Dyslipidemia associated with type 2 diabetes mellitus [facility])   [facility] [place], Drue Stager, MD   8 months ago Dyslipidemia associated with type 2 diabetes mellitus [facility])   [facility] [place], Drue Stager, MD   1 year ago Dyslipidemia associated with type 2 diabetes mellitus [facility])   [facility] Steele Sizer, MD   1 year ago Dyslipidemia associated with type 2 diabetes mellitus [facility])   [facility] Steele Sizer, MD      Future Appointments            Tomorrow Steele Sizer, MD [facility] [place]   In 4 months  [place]

## 2021-12-04 ENCOUNTER — Ambulatory Visit (INDEPENDENT_AMBULATORY_CARE_PROVIDER_SITE_OTHER): Payer: Medicare Other | Admitting: Family Medicine

## 2021-12-04 ENCOUNTER — Encounter: Payer: Self-pay | Admitting: Family Medicine

## 2021-12-04 VITALS — BP 122/72 | HR 79 | Resp 16 | Ht 64.0 in | Wt 159.0 lb

## 2021-12-04 DIAGNOSIS — G8929 Other chronic pain: Secondary | ICD-10-CM

## 2021-12-04 DIAGNOSIS — F419 Anxiety disorder, unspecified: Secondary | ICD-10-CM | POA: Diagnosis not present

## 2021-12-04 DIAGNOSIS — E1169 Type 2 diabetes mellitus with other specified complication: Secondary | ICD-10-CM

## 2021-12-04 DIAGNOSIS — M545 Low back pain, unspecified: Secondary | ICD-10-CM

## 2021-12-04 DIAGNOSIS — M1811 Unilateral primary osteoarthritis of first carpometacarpal joint, right hand: Secondary | ICD-10-CM

## 2021-12-04 DIAGNOSIS — I10 Essential (primary) hypertension: Secondary | ICD-10-CM

## 2021-12-04 DIAGNOSIS — M19042 Primary osteoarthritis, left hand: Secondary | ICD-10-CM

## 2021-12-04 DIAGNOSIS — N3941 Urge incontinence: Secondary | ICD-10-CM

## 2021-12-04 DIAGNOSIS — I200 Unstable angina: Secondary | ICD-10-CM | POA: Diagnosis not present

## 2021-12-04 DIAGNOSIS — E785 Hyperlipidemia, unspecified: Secondary | ICD-10-CM | POA: Diagnosis not present

## 2021-12-04 DIAGNOSIS — G2581 Restless legs syndrome: Secondary | ICD-10-CM

## 2021-12-04 DIAGNOSIS — D692 Other nonthrombocytopenic purpura: Secondary | ICD-10-CM

## 2021-12-04 DIAGNOSIS — M19041 Primary osteoarthritis, right hand: Secondary | ICD-10-CM

## 2021-12-04 HISTORY — DX: Anxiety disorder, unspecified: F41.9

## 2021-12-04 HISTORY — DX: Type 2 diabetes mellitus with other specified complication: E11.69

## 2021-12-04 LAB — POCT GLYCOSYLATED HEMOGLOBIN (HGB A1C): Hemoglobin A1C: 6 % — AB (ref 4.0–5.6)

## 2021-12-04 MED ORDER — BUSPIRONE HCL 5 MG PO TABS: ORAL_TABLET | ORAL | 1 refills | Status: DC

## 2021-12-04 MED ORDER — OXYBUTYNIN CHLORIDE 5 MG PO TABS: ORAL_TABLET | ORAL | 1 refills | Status: DC

## 2021-12-04 MED ORDER — TIZANIDINE HCL 2 MG PO TABS: ORAL_TABLET | ORAL | 1 refills | Status: DC

## 2021-12-04 NOTE — Assessment & Plan Note (Signed)
Had injection in the past

## 2021-12-04 NOTE — Assessment & Plan Note (Signed)
On arm stable and reassurance given

## 2021-12-04 NOTE — Assessment & Plan Note (Signed)
On statin therapy, A1C at goal

## 2021-12-04 NOTE — Assessment & Plan Note (Signed)
Sleeping well on mirapex

## 2021-12-04 NOTE — Assessment & Plan Note (Signed)
Advised topical medication, avoid nsaid's , may take tylenol

## 2021-12-04 NOTE — Assessment & Plan Note (Signed)
Doing well on medication management

## 2021-12-04 NOTE — Assessment & Plan Note (Signed)
bp is at goal

## 2021-12-04 NOTE — Assessment & Plan Note (Signed)
On ditropan

## 2022-03-10 ENCOUNTER — Other Ambulatory Visit: Payer: Self-pay | Admitting: Family Medicine

## 2022-03-10 DIAGNOSIS — G2581 Restless legs syndrome: Secondary | ICD-10-CM

## 2022-03-22 ENCOUNTER — Encounter: Payer: Self-pay | Admitting: Family Medicine

## 2022-04-02 ENCOUNTER — Other Ambulatory Visit: Payer: Self-pay | Admitting: Family Medicine

## 2022-04-02 DIAGNOSIS — G2581 Restless legs syndrome: Secondary | ICD-10-CM

## 2022-04-02 MED ORDER — PRAMIPEXOLE DIHYDROCHLORIDE 0.5 MG PO TABS: 0.5000 mg | ORAL_TABLET | Freq: Every evening | ORAL | 1 refills | Status: DC | PRN

## 2022-04-10 ENCOUNTER — Other Ambulatory Visit: Payer: Self-pay | Admitting: Family Medicine

## 2022-04-10 DIAGNOSIS — N3941 Urge incontinence: Secondary | ICD-10-CM

## 2022-04-11 ENCOUNTER — Other Ambulatory Visit: Payer: Self-pay

## 2022-04-11 DIAGNOSIS — N3941 Urge incontinence: Secondary | ICD-10-CM

## 2022-04-14 ENCOUNTER — Other Ambulatory Visit: Payer: Self-pay | Admitting: Family Medicine

## 2022-04-14 MED ORDER — OXYBUTYNIN CHLORIDE ER 5 MG PO TB24: 5.0000 mg | ORAL_TABLET | Freq: Every day | ORAL | 0 refills | Status: DC

## 2022-04-14 NOTE — Telephone Encounter (Signed)
Called and left vm inquiring about dose

## 2022-04-24 ENCOUNTER — Ambulatory Visit: Payer: Medicare Other

## 2022-04-28 ENCOUNTER — Ambulatory Visit (INDEPENDENT_AMBULATORY_CARE_PROVIDER_SITE_OTHER): Payer: Medicare Other | Admitting: Family Medicine

## 2022-04-28 ENCOUNTER — Encounter: Payer: Self-pay | Admitting: Family Medicine

## 2022-04-28 VITALS — Ht 64.0 in | Wt 159.0 lb

## 2022-04-28 DIAGNOSIS — Z0000 Encounter for general adult medical examination without abnormal findings: Secondary | ICD-10-CM | POA: Diagnosis not present

## 2022-04-28 NOTE — Patient Instructions (Addendum)
Things to do to keep yourself healthy  - Exercise at least 30-45 minutes a day, 3-4 days a week.  - Eat a low-fat diet with lots of fruits and vegetables, up to 7-9 servings per day.  - Seatbelts can save your life. Wear them always.  - Smoke detectors on every level of your home, check batteries every year.  - Eye Doctor - have an eye exam every 1-2 years  - Safe sex - if you may be exposed to STDs, use a condom.  - Alcohol -  If you drink, do it moderately, less than 2 drinks per day.  - [place]. Choose someone to speak for you if you are not able. https://www.prepareforyourcare.org is a great website to help you navigate this. - Depression is common in our stressful world.If you're feeling down or losing interest in things you normally enjoy, please come in for a visit.  - Violence - If anyone is threatening or hurting you, please call immediately.  Check with your pharmacy about your flu and Shingles vaccines.  We will see you next week for your diabetes check up.

## 2022-04-28 NOTE — Progress Notes (Signed)
Annual Wellness Visit   Patient: Alyssa Lawson, Female    DOB: 01-Jun-1946, 76 y.o.   MRN: [id number]  Subjective  Chief Complaint  Patient presents with   Medicare Wellness    Alyssa Lawson is a 76 y.o. female who presents today for her Annual Wellness Visit. She reports consuming a general diet.  The patient does not participate in formal exercise but is very active at home, gardening, caring for aging husband.  She generally feels well. She reports sleeping fairly well. She does not have additional problems to discuss today.   HPI  Vision:Within last year and Dental: No current dental problems and Receives regular dental care   Patient Active Problem List   Diagnosis Date Noted   Primary osteoarthritis of both hands 12/04/2021   Primary osteoarthritis of first carpometacarpal joint of right hand 12/04/2021   Urge incontinence 12/04/2021   Anxiety 12/04/2021   Dyslipidemia associated with type 2 diabetes mellitus ([place]) 12/04/2021   Senile purpura ([place]) 04/05/2021   Cervical radiculitis 04/05/2021   DDD (degenerative disc disease), cervical 04/05/2021   Chronic pain of right thumb 04/05/2021   Type 2 diabetes mellitus with complication, with long-term current use of insulin ([place]) 11/09/2018   Atrial tachycardia 08/25/2017   Unstable angina ([place]) 05/01/2017   Abnormal EKG 04/24/2017   Smoking history 04/24/2017   Rotator cuff tendinitis, left 04/10/2017   Cataracts, bilateral 11/25/2016   Impingement syndrome of shoulder, left 10/13/2016   Chronic bilateral low back pain without sciatica 08/20/2016   Mitral regurgitation 06/22/2015   Melasma 12/21/2014   History of hysterectomy 12/21/2014   RLS (restless legs syndrome) 12/21/2014   Benign essential HTN 12/17/2014   Cataract 12/17/2014   Dyslipidemia 12/17/2014   Gastric reflux 12/17/2014   History of cervical cancer 12/17/2014   H/O iron deficiency anemia 12/17/2014   TI (tricuspid incompetence) 12/17/2014    Osteopenia of the elderly 12/17/2014   Social History   Tobacco Use   Smoking status: Former    Packs/day: 0.50    Years: 18.00    Total pack years: 9.00    Types: Cigarettes    Start date: 06/23/1956    Quit date: 11/22/1995    Years since quitting: 26.4   Smokeless tobacco: Former    Quit date: 11/22/1996   Tobacco comments:    None  Vaping Use   Vaping Use: Never used  Substance Use Topics   Alcohol use: Yes    Alcohol/week: 1.0 standard drink of alcohol    Types: 1 Glasses of wine per week    Comment: glass of wine occasionally maybe once a month   Drug use: No      Medications: Outpatient Medications Prior to Visit  Medication Sig   acetaminophen (TYLENOL) 500 MG tablet Take 1 tablet (500 mg total) by mouth every 6 (six) hours as needed for headache.   aspirin 81 MG chewable tablet Chew 1 tablet by mouth daily.   atorvastatin (LIPITOR) 40 MG tablet Take 1 tablet (40 mg total) by mouth daily.   busPIRone (BUSPAR) 5 MG tablet TAKE 1 TABLET(5 MG) BY MOUTH TWICE DAILY AS NEEDED   carvedilol (COREG) 25 MG tablet Take 1 tablet (25 mg total) by mouth 2 (two) times daily with a meal.   cetirizine (ZYRTEC) 10 MG tablet Take 10 mg by mouth daily.   Cholecalciferol (VITAMIN D) 50 MCG (2000 UT) CAPS Take 1 capsule by mouth daily.   cloNIDine (CATAPRES) 0.1 MG tablet Take  1 tablet (0.1 mg total) by mouth every evening.   Cyanocobalamin (VITAMIN B-12) 2000 MCG TBCR Take by mouth daily.   ferrous sulfate 325 (65 FE) MG tablet Take 325 mg by mouth daily with breakfast.   FLOWFLEX COVID-19 AG HOME TEST KIT TEST AS DIRECTED PER MANUFACTURER AND CDC GUIDANCE   hydrALAZINE (APRESOLINE) 25 MG tablet Take 1 tablet (25 mg total) by mouth 3 (three) times daily.   isosorbide mononitrate (IMDUR) 30 MG 24 hr tablet Take 1 tablet (30 mg total) by mouth daily. Please schedule office visit for further refills. Thank you!   MAGNESIUM-ZINC PO Take by mouth daily.   Multiple Vitamin (MULTIVITAMIN) capsule  Take 1 capsule by mouth daily.   olmesartan (BENICAR) 40 MG tablet TAKE 1 TABLET BY MOUTH DAILY   Olopatadine-Mometasone (RYALTRIS) 665-25 MCG/ACT SUSP Place 2 sprays into both nostrils daily.   oxybutynin (DITROPAN-XL) 5 MG 24 hr tablet Take 1 tablet (5 mg total) by mouth at bedtime. Same dose but now it is sustained released. Once a day dose   pramipexole (MIRAPEX) 0.5 MG tablet Take 1 tablet (0.5 mg total) by mouth at bedtime as needed. In place of Requip   tiZANidine (ZANAFLEX) 2 MG tablet TAKE 1 TABLET BY MOUTH  EVERY 8 HOURS AS NEEDED FOR MUSCLE SPASM(S)   valACYclovir (VALTREX) 500 MG tablet TAKE 1 TABLET BY MOUTH 2  TIMES DAILY. PER EPISODE AS NEEDED, TAKE FOR 10 DAYS   vitamin C (ASCORBIC ACID) 500 MG tablet Take 500 mg by mouth daily.   No facility-administered medications prior to visit.    Allergies  Allergen Reactions   Amlodipine Swelling   Meloxicam Palpitations    Patient Care Team: Steele Sizer, MD as PCP - General (Family Medicine) [name] Kathlene November, MD as Consulting Physician (Cardiology) Reche Dixon, PA-C (Orthopedic Surgery)  ROS      Objective  Ht _0  (1.626 m) Comment: per chart  Wt 159 lb (72.1 kg) Comment: per chart  BMI 27.29 kg/m  BP Readings from Last 3 Encounters:  12/04/21 122/72  10/23/21 120/68  09/13/21 130/78   Wt Readings from Last 3 Encounters:  04/28/22 159 lb (72.1 kg)  12/04/21 159 lb (72.1 kg)  10/23/21 159 lb (72.1 kg)     Physical Exam    Most recent functional status assessment:    04/28/2022   10:25 AM  In your present state of health, do you have any difficulty performing the following activities:  Hearing? 0  Vision? 0  Difficulty concentrating or making decisions? 0  Walking or climbing stairs? 0  Dressing or bathing? 0  Doing errands, shopping? 0   Most recent fall risk assessment:    04/28/2022   10:25 AM  Fall Risk   Falls in the past year? 0  Risk for fall due to : No Fall Risks  Follow up Falls  prevention discussed;Education provided    Most recent depression screenings:    04/28/2022   10:25 AM 12/04/2021    2:01 PM  PHQ 2/9 Scores  PHQ - 2 Score 0 0  PHQ- 9 Score 0 0   Most recent cognitive screening:    04/28/2022   10:24 AM  6CIT Screen  What Year? 0 points  What month? 0 points  What time? 0 points  Count back from 20 0 points  Months in reverse 0 points  Repeat phrase 0 points  Total Score 0 points   Most recent Audit-C alcohol use screening  04/28/2022   10:25 AM  Alcohol Use Disorder Test (AUDIT)  1. How often do you have a drink containing alcohol? 0   A score of 3 or more in women, and 4 or more in men indicates increased risk for alcohol abuse, EXCEPT if all of the points are from question 1   Vision/Hearing Screen: No results found.    No results found for any visits on 04/28/22.    Assessment & Plan   Annual wellness visit done today including the all of the following: Reviewed patient's Family Medical History Reviewed and updated list of patient's medical providers Assessment of cognitive impairment was done Assessed patient's functional ability Established a written schedule for health screening Short Completed and Reviewed  Exercise Activities and Dietary recommendations  Goals      DIET - REDUCE SUGAR INTAKE     Recommend to eliminate sugar intake and to eat 3 small healthy meals and at least 2 healthy snacks per day.     Increase physical activity     Recommend increasing physical activity and continuing to garden        Immunization History  Administered Date(s) Administered   Fluad Quad(high Dose 65+) 05/10/2019, 03/30/2020, 04/05/2021   Influenza Split 04/27/2007, 04/19/2009   Influenza, High Dose Seasonal PF 06/22/2015, 06/06/2016, 02/25/2017, 04/21/2018   Influenza,inj,Quad PF,6+ Mos 03/15/2013, 06/19/2014   Moderna Sars-Covid-2 Vaccination 06/23/2019, 07/22/2019, 02/11/2020, 09/30/2020,  03/02/2021   Pneumococcal Conjugate-13 12/21/2014   Pneumococcal Polysaccharide-23 04/27/2007, 09/14/2012   Td 11/02/2007   Tdap 01/20/2018   Zoster, Live 07/12/2013    Health Maintenance  Topic Date Due   Diabetic kidney evaluation - GFR measurement  04/05/2022   Diabetic kidney evaluation - Urine ACR  04/05/2022   FOOT EXAM  04/05/2022   COVID-19 Vaccine (6 - Moderna risk series) 05/14/2022 (Originally 04/27/2021)   Zoster Vaccines- Shingrix (1 of 2) 07/29/2022 (Originally 02/21/1965)   INFLUENZA VACCINE  09/21/2022 (Originally 01/21/2022)   HEMOGLOBIN A1C  06/05/2022   OPHTHALMOLOGY EXAM  09/04/2022   MAMMOGRAM  11/05/2022   Medicare Annual Wellness (AWV)  04/29/2023   TETANUS/TDAP  01/21/2028   Pneumonia Vaccine 56+ Years old  Completed   DEXA SCAN  Completed   Hepatitis C Screening  Completed   HPV VACCINES  Aged Out   COLONOSCOPY (Pts 45-25yr Insurance coverage will need to be confirmed)  Discontinued    Advanced Care Planning: A voluntary discussion about advance care planning including the explanation and discussion of advance directives.  Discussed health care proxy and Living will, and the patient was able to identify a health care proxy as son, CUrijah Raynor  Patient does not have a living will at present time. If patient does have living will, I have requested they bring this to the clinic to be scanned in to their chart.  Discussed health benefits of physical activity, and encouraged her to engage in regular exercise appropriate for her age and condition.    Problem List Items Addressed This Visit   None Visit Diagnoses     Medicare annual wellness visit, subsequent    -  Primary       Return in about 1 year (around 04/29/2023).     AMyles Gip DO

## 2022-05-06 NOTE — Progress Notes (Unsigned)
Name: Alyssa Lawson   MRN: [id number]    DOB: 04-11-1946   Date:05/07/2022       Progress Note  Subjective  Chief Complaint  Follow Up  HPI  HTN: taking coreg '25mg'$  daily, Benicar 40 , hydralazine 25 mg TID ( down from 50 QID )  she is not sure if he is taking Clonidine at night.  BP is controlled.  BP is at goal She denies headaches of palpitation   Hyperlipidemia: last lipid panel was at goal, LDL was 54 . She is taking atorvastatin and denies muscle aches.   DMII: diagnosed with diabetes years ago, since 2015  She stopped taking metformin due to pt c/o swelling and weight gain with medication. Pt was started Trulicity back in 22/0254 but stopped because of cost. She is on diet only at this time, A1C went from to 6 % and now is  6.2 %    Urinary incontinence: she has been on Ditropan , tolerating medication well, nocturia has resolved with medication   Senile purpura: stable and reassurance given Stable    RLS: better with medication, she was on Requip and we switched to Mirapex and she states she has been able to sleep well    Chronic Back Pain: she has daily low back pain, She takes Tylenol and Celebrex daily, very seldom takes Tizanidine now    Unstable angina: indigestion symptoms with abnormal EKG, normal cardiac cath done Nov 2018 by Dr. [name], on higher dose of Coreg, Benicar , statin and Imdur  therefore symptoms controlled with medication management She also takes an aspirin daily   Atrial tachycardia: no recent episodes of palpitation , she is on beta blocker and is rate controlled .Unchanged  Stress/Anxiety : she states her husband has dementia, is paranoid, blames her for everything , he is now voiding all over the house, she cannot afford a long term care facility, she is paying for a lady to come to her house 3 days a week , she baths him, gives him breakfast and lunch and is working well.   Cervical radiculitis: she went to PT at [facility], currently only taking  tizanidine prn she states tingling now only on right thumb  She states gabapentin did not work, we gave her lyrica but she it no longer taking it and symptoms have improved   Hand pain: both hands, proximal interphalangeal joints getting swollen int he evening - at the end of the day-  and stiff but not for a long time, right hand is worse than left . She is seeing Ortho at [facility] and will have surgery on right hand done by Dr. Roland Rack after the holidays, she is now taking Celebrex 100 mg daily and states pain on hands, back, neck have improved significantly   Patient Active Problem List   Diagnosis Date Noted   Primary osteoarthritis of both hands 12/04/2021   Primary osteoarthritis of first carpometacarpal joint of right hand 12/04/2021   Urge incontinence 12/04/2021   Anxiety 12/04/2021   Dyslipidemia associated with type 2 diabetes mellitus ([place]) 12/04/2021   Senile purpura ([place]) 04/05/2021   Cervical radiculitis 04/05/2021   DDD (degenerative disc disease), cervical 04/05/2021   Chronic pain of right thumb 04/05/2021   Type 2 diabetes mellitus with complication, with long-term current use of insulin ([place]) 11/09/2018   Atrial tachycardia 08/25/2017   Unstable angina ([place]) 05/01/2017   Abnormal EKG 04/24/2017   Smoking history 04/24/2017   Rotator cuff tendinitis, left 04/10/2017  Cataracts, bilateral 11/25/2016   Impingement syndrome of shoulder, left 10/13/2016   Chronic bilateral low back pain without sciatica 08/20/2016   Mitral regurgitation 06/22/2015   Melasma 12/21/2014   History of hysterectomy 12/21/2014   RLS (restless legs syndrome) 12/21/2014   Benign essential HTN 12/17/2014   Cataract 12/17/2014   Dyslipidemia 12/17/2014   Gastric reflux 12/17/2014   History of cervical cancer 12/17/2014   H/O iron deficiency anemia 12/17/2014   TI (tricuspid incompetence) 12/17/2014   Osteopenia of the elderly 12/17/2014    Past Surgical History:  Procedure Laterality  Date   ABDOMINAL HYSTERECTOMY  1976   CARDIAC CATHETERIZATION     LEFT HEART CATH AND CORONARY ANGIOGRAPHY N/A 05/01/2017   Procedure: LEFT HEART CATH AND CORONARY ANGIOGRAPHY;  Surgeon: Minna Merritts, MD;  Location: [facility];  Service: Cardiovascular;  Laterality: N/A;   OOPHORECTOMY      Family History  Problem Relation Age of Onset   Heart failure Father    Arrhythmia Father    Hypertension Father    Arrhythmia Sister    Hypertension Sister    Congestive Heart Failure Brother    Hypertension Brother    Cardiomyopathy Sister    Diabetes Brother    Breast cancer Paternal Aunt 60    Social History   Tobacco Use   Smoking status: Former    Packs/day: 0.50    Years: 18.00    Total pack years: 9.00    Types: Cigarettes    Start date: 06/23/1956    Quit date: 11/22/1995    Years since quitting: 26.4   Smokeless tobacco: Former    Quit date: 11/22/1996   Tobacco comments:    None  Substance Use Topics   Alcohol use: Yes    Alcohol/week: 1.0 standard drink of alcohol    Types: 1 Glasses of wine per week    Comment: glass of wine occasionally maybe once a month     Current Outpatient Medications:    acetaminophen (TYLENOL) 500 MG tablet, Take 1 tablet (500 mg total) by mouth every 6 (six) hours as needed for headache., Disp: 30 tablet, Rfl: 0   aspirin 81 MG chewable tablet, Chew 1 tablet by mouth daily., Disp: , Rfl:    atorvastatin (LIPITOR) 40 MG tablet, Take 1 tablet (40 mg total) by mouth daily., Disp: 90 tablet, Rfl: 3   carvedilol (COREG) 25 MG tablet, Take 1 tablet (25 mg total) by mouth 2 (two) times daily with a meal., Disp: 180 tablet, Rfl: 3   celecoxib (CELEBREX) 100 MG capsule, Take 100 mg by mouth daily., Disp: , Rfl:    cetirizine (ZYRTEC) 10 MG tablet, Take 10 mg by mouth daily., Disp: , Rfl:    Cholecalciferol (VITAMIN D) 50 MCG (2000 UT) CAPS, Take 1 capsule by mouth daily., Disp: , Rfl:    cloNIDine (CATAPRES) 0.1 MG tablet, Take 1 tablet  (0.1 mg total) by mouth every evening., Disp: 90 tablet, Rfl: 3   Cyanocobalamin (VITAMIN B-12) 2000 MCG TBCR, Take by mouth daily., Disp: , Rfl:    ferrous sulfate 325 (65 FE) MG tablet, Take 325 mg by mouth daily with breakfast., Disp: , Rfl:    hydrALAZINE (APRESOLINE) 25 MG tablet, Take 1 tablet (25 mg total) by mouth 3 (three) times daily., Disp: 270 tablet, Rfl: 3   isosorbide mononitrate (IMDUR) 30 MG 24 hr tablet, Take 1 tablet (30 mg total) by mouth daily. Please schedule office visit for further refills. Thank  you!, Disp: 90 tablet, Rfl: 3   MAGNESIUM-ZINC PO, Take by mouth daily., Disp: , Rfl:    Multiple Vitamin (MULTIVITAMIN) capsule, Take 1 capsule by mouth daily., Disp: , Rfl:    olmesartan (BENICAR) 40 MG tablet, TAKE 1 TABLET BY MOUTH DAILY, Disp: 90 tablet, Rfl: 3   Olopatadine-Mometasone (RYALTRIS) 665-25 MCG/ACT SUSP, Place 2 sprays into both nostrils daily., Disp: 9 g, Rfl: 5   pramipexole (MIRAPEX) 0.5 MG tablet, Take 1 tablet (0.5 mg total) by mouth at bedtime as needed. In place of Requip, Disp: 90 tablet, Rfl: 1   tiZANidine (ZANAFLEX) 2 MG tablet, TAKE 1 TABLET BY MOUTH  EVERY 8 HOURS AS NEEDED FOR MUSCLE SPASM(S), Disp: 90 tablet, Rfl: 1   valACYclovir (VALTREX) 500 MG tablet, TAKE 1 TABLET BY MOUTH 2  TIMES DAILY. PER EPISODE AS NEEDED, TAKE FOR 10 DAYS, Disp: 90 tablet, Rfl: 0   vitamin C (ASCORBIC ACID) 500 MG tablet, Take 500 mg by mouth daily., Disp: , Rfl:    busPIRone (BUSPAR) 5 MG tablet, TAKE 1 TABLET(5 MG) BY MOUTH TWICE DAILY AS NEEDED, Disp: 180 tablet, Rfl: 1   oxybutynin (DITROPAN-XL) 5 MG 24 hr tablet, Take 1 tablet (5 mg total) by mouth at bedtime. Same dose but now it is sustained released. Once a day dose, Disp: 90 tablet, Rfl: 1  Allergies  Allergen Reactions   Amlodipine Swelling   Meloxicam Palpitations    I personally reviewed active problem list, medication list, allergies, family history, social history, health maintenance with the  patient/caregiver today.   ROS  Constitutional: Negative for fever or weight change.  Respiratory: Negative for cough and shortness of breath.   Cardiovascular: Negative for chest pain or palpitations.  Gastrointestinal: Negative for abdominal pain, no bowel changes.  Musculoskeletal: Negative for gait problem or joint swelling.  Skin: Negative for rash.  Neurological: Negative for dizziness or headache.  No other specific complaints in a complete review of systems (except as listed in HPI above).   Objective  Vitals:   05/07/22 1446  BP: 132/78  Pulse: 73  Resp: 14  Temp: 97.8 F (36.6 C)  TempSrc: Oral  SpO2: 96%  Weight: 154 lb 9.6 oz (70.1 kg)  Height: '5\' 4"'$  (1.626 m)    Body mass index is 26.54 kg/m.  Physical Exam  Constitutional: Patient appears well-developed and well-nourished. Obese  No distress.  HEENT: head atraumatic, normocephalic, pupils equal and reactive to light, neck supple Cardiovascular: Normal rate, regular rhythm and normal heart sounds.  No murmur heard. No BLE edema. Pulmonary/Chest: Effort normal and breath sounds normal. No respiratory distress. Abdominal: Soft.  There is no tenderness. Psychiatric: Patient has a normal mood and affect. behavior is normal. Judgment and thought content normal.   PHQ2/9:    05/07/2022    2:49 PM 04/28/2022   10:25 AM 12/04/2021    2:01 PM 10/23/2021    3:19 PM 08/07/2021    1:38 PM  Depression screen PHQ 2/9  Decreased Interest 0 0 0 0 0  Down, Depressed, Hopeless 0 0 0 0 0  PHQ - 2 Score 0 0 0 0 0  Altered sleeping 0 0 0 0 0  Tired, decreased energy 0 0 0 3 0  Change in appetite 0 0 0 3 0  Feeling bad or failure about yourself  0 0 0 0 0  Trouble concentrating 0 0 0 0 0  Moving slowly or fidgety/restless 0 0 0 0 0  Suicidal thoughts 0  0 0 0 0  PHQ-9 Score 0 0 0 6 0    phq 9 is negative   Fall Risk:    05/07/2022    2:49 PM 04/28/2022   10:25 AM 12/04/2021    2:01 PM 10/23/2021    3:19 PM  08/07/2021    1:37 PM  Fall Risk   Falls in the past year? 0 0 0 0 0  Number falls in past yr:   0 0 0  Injury with Fall?   0 0 0  Risk for fall due to : No Fall Risks No Fall Risks No Fall Risks No Fall Risks No Fall Risks  Follow up Falls prevention discussed;Education provided;Falls evaluation completed Falls prevention discussed;Education provided Falls prevention discussed Falls prevention discussed Falls prevention discussed    Assessment & Plan  1. Dyslipidemia associated with type 2 diabetes mellitus (HCC)  - POCT HgB A1C - Urine Microalbumin w/creat. ratio - COMPLETE METABOLIC PANEL WITH GFR - HM Diabetes Foot Exam - Lipid panel  2. Need for immunization against influenza  - Flu Vaccine QUAD High Dose(Fluad)  3. Senile purpura ([place])  Reassurance given   4. RLS (restless legs syndrome)  Doing well on Mirapex   5. Chronic bilateral low back pain without sciatica   6. DDD (degenerative disc disease), cervical  Stable   7. Unstable angina (HCC)  Doing well , up to date with visits with cardiologist   8. Primary osteoarthritis of both hands  Doing better on celebrex  9. Long-term use of high-risk medication  - COMPLETE METABOLIC PANEL WITH GFR - CBC with Differential/Platelet  10. Anxiety  - busPIRone (BUSPAR) 5 MG tablet; TAKE 1 TABLET(5 MG) BY MOUTH TWICE DAILY AS NEEDED  Dispense: 180 tablet; Refill: 1

## 2022-05-07 ENCOUNTER — Encounter: Payer: Self-pay | Admitting: Family Medicine

## 2022-05-07 ENCOUNTER — Ambulatory Visit (INDEPENDENT_AMBULATORY_CARE_PROVIDER_SITE_OTHER): Payer: Medicare Other | Admitting: Family Medicine

## 2022-05-07 VITALS — BP 132/78 | HR 73 | Temp 97.8°F | Resp 14 | Ht 64.0 in | Wt 154.6 lb

## 2022-05-07 DIAGNOSIS — E785 Hyperlipidemia, unspecified: Secondary | ICD-10-CM

## 2022-05-07 DIAGNOSIS — Z23 Encounter for immunization: Secondary | ICD-10-CM

## 2022-05-07 DIAGNOSIS — G8929 Other chronic pain: Secondary | ICD-10-CM

## 2022-05-07 DIAGNOSIS — I200 Unstable angina: Secondary | ICD-10-CM

## 2022-05-07 DIAGNOSIS — D692 Other nonthrombocytopenic purpura: Secondary | ICD-10-CM

## 2022-05-07 DIAGNOSIS — M545 Low back pain, unspecified: Secondary | ICD-10-CM

## 2022-05-07 DIAGNOSIS — F419 Anxiety disorder, unspecified: Secondary | ICD-10-CM

## 2022-05-07 DIAGNOSIS — G2581 Restless legs syndrome: Secondary | ICD-10-CM | POA: Diagnosis not present

## 2022-05-07 DIAGNOSIS — M5030 Other cervical disc degeneration, unspecified cervical region: Secondary | ICD-10-CM

## 2022-05-07 DIAGNOSIS — M19041 Primary osteoarthritis, right hand: Secondary | ICD-10-CM

## 2022-05-07 DIAGNOSIS — M19042 Primary osteoarthritis, left hand: Secondary | ICD-10-CM

## 2022-05-07 DIAGNOSIS — Z79899 Other long term (current) drug therapy: Secondary | ICD-10-CM

## 2022-05-07 DIAGNOSIS — E1169 Type 2 diabetes mellitus with other specified complication: Secondary | ICD-10-CM

## 2022-05-07 LAB — POCT GLYCOSYLATED HEMOGLOBIN (HGB A1C): Hemoglobin A1C: 6.2 % — AB (ref 4.0–5.6)

## 2022-05-07 MED ORDER — BUSPIRONE HCL 5 MG PO TABS: ORAL_TABLET | ORAL | 1 refills | Status: DC

## 2022-05-07 MED ORDER — OXYBUTYNIN CHLORIDE ER 5 MG PO TB24: 5.0000 mg | ORAL_TABLET | Freq: Every day | ORAL | 1 refills | Status: DC

## 2022-05-08 LAB — COMPLETE METABOLIC PANEL WITH GFR
AG Ratio: 1.8 (calc) (ref 1.0–2.5)
ALT: 21 U/L (ref 6–29)
AST: 22 U/L (ref 10–35)
Albumin: 4.1 g/dL (ref 3.6–5.1)
Alkaline phosphatase (APISO): 52 U/L (ref 37–153)
BUN: 15 mg/dL (ref 7–25)
CO2: 32 mmol/L (ref 20–32)
Calcium: 9.7 mg/dL (ref 8.6–10.4)
Chloride: 104 mmol/L (ref 98–110)
Creat: 0.89 mg/dL (ref 0.60–1.00)
Globulin: 2.3 g/dL (calc) (ref 1.9–3.7)
Glucose, Bld: 95 mg/dL (ref 65–99)
Potassium: 4 mmol/L (ref 3.5–5.3)
Sodium: 142 mmol/L (ref 135–146)
Total Bilirubin: 0.7 mg/dL (ref 0.2–1.2)
Total Protein: 6.4 g/dL (ref 6.1–8.1)
eGFR: 67 mL/min/{1.73_m2} (ref >=60)

## 2022-05-08 LAB — CBC WITH DIFFERENTIAL/PLATELET
Absolute Monocytes: 456 cells/uL (ref 200–950)
Basophils Absolute: 51 cells/uL (ref 0–200)
Basophils Relative: 0.9 %
Eosinophils Absolute: 211 cells/uL (ref 15–500)
Eosinophils Relative: 3.7 %
HCT: 39.5 % (ref 35.0–45.0)
Hemoglobin: 13.5 g/dL (ref 11.7–15.5)
Lymphs Abs: 1767 cells/uL (ref 850–3900)
MCH: 31.5 pg (ref 27.0–33.0)
MCHC: 34.2 g/dL (ref 32.0–36.0)
MCV: 92.3 fL (ref 80.0–100.0)
MPV: 12.2 fL (ref 7.5–12.5)
Monocytes Relative: 8 %
Neutro Abs: 3215 cells/uL (ref 1500–7800)
Neutrophils Relative %: 56.4 %
Platelets: 184 10*3/uL (ref 140–400)
RBC: 4.28 10*6/uL (ref 3.80–5.10)
RDW: 12 % (ref 11.0–15.0)
Total Lymphocyte: 31 %
WBC: 5.7 10*3/uL (ref 3.8–10.8)

## 2022-05-08 LAB — MICROALBUMIN / CREATININE URINE RATIO
Creatinine, Urine: 51 mg/dL (ref 20–275)
Microalb Creat Ratio: 14 mcg/mg creat (ref <30)
Microalb, Ur: 0.7 mg/dL

## 2022-05-08 LAB — LIPID PANEL
Cholesterol: 132 mg/dL (ref <200)
HDL: 65 mg/dL (ref >=50)
LDL Cholesterol (Calc): 54 mg/dL (calc)
Non-HDL Cholesterol (Calc): 67 mg/dL (calc) (ref <130)
Total CHOL/HDL Ratio: 2 (calc) (ref <5.0)
Triglycerides: 53 mg/dL (ref <150)

## 2022-05-23 ENCOUNTER — Other Ambulatory Visit: Payer: Self-pay | Admitting: Cardiovascular Disease

## 2022-05-23 DIAGNOSIS — I10 Essential (primary) hypertension: Secondary | ICD-10-CM

## 2022-06-10 ENCOUNTER — Other Ambulatory Visit: Payer: Self-pay | Admitting: Family Medicine

## 2022-06-10 DIAGNOSIS — G2581 Restless legs syndrome: Secondary | ICD-10-CM

## 2022-07-02 ENCOUNTER — Other Ambulatory Visit: Payer: Self-pay | Admitting: Surgery

## 2022-07-14 DIAGNOSIS — M25542 Pain in joints of left hand: Secondary | ICD-10-CM | POA: Diagnosis not present

## 2022-07-14 DIAGNOSIS — E118 Type 2 diabetes mellitus with unspecified complications: Secondary | ICD-10-CM | POA: Diagnosis not present

## 2022-07-14 DIAGNOSIS — Z794 Long term (current) use of insulin: Secondary | ICD-10-CM | POA: Diagnosis not present

## 2022-07-14 DIAGNOSIS — M1811 Unilateral primary osteoarthritis of first carpometacarpal joint, right hand: Secondary | ICD-10-CM | POA: Diagnosis not present

## 2022-07-15 ENCOUNTER — Other Ambulatory Visit: Payer: Self-pay | Admitting: Cardiovascular Disease

## 2022-07-15 ENCOUNTER — Encounter: Payer: Self-pay | Admitting: Family Medicine

## 2022-07-15 DIAGNOSIS — I10 Essential (primary) hypertension: Secondary | ICD-10-CM

## 2022-07-16 ENCOUNTER — Encounter
Admission: RE | Admit: 2022-07-16 | Discharge: 2022-07-16 | Disposition: A | Discharge status: Home / Self Care | Payer: Medicare Other | Source: Ambulatory Visit | Attending: Surgery | Admitting: Surgery

## 2022-07-16 VITALS — Ht 64.0 in | Wt 154.3 lb

## 2022-07-16 DIAGNOSIS — I200 Unstable angina: Secondary | ICD-10-CM

## 2022-07-16 DIAGNOSIS — I4719 Other supraventricular tachycardia: Secondary | ICD-10-CM

## 2022-07-16 DIAGNOSIS — I10 Essential (primary) hypertension: Secondary | ICD-10-CM

## 2022-07-16 DIAGNOSIS — R9431 Abnormal electrocardiogram [ECG] [EKG]: Secondary | ICD-10-CM

## 2022-07-16 DIAGNOSIS — Z01810 Encounter for preprocedural cardiovascular examination: Secondary | ICD-10-CM

## 2022-07-16 DIAGNOSIS — E118 Type 2 diabetes mellitus with unspecified complications: Secondary | ICD-10-CM

## 2022-07-16 HISTORY — DX: Dependent relative needing care at home: Z63.6

## 2022-07-16 HISTORY — DX: Other supraventricular tachycardia: I47.19

## 2022-07-16 HISTORY — DX: Prediabetes: R73.03

## 2022-07-16 HISTORY — DX: Unstable angina: I20.0

## 2022-07-16 HISTORY — DX: Rheumatic tricuspid insufficiency: I07.1

## 2022-07-16 NOTE — Patient Instructions (Signed)
Your procedure is scheduled on:07-23-22 Wednesday Report to the Registration Desk on the 1st floor of the [facility].Then proceed to the 2nd floor Surgery Desk To find out your arrival time, please call [phone] between 1PM - 3PM on:07-22-22 Tuesday If your arrival time is 6:00 am, do not arrive prior to that time as the [facility] entrance doors do not open until 6:00 am.  REMEMBER: Instructions that are not followed completely may result in serious medical risk, up to and including death; or upon the discretion of your surgeon and anesthesiologist your surgery may need to be rescheduled.  Do not eat food after midnight the night before surgery.  No gum chewing, lozengers or hard candies.  You may however, drink CLEAR liquids up to 2 hours before you are scheduled to arrive for your surgery. Do not drink anything within 2 hours of your scheduled arrival time.  Clear liquids include: - water  - apple juice without pulp - gatorade (not RED colors) - black coffee or tea (Do NOT add milk or creamers to the coffee or tea) Do NOT drink anything that is not on this list.  In addition, your doctor has ordered for you to drink the provided  Ensure Pre-Surgery Clear Carbohydrate Drink  Drinking this carbohydrate drink up to two hours before surgery helps to reduce insulin resistance and improve patient outcomes. Please complete drinking 2 hours prior to scheduled arrival time.  TAKE THESE MEDICATIONS THE MORNING OF SURGERY WITH A SIP OF WATER: -carvedilol (COREG)  -cloNIDine (CATAPRES)  -hydrALAZINE (APRESOLINE)  -omeprazole (PRILOSEC OTC) -take one the night before and one on the morning of surgery - helps to prevent nausea after surgery.)  Continue your 81 mg Aspirin up until the day prior to surgery-Do NOT take the day of surgery  One week prior to surgery: Stop Anti-inflammatories (NSAIDS) such as Advil, Aleve, Ibuprofen, Motrin, Naproxen, Naprosyn and Aspirin based products such  as Excedrin, Goodys Powder, BC Powder.You may however, continue to take Tylenol if needed for pain up until the day of surgery. You can continue your celecoxib (CELEBREX) up until the day prior to surgery  [place] supplements/vitamins NOW (07-16-22) until after surgery (vitamin D, B12, C,ferrous sulfate, magnesium-zinc, multivitamin)  No Alcohol for 24 hours before or after surgery.  No Smoking including e-cigarettes for 24 hours prior to surgery.  No chewable tobacco products for at least 6 hours prior to surgery.  No nicotine patches on the day of surgery.  Do not use any "recreational" drugs for at least a week prior to your surgery.  Please be advised that the combination of cocaine and anesthesia may have negative outcomes, up to and including death. If you test positive for cocaine, your surgery will be cancelled.  On the morning of surgery brush your teeth with toothpaste and water, you may rinse your mouth with mouthwash if you wish. Do not swallow any toothpaste or mouthwash.  Use CHG Soap as directed on instruction sheet.  Do not wear jewelry, make-up, hairpins, clips or nail polish.  Do not wear lotions, powders, or perfumes.   Do not shave body from the neck down 48 hours prior to surgery just in case you cut yourself which could leave a site for infection.  Also, freshly shaved skin may become irritated if using the CHG soap.  Contact lenses, hearing aids and dentures may not be worn into surgery.  Do not bring valuables to the hospital. [facility] is not  responsible for any missing/lost belongings or valuables.   Notify your doctor if there is any change in your medical condition (cold, fever, infection).  Wear comfortable clothing (specific to your surgery type) to the hospital.  After surgery, you can help prevent lung complications by doing breathing exercises.  Take deep breaths and cough every 1-2 hours. Your doctor may order a device called an  Incentive Spirometer to help you take deep breaths. When coughing or sneezing, hold a pillow firmly against your incision with both hands. This is called "splinting." Doing this helps protect your incision. It also decreases belly discomfort.  If you are being admitted to the hospital overnight, leave your suitcase in the car. After surgery it may be brought to your room.  If you are being discharged the day of surgery, you will not be allowed to drive home. You will need a responsible adult (18 years or older) to drive you home and stay with you that night.   If you are taking public transportation, you will need to have a responsible adult (18 years or older) with you. Please confirm with your physician that it is acceptable to use public transportation.   Please call the [facility] at [phone] if you have any questions about these instructions.  Surgery Visitation Policy:  Patients undergoing a surgery or procedure may have two family members or support persons with them as long as the person is not COVID-19 positive or experiencing its symptoms.   Due to an increase in RSV and influenza rates and associated hospitalizations, children ages 61 and under will not be able to visit patients in [facility]. Masks continue to be strongly recommended.   How to Use an Incentive Spirometer An incentive spirometer is a tool that measures how well you are filling your lungs with each breath. Learning to take long, deep breaths using this tool can help you keep your lungs clear and active. This may help to reverse or lessen your chance of developing breathing (pulmonary) problems, especially infection. You may be asked to use a spirometer: After a surgery. If you have a lung problem or a history of smoking. After a long period of time when you have been unable to move or be active. If the spirometer includes an indicator to show the highest number that you have  reached, your health care provider or respiratory therapist will help you set a goal. Keep a log of your progress as told by your health care provider. What are the risks? Breathing too quickly may cause dizziness or cause you to pass out. Take your time so you do not get dizzy or light-headed. If you are in pain, you may need to take pain medicine before doing incentive spirometry. It is harder to take a deep breath if you are having pain. How to use your incentive spirometer  Sit up on the edge of your bed or on a chair. Hold the incentive spirometer so that it is in an upright position. Before you use the spirometer, breathe out normally. Place the mouthpiece in your mouth. Make sure your lips are closed tightly around it. Breathe in slowly and as deeply as you can through your mouth, causing the piston or the ball to rise toward the top of the chamber. Hold your breath for 3-5 seconds, or for as long as possible. If the spirometer includes a coach indicator, use this to guide you in breathing. Slow down your breathing if the indicator  goes above the marked areas. Remove the mouthpiece from your mouth and breathe out normally. The piston or ball will return to the bottom of the chamber. Rest for a few seconds, then repeat the steps 10 or more times. Take your time and take a few normal breaths between deep breaths so that you do not get dizzy or light-headed. Do this every 1-2 hours when you are awake. If the spirometer includes a goal marker to show the highest number you have reached (best effort), use this as a goal to work toward during each repetition. After each set of 10 deep breaths, cough a few times. This will help to make sure that your lungs are clear. If you have an incision on your chest or abdomen from surgery, place a pillow or a rolled-up towel firmly against the incision when you cough. This can help to reduce pain while taking deep breaths and coughing. General tips When  you are able to get out of bed: Walk around often. Continue to take deep breaths and cough in order to clear your lungs. Keep using the incentive spirometer until your health care provider says it is okay to stop using it. If you have been in the hospital, you may be told to keep using the spirometer at home. Contact a health care provider if: You are having difficulty using the spirometer. You have trouble using the spirometer as often as instructed. Your pain medicine is not giving enough relief for you to use the spirometer as told. You have a fever. Get help right away if: You develop shortness of breath. You develop a cough with bloody mucus from the lungs. You have fluid or blood coming from an incision site after you cough. Summary An incentive spirometer is a tool that can help you learn to take long, deep breaths to keep your lungs clear and active. You may be asked to use a spirometer after a surgery, if you have a lung problem or a history of smoking, or if you have been inactive for a long period of time. Use your incentive spirometer as instructed every 1-2 hours while you are awake. If you have an incision on your chest or abdomen, place a pillow or a rolled-up towel firmly against your incision when you cough. This will help to reduce pain. Get help right away if you have shortness of breath, you cough up bloody mucus, or blood comes from your incision when you cough. This information is not intended to replace advice given to you by your health care provider. Make sure you discuss any questions you have with your health care provider. Document Revised: 08/29/2019 Document Reviewed: 08/29/2019 Elsevier Patient Education  [place].

## 2022-07-17 ENCOUNTER — Telehealth: Payer: Self-pay | Admitting: *Deleted

## 2022-07-17 ENCOUNTER — Encounter: Payer: Self-pay | Admitting: Surgery

## 2022-07-17 NOTE — Telephone Encounter (Signed)
-----  Message from Karen Kitchens, NP sent at 07/17/2022  9:21 AM EST ----- Regarding: Request for pre-operative cardiac clearance Request for pre-operative cardiac clearance:  1. What type of surgery is being performed?  RIGHT THUMB [facility] SUSPENSION ARTHROPLASTY  2. When is this surgery scheduled?  07/23/2022  3. Type of clearance being requested (medical, pharmacy, both)? MEDICAL   4. Are there any medications that need to be held prior to surgery? ASA  5. Practice name and name of physician performing surgery?  Performing surgeon: Dr. Milagros Evener, MD Requesting clearance: Honor Loh, FNP-C    6. Anesthesia type (none, local, MAC, general)? GENERAL  7. What is the office phone and fax number?   Phone: [phone] Fax: [phone]  ATTENTION: Unable to create telephone message as per your standard workflow. Directed by [facility] providers to send requests for cardiac clearance to this pool for appropriate distribution to provider covering pre-operative clearances.   Honor Loh, MSN, APRN, FNP-C, CEN [facility]  Peri-operative Services Nurse Practitioner Phone: [phone] 07/17/22 9:21 AM

## 2022-07-17 NOTE — Telephone Encounter (Signed)
Late entry: Danielle G. CMA s/w the pt and scheduled tele pre op appt 07/21/22. Med rec and consent done.

## 2022-07-17 NOTE — Progress Notes (Signed)
Perioperative Services  Pre-Admission/Anesthesia Testing Clinical Review  Date: 07/21/22  Patient Demographics:  Name: Alyssa Lawson DOB:   02-02-1946 MRN:   [id number]  Planned Surgical Procedure(s):    Case: [id number] Date/Time: 07/23/22 1015   Procedure: RIGHT THUMB [facility] SUSPENSION ARTHROPLASTY (Right: Thumb)   Anesthesia type: Choice   Pre-op diagnosis:      Finger pain, right M79.644     Primary osteoarthritis of first carpometacarpal joint of right hand M18.11   Location: [facility] OR ROOM 02 / [place] [facility]   Surgeons: Corky Mull, MD   NOTE: Available PAT nursing documentation and vital signs have been reviewed. Clinical nursing staff has updated patient's PMH/PSHx, current medication list, and drug allergies/intolerances to ensure comprehensive history available to assist in medical decision making as it pertains to the aforementioned surgical procedure and anticipated anesthetic course. Extensive review of available clinical information performed. [place] PMH and PSHx updated with any diagnoses/procedures that  may have been inadvertently omitted during her intake with the pre-admission testing department's nursing staff.  Clinical Discussion:  Alyssa Lawson is a 77 y.o. female who is submitted for pre-surgical anesthesia review and clearance prior to her undergoing the above procedure. Patient is a Former Smoker (9 pack years; quit 11/1995). Pertinent PMH includes: CAD, CHF, [place], atrial tachycardia, HTN, HLD, diet-controlled T2DM, GERD (on daily PPI), anemia, OA, cervical DDD, chronic lower back pain, RLS, anxiety.  Patient is followed by cardiology Alyssa Situ, MD). She was last seen in the cardiology clinic on 09/13/2021; notes reviewed.  At the time of her clinic visit, patient doing well overall from a cardiovascular perspective.  She denied any episodes of chest pain, shortness of breath, PND, orthopnea, palpitations, significant peripheral edema, vertiginous  symptoms, or presyncope/syncope.  Patient with a past medical history significant for cardiovascular diagnoses.  Myocardial perfusion imaging study performed on 04/30/2017 revealed a normal left ventricular systolic function with an EF of 63%.  There were no regional wall motion abnormalities.  Exercise tolerance overall was poor; able to complete 3:30 of the study.  Large area of regional ischemia in the mid to apical anterolateral wall and entire lateral wall noted.  There were no associated ECG changes concerning for ischemia at peak stress or in recovery.  Artifact from GI uptake noted.  Study determined to be high risk.  Diagnostic LEFT heart catheterization performed on 05/01/2017 revealing a normal left ventricular systolic function with an EF of 55-65%.  Coronary anatomy revealed no evidence of significant obstructive CAD signifying a false positive stress test.  Patient with moderate mitral valve regurgitation in the setting of ectopy.  There was no aortic valve stenosis. At the completion of the procedure, patient developed atrial tachycardia, which was treated with metoprolol 5 mg IV converting him back to NSR at a controlled rate.  Last TTE was performed on 05/01/2017 revealing a normal left ventricular systolic function with an EF of 55-60%.  There were no regional wall motion abnormalities.  There was mild to moderate mitral valve regurgitation.  Left atrium was mildly dilated.  PASP mildly elevated at 35 mmHg.  There was a small to moderate pericardial effusion noted.  All transvalvular gradients were noted to be normal with no evidence of valvular stenosis.  Blood pressure mildly elevated at 130/78 on currently prescribed beta-blocker (carvedilol), alpha blocker (clonidine), vasodilator (hydralazine) (nitrate (isosorbide mononitrate) and ARB (olmesartan) therapies.  Patient is on atorvastatin for her HLD diagnosis and further ASCVD prevention.  Patient with a  T2DM diagnosis that is being  controlled slowly with diet and lifestyle modification; last HgbA1c was 6.2% when checked on 05/07/2022. Patient does not have an OSAH diagnosis. Functional capacity, as defined by DASI, is documented as being >/= 4 METS.  No changes were made to her medication regimen.  Patient to follow-up with outpatient cardiology in 1 year or sooner if needed.  Alyssa Lawson is scheduled for an elective RIGHT THUMB [place] on 07/23/2022 with Dr. Milagros Evener, MD.  Given patient's past medical history significant for cardiovascular diagnoses, presurgical cardiac clearance was sought by the PAT team. Per cardiology, "the patient is doing well from a cardiac perspective. According to the RCRI, her perioperative risk of major cardiac event is 0.9%. Her functional capacity in METs is 6.61 according to the DASI. Therefore, based on ACC/AHA guidelines, the patient would be at acceptable risk for the planned procedure without further cardiovascular testing".   In review of her medication reconciliation, it is noted that patient is currently on prescribed daily antiplatelet therapy. She has been instructed on recommendations for holding her daily low dose ASA for 5 days prior to her procedure with plans to restart as soon as postoperative bleeding risk felt to be minimized by her attending surgeon. The patient has been instructed that her last dose of her ASA should be on 07/17/2022.  Patient denies previous perioperative complications with anesthesia in the past.  In review her EMR, there are no records available for review pertaining to past procedural/anesthetic courses within the [facility] system.     07/16/2022    3:00 PM 05/07/2022    2:46 PM 04/28/2022   10:21 AM  Vitals with BMI  Height '5\' 4"'$  '5\' 4"'$  '5\' 4"'$   Weight 154 lbs 5 oz 154 lbs 10 oz 159 lbs  BMI 26.48 92.42 68.34  Systolic  196   Diastolic  78   Pulse  73     Providers/Specialists:   NOTE: Primary physician provider listed below.  Patient may have been seen by APP or partner within same practice.   PROVIDER ROLE / SPECIALTY LAST OV  Poggi, Marshall Cork, MD Orthopedics (Surgeon) 07/14/2022  Steele Sizer, MD Primary Care Provider 05/07/2022  Ida Rogue, MD Cardiology 09/13/2021   Allergies:  Amlodipine and Meloxicam  Current Home Medications:   No current facility-administered medications for this encounter.    acetaminophen (TYLENOL) 500 MG tablet   aspirin 81 MG chewable tablet   atorvastatin (LIPITOR) 40 MG tablet   busPIRone (BUSPAR) 5 MG tablet   carvedilol (COREG) 25 MG tablet   celecoxib (CELEBREX) 100 MG capsule   Cholecalciferol (VITAMIN D) 50 MCG (2000 UT) CAPS   cloNIDine (CATAPRES) 0.1 MG tablet   Cyanocobalamin (VITAMIN B-12) 2000 MCG TBCR   ferrous sulfate 325 (65 FE) MG tablet   hydrALAZINE (APRESOLINE) 25 MG tablet   ibuprofen (ADVIL) 100 MG tablet   isosorbide mononitrate (IMDUR) 30 MG 24 hr tablet   MAGNESIUM-ZINC PO   Multiple Vitamin (MULTIVITAMIN) capsule   olmesartan (BENICAR) 40 MG tablet   Olopatadine-Mometasone (RYALTRIS) 665-25 MCG/ACT SUSP   omeprazole (PRILOSEC OTC) 20 MG tablet   oxybutynin (DITROPAN-XL) 5 MG 24 hr tablet   pramipexole (MIRAPEX) 0.5 MG tablet   tiZANidine (ZANAFLEX) 2 MG tablet   valACYclovir (VALTREX) 500 MG tablet   vitamin C (ASCORBIC ACID) 500 MG tablet   History:   Past Medical History:  Diagnosis Date   Allergy    Anemia    Anxiety  12/04/2021   Arthritis    Atrial tachycardia    Caregiver burden    taking care of husband with dementia   Cervical cancer ([place])    CHF (congestive heart failure) (HCC)    Chronic lower back pain    Coronary artery disease, non-occlusive    a. LHC 05/01/2017: LM no sig dz, LAD no sig dz, LCx no sig dz, RCA no sig dz, EF 55-65%, mod MR   DDD (degenerative disc disease), cervical    Diet-controlled type 2 diabetes mellitus (HCC)    Endometriosis    GERD (gastroesophageal reflux disease)    History of  echocardiogram    a.) TTE 05/01/2017: EF 55-60%, no RWMA, mild-mod MR, mildly dilated LA, RVSF norm, PASP 35 mmHg, sm-mod pericardial effusion   Hyperlipidemia    Hypertension    Restless leg syndrome    a.) on pramipexole   Unstable angina (HCC)    Urinary incontinence    Past Surgical History:  Procedure Laterality Date   ABDOMINAL HYSTERECTOMY  1976   CARDIAC CATHETERIZATION     LEFT HEART CATH AND CORONARY ANGIOGRAPHY N/A 05/01/2017   Procedure: LEFT HEART CATH AND CORONARY ANGIOGRAPHY;  Surgeon: Minna Merritts, MD;  Location: [place] [facility];  Service: Cardiovascular;  Laterality: N/A;   OOPHORECTOMY     Family History  Problem Relation Age of Onset   Heart failure Father    Arrhythmia Father    Hypertension Father    Arrhythmia Sister    Hypertension Sister    Congestive Heart Failure Brother    Hypertension Brother    Cardiomyopathy Sister    Diabetes Brother    Breast cancer Paternal Aunt 60   Social History   Tobacco Use   Smoking status: Former    Packs/day: 0.50    Years: 18.00    Total pack years: 9.00    Types: Cigarettes    Start date: 06/23/1956    Quit date: 11/22/1995    Years since quitting: 26.6   Smokeless tobacco: Former    Quit date: 11/22/1996   Tobacco comments:    None  Vaping Use   Vaping Use: Never used  Substance Use Topics   Alcohol use: Yes    Alcohol/week: 1.0 standard drink of alcohol    Types: 1 Glasses of wine per week    Comment: glass of wine occasionally maybe once a month   Drug use: No    Pertinent Clinical Results:  LABS: Labs reviewed: Acceptable for surgery.  No visits with results within 3 Day(s) from this visit.  Latest known visit with results is:  Office Visit on 05/07/2022  Component Date Value Ref Range Status   Hemoglobin A1C 05/07/2022 6.2 (A)  4.0 - 5.6 % Final   Creatinine, Urine 05/07/2022 51  20 - 275 mg/dL Final   Microalb, Ur 05/07/2022 0.7  mg/dL Final   Comment: Reference Range Not  established    Microalb Creat Ratio 05/07/2022 14  <30 mcg/mg creat Final   Comment: . The ADA defines abnormalities in albumin excretion as follows: Marland Kitchen Albuminuria Category        Result (mcg/mg creatinine) . Normal to Mildly increased   <30 Moderately increased         30-299  Severely increased           > OR = 300 . The ADA recommends that at least two of three specimens collected within a 3-6 month period be abnormal before considering a  patient to be within a diagnostic category.    Glucose, Bld 05/07/2022 95  65 - 99 mg/dL Final   Comment: .            Fasting reference interval .    BUN 05/07/2022 15  7 - 25 mg/dL Final   Creat 05/07/2022 0.89  0.60 - 1.00 mg/dL Final   eGFR 05/07/2022 67  > OR = 60 mL/min/1.77m Final   BUN/Creatinine Ratio 05/07/2022 SEE NOTE:  6 - 22 (calc) Final   Comment:    Not Reported: BUN and Creatinine are within    reference range. .    Sodium 05/07/2022 142  135 - 146 mmol/L Final   Potassium 05/07/2022 4.0  3.5 - 5.3 mmol/L Final   Chloride 05/07/2022 104  98 - 110 mmol/L Final   CO2 05/07/2022 32  20 - 32 mmol/L Final   Calcium 05/07/2022 9.7  8.6 - 10.4 mg/dL Final   Total Protein 05/07/2022 6.4  6.1 - 8.1 g/dL Final   Albumin 05/07/2022 4.1  3.6 - 5.1 g/dL Final   Globulin 05/07/2022 2.3  1.9 - 3.7 g/dL (calc) Final   AG Ratio 05/07/2022 1.8  1.0 - 2.5 (calc) Final   Total Bilirubin 05/07/2022 0.7  0.2 - 1.2 mg/dL Final   Alkaline phosphatase (APISO) 05/07/2022 52  37 - 153 U/L Final   AST 05/07/2022 22  10 - 35 U/L Final   ALT 05/07/2022 21  6 - 29 U/L Final   Cholesterol 05/07/2022 132  <200 mg/dL Final   HDL 05/07/2022 65  > OR = 50 mg/dL Final   Triglycerides 05/07/2022 53  <150 mg/dL Final   LDL Cholesterol (Calc) 05/07/2022 54  mg/dL (calc) Final   Comment: Reference range: <100 . Desirable range <100 mg/dL for primary prevention;   <70 mg/dL for patients with CHD or diabetic patients  with > or = 2 CHD risk  factors. .Marland KitchenLDL-C is now calculated using the Martin-Hopkins  calculation, which is a validated novel method providing  better accuracy than the Friedewald equation in the  estimation of LDL-C.  MCresenciano Genreet al. JAnnamaria Helling [id number];262(03: 2061-2068  (http://education.[employer].com/faq/FAQ164)    Total CHOL/HDL Ratio 05/07/2022 2.0  <5.0 (calc) Final   Non-HDL Cholesterol (Calc) 05/07/2022 67  <130 mg/dL (calc) Final   Comment: For patients with diabetes plus 1 major ASCVD risk  factor, treating to a non-HDL-C goal of <100 mg/dL  (LDL-C of <70 mg/dL) is considered a therapeutic  option.    WBC 05/07/2022 5.7  3.8 - 10.8 Thousand/uL Final   RBC 05/07/2022 4.28  3.80 - 5.10 Million/uL Final   Hemoglobin 05/07/2022 13.5  11.7 - 15.5 g/dL Final   HCT 05/07/2022 39.5  35.0 - 45.0 % Final   MCV 05/07/2022 92.3  80.0 - 100.0 fL Final   MCH 05/07/2022 31.5  27.0 - 33.0 pg Final   MCHC 05/07/2022 34.2  32.0 - 36.0 g/dL Final   RDW 05/07/2022 12.0  11.0 - 15.0 % Final   Platelets 05/07/2022 184  140 - 400 Thousand/uL Final   MPV 05/07/2022 12.2  7.5 - 12.5 fL Final   Neutro Abs 05/07/2022 3,215  1,500 - 7,800 cells/uL Final   Lymphs Abs 05/07/2022 1,767  850 - 3,900 cells/uL Final   Absolute Monocytes 05/07/2022 456  200 - 950 cells/uL Final   Eosinophils Absolute 05/07/2022 211  15 - 500 cells/uL Final   Basophils Absolute 05/07/2022 51  0 - 200 cells/uL Final  Neutrophils Relative % 05/07/2022 56.4  % Final   Total Lymphocyte 05/07/2022 31.0  % Final   Monocytes Relative 05/07/2022 8.0  % Final   Eosinophils Relative 05/07/2022 3.7  % Final   Basophils Relative 05/07/2022 0.9  % Final   Smear Review 05/07/2022    Final   Comment: Few atypical lymphocytes noted Review of peripheral smear confirms automated results.     ECG: Date: 07/21/2022 Time ECG obtained: 1401 PM Rate: 74 bpm Rhythm:  Sinus rhythm with PACs Axis (leads I and aVF): Right axis deviation Intervals: PR 158 ms.  QRS 102 ms. QTc 439 ms. ST segment and T wave changes: No evidence of acute ST segment elevation or depression.  Evidence of an age undetermined septal infarct present. Comparison: Similar to previous tracing obtained on 09/13/2021   IMAGING / PROCEDURES: TRANSTHORACIC ECHOCARDIOGRAM performed on 05/01/2017 Normal left ventricular systolic function with an EF of 55-60% There were no regional wall motion abnormalities Normal left ventricular diastolic Doppler parameters Left atrium mildly dilated Normal right ventricular systolic function Mild to moderate mitral valve regurgitation Normal transvalvular gradients; no valvular stenosis Small to moderate pericardial effusion  LEFT HEART CATHETERIZATION AND CORONARY ANGIOGRAPHY performed on 05/01/2017 Normal left ventricular systolic function with an EF of 55-65% RIGHT coronary dominance No evidence of obstructive CAD Moderate MR  No aortic valve stenosis Atrial tachycardia noted following case completion; Tx'd with metoprolol 5 mg IV x 1 converting patient back to NSR  MYOCARDIAL PERFUSION IMAGING STUDY (LEXISCAN) performed on  04/30/2017 Normal left ventricular systolic function with a normal LVEF of 63% Normal myocardial thickening and wall motion Left ventricular cavity size normal SPECT images demonstrate a large region of ischemia in the mid to apical anteroseptal wall at her wall and No evidence of stress-induced myocardial ischemia or arrhythmia High risk study  Impression and Plan:  Alyssa Lawson has been referred for pre-anesthesia review and clearance prior to her undergoing the planned anesthetic and procedural courses. Available labs, pertinent testing, and imaging results were personally reviewed by me. This patient has been appropriately cleared by cardiology with an overall ACCEPTABLE risk of significant perioperative cardiovascular complications.  Based on clinical review performed today (07/21/22), barring any  significant acute changes in the patient's overall condition, it is anticipated that she will be able to proceed with the planned surgical intervention. Any acute changes in clinical condition may necessitate her procedure being postponed and/or cancelled. Patient will meet with anesthesia team (MD and/or CRNA) on the day of her procedure for preoperative evaluation/assessment. Questions regarding anesthetic course will be fielded at that time.   Pre-surgical instructions were reviewed with the patient during her PAT appointment and questions were fielded by PAT clinical staff. Patient was advised that if any questions or concerns arise prior to her procedure then she should return a call to PAT and/or her surgeon's office to discuss.  Honor Loh, MSN, APRN, FNP-C, CEN [facility]  Peri-operative Services Nurse Practitioner Phone: [phone] Fax: [phone] 07/21/22 2:11 PM  NOTE: This note has been prepared using Dragon dictation software. Despite my best ability to proofread, there is always the potential that unintentional transcriptional errors may still occur from this process.

## 2022-07-17 NOTE — Telephone Encounter (Signed)
   Name: Alyssa Lawson  DOB: 1946/05/16  MRN: [id number]  Primary Cardiologist: None  Chart reviewed as part of pre-operative protocol coverage. Because of [name]'s past medical history and time since last visit, she will require a follow-up telephone visit in order to better assess preoperative cardiovascular risk.  Pre-op covering staff: - Please schedule appointment and call patient to inform them. If patient already had an upcoming appointment within acceptable timeframe, please add "pre-op clearance" to the appointment notes so provider is aware. - Please contact requesting surgeon's office via preferred method (i.e, phone, fax) to inform them of need for appointment prior to surgery.  Okay to hold ASA x 5 days if patient is asymptomatic at the time of the phone call appointment.    Elgie Collard, PA-C  07/17/2022, 11:30 AM

## 2022-07-20 NOTE — Progress Notes (Unsigned)
Virtual Visit via Telephone Note   Because of Alyssa Lawson's co-morbid illnesses, she is at least at moderate risk for complications without adequate follow up.  This format is felt to be most appropriate for this patient at this time.  The patient did not have access to video technology/had technical difficulties with video requiring transitioning to audio format only (telephone).  All issues noted in this document were discussed and addressed.  No physical exam could be performed with this format.  Please refer to the patient's chart for her consent to telehealth for [facility].  Evaluation Performed:  Preoperative cardiovascular risk assessment _____________   Date:  07/20/2022   Patient ID:  Alyssa Lawson, DOB 10-27-1945, MRN [id number] Patient Location:  Home Provider location:   Office  Primary Care Provider:  Steele Sizer, MD Primary Cardiologist:  None  Chief Complaint / Patient Profile   77 y.o. y/o female with a h/o HTN, metabolic syndrome, HLD, former tobacco abuse, no significant CAD on cardiac catheterization 04/2017, mild to moderate MR, atrial tachycardia who is pending right thumb CMC suspension arthroplasty and presents today for telephonic preoperative cardiovascular risk assessment.  History of Present Illness    Alyssa Lawson is a 76 y.o. female who presents via audio/video conferencing for a telehealth visit today.  Pt was last seen in cardiology clinic on 09/13/21 by Dr. [name].  At that time Alyssa Lawson was doing well.  The patient is now pending procedure as outlined above. Since her last visit, she denies chest pain, shortness of breath, lower extremity edema, fatigue, palpitations, melena, hematuria, hemoptysis, diaphoresis, weakness, presyncope, syncope, orthopnea, and PND. She remains active at home taking care of her husband who requires full-time care. She helps him with all ADLs and transports him to appointments.   Past Medical History     Past Medical History:  Diagnosis Date   Allergy    Anemia    Anxiety 12/04/2021   Arthritis    Atrial tachycardia    Caregiver burden    taking care of husband with dementia   Cervical cancer ([place])    CHF (congestive heart failure) (HCC)    Chronic lower back pain    Coronary artery disease, non-occlusive    a. LHC 05/01/2017: LM no sig dz, LAD no sig dz, LCx no sig dz, RCA no sig dz, EF 55-65%, mod MR   DDD (degenerative disc disease), cervical    Diet-controlled type 2 diabetes mellitus (HCC)    Endometriosis    GERD (gastroesophageal reflux disease)    History of echocardiogram    a.) TTE 05/01/2017: EF 55-60%, no RWMA, mild-mod MR, mildly dilated LA, RVSF norm, PASP 35 mmHg, sm-mod pericardial effusion   Hyperlipidemia    Hypertension    Restless leg syndrome    a.) on pramipexole   Unstable angina (HCC)    Urinary incontinence    Past Surgical History:  Procedure Laterality Date   ABDOMINAL HYSTERECTOMY  1976   CARDIAC CATHETERIZATION     LEFT HEART CATH AND CORONARY ANGIOGRAPHY N/A 05/01/2017   Procedure: LEFT HEART CATH AND CORONARY ANGIOGRAPHY;  Surgeon: Minna Merritts, MD;  Location: [facility];  Service: Cardiovascular;  Laterality: N/A;   OOPHORECTOMY      Allergies  Allergies  Allergen Reactions   Amlodipine Swelling   Meloxicam Palpitations    Home Medications    Prior to Admission medications   Medication Sig Start Date End Date Taking? Authorizing  Provider  acetaminophen (TYLENOL) 500 MG tablet Take 1 tablet (500 mg total) by mouth every 6 (six) hours as needed for headache. 11/20/14   Betancourt, Aura Fey, NP  aspirin 81 MG chewable tablet Chew 1 tablet by mouth at bedtime. 01/23/11   [provider]  atorvastatin (LIPITOR) 40 MG tablet Take 1 tablet (40 mg total) by mouth daily. Patient taking differently: Take 40 mg by mouth at bedtime. 09/13/21   Minna Merritts, MD  busPIRone (BUSPAR) 5 MG tablet TAKE 1 TABLET(5 MG) BY MOUTH  TWICE DAILY AS NEEDED 05/07/22   Ancil Boozer, Drue Stager, MD  carvedilol (COREG) 25 MG tablet TAKE 1 TABLET BY MOUTH TWICE  DAILY WITH MEALS 05/26/22   Minna Merritts, MD  celecoxib (CELEBREX) 100 MG capsule Take 100 mg by mouth every evening. 05/05/22   [provider]  Cholecalciferol (VITAMIN D) 50 MCG (2000 UT) CAPS Take 1 capsule by mouth daily.    [provider]  cloNIDine (CATAPRES) 0.1 MG tablet TAKE 1 TABLET BY MOUTH IN THE  EVENING 07/15/22   Minna Merritts, MD  Cyanocobalamin (VITAMIN B-12) 2000 MCG TBCR Take 1 tablet by mouth daily.    [provider]  ferrous sulfate 325 (65 FE) MG tablet Take 325 mg by mouth daily with breakfast.    [provider]  hydrALAZINE (APRESOLINE) 25 MG tablet TAKE 1 TABLET BY MOUTH 3 TIMES  DAILY 07/15/22   Minna Merritts, MD  ibuprofen (ADVIL) 100 MG tablet Take 100 mg by mouth every 6 (six) hours as needed for fever.    [provider]  isosorbide mononitrate (IMDUR) 30 MG 24 hr tablet TAKE 1 TABLET BY MOUTH DAILY Patient taking differently: Take 30 mg by mouth daily after lunch. Takes at 3 pm 05/26/22   Minna Merritts, MD  MAGNESIUM-ZINC PO Take 1 tablet by mouth daily.    [provider]  Multiple Vitamin (MULTIVITAMIN) capsule Take 1 capsule by mouth daily.    [provider]  olmesartan (BENICAR) 40 MG tablet TAKE 1 TABLET BY MOUTH DAILY 11/19/21   Minna Merritts, MD  Olopatadine-Mometasone (RYALTRIS) [id number] MCG/ACT SUSP Place 2 sprays into both nostrils daily. 10/23/21   Delsa Grana, PA-C  omeprazole (PRILOSEC OTC) 20 MG tablet Take 20 mg by mouth as needed.    [provider]  oxybutynin (DITROPAN-XL) 5 MG 24 hr tablet Take 1 tablet (5 mg total) by mouth at bedtime. Same dose but now it is sustained released. Once a day dose 05/07/22   Steele Sizer, MD  pramipexole (MIRAPEX) 0.5 MG tablet TAKE 1 TABLET BY MOUTH AT  BEDTIME AS NEEDED 06/11/22   Ancil Boozer, Drue Stager, MD   tiZANidine (ZANAFLEX) 2 MG tablet TAKE 1 TABLET BY MOUTH  EVERY 8 HOURS AS NEEDED FOR MUSCLE SPASM(S) 12/04/21   Steele Sizer, MD  valACYclovir (VALTREX) 500 MG tablet TAKE 1 TABLET BY MOUTH 2  TIMES DAILY. PER EPISODE AS NEEDED, TAKE FOR 10 DAYS Patient taking differently: Take 500 mg by mouth as needed. 05/25/16   Steele Sizer, MD  vitamin C (ASCORBIC ACID) 500 MG tablet Take 500 mg by mouth daily.    [provider]    Physical Exam    Vital Signs:  KELILAH HEBARD does not have vital signs available for review today.  Given telephonic nature of communication, physical exam is limited. AAOx3. NAD. Normal affect.  Speech and respirations are unlabored.  Accessory Clinical Findings    None  Assessment & Plan    1.  Preoperative Cardiovascular Risk Assessment: The patient is doing well from a cardiac perspective. Therefore, based on ACC/AHA guidelines, the patient would be at acceptable risk for the planned procedure without further cardiovascular testing. According to the Revised Cardiac Risk Index (RCRI), her Perioperative Risk of Major Cardiac Event is (%): 0.9 Her Functional Capacity in METs is: 6.61 according to the [facility] Activity Status Index (DASI).  The patient was advised that if she develops new symptoms prior to surgery to contact our office to arrange for a follow-up visit, and she verbalized understanding.  Per office protocol, she may hold ASA x 5 days and should resume as soon as hemodynamically stable following the procedure.   A copy of this note will be routed to requesting surgeon.  Time:   Today, I have spent 10 minutes with the patient with telehealth technology discussing medical history, symptoms, and management plan.     Emmaline Life, NP-C  07/21/2022, 10:27 AM [address], [address] Office [phone] Fax [phone]

## 2022-07-21 ENCOUNTER — Encounter: Payer: Self-pay | Admitting: Nurse Practitioner

## 2022-07-21 ENCOUNTER — Encounter
Admission: RE | Admit: 2022-07-21 | Discharge: 2022-07-21 | Disposition: A | Discharge status: Home / Self Care | Payer: Medicare Other | Source: Ambulatory Visit | Attending: Surgery | Admitting: Surgery

## 2022-07-21 ENCOUNTER — Ambulatory Visit: Payer: Medicare Other | Attending: Cardiology | Admitting: Nurse Practitioner

## 2022-07-21 DIAGNOSIS — E118 Type 2 diabetes mellitus with unspecified complications: Secondary | ICD-10-CM

## 2022-07-21 DIAGNOSIS — I200 Unstable angina: Secondary | ICD-10-CM | POA: Insufficient documentation

## 2022-07-21 DIAGNOSIS — I10 Essential (primary) hypertension: Secondary | ICD-10-CM | POA: Diagnosis not present

## 2022-07-21 DIAGNOSIS — R9431 Abnormal electrocardiogram [ECG] [EKG]: Secondary | ICD-10-CM

## 2022-07-21 DIAGNOSIS — Z01810 Encounter for preprocedural cardiovascular examination: Secondary | ICD-10-CM | POA: Insufficient documentation

## 2022-07-21 DIAGNOSIS — I4719 Other supraventricular tachycardia: Secondary | ICD-10-CM

## 2022-07-21 DIAGNOSIS — Z794 Long term (current) use of insulin: Secondary | ICD-10-CM | POA: Insufficient documentation

## 2022-07-21 MED ORDER — ISOSORBIDE MONONITRATE ER 30 MG PO TB24: 30.0000 mg | ORAL_TABLET | Freq: Every day | ORAL | 0 refills | Status: DC

## 2022-07-23 ENCOUNTER — Ambulatory Visit: Payer: Medicare Other

## 2022-07-23 ENCOUNTER — Ambulatory Visit
Admission: RE | Admit: 2022-07-23 | Discharge: 2022-07-23 | Disposition: A | Discharge status: Home / Self Care | Payer: Medicare Other | Source: Ambulatory Visit | Attending: Surgery | Admitting: Surgery

## 2022-07-23 ENCOUNTER — Other Ambulatory Visit: Payer: Self-pay

## 2022-07-23 ENCOUNTER — Encounter: Payer: Self-pay | Admitting: Surgery

## 2022-07-23 ENCOUNTER — Encounter
Admission: RE | Disposition: A | Discharge status: Home / Self Care | Payer: Self-pay | Source: Ambulatory Visit | Attending: Surgery

## 2022-07-23 ENCOUNTER — Ambulatory Visit: Payer: Medicare Other | Admitting: Urgent Care

## 2022-07-23 DIAGNOSIS — Z87891 Personal history of nicotine dependence: Secondary | ICD-10-CM | POA: Insufficient documentation

## 2022-07-23 DIAGNOSIS — Z794 Long term (current) use of insulin: Secondary | ICD-10-CM | POA: Insufficient documentation

## 2022-07-23 DIAGNOSIS — E119 Type 2 diabetes mellitus without complications: Secondary | ICD-10-CM | POA: Insufficient documentation

## 2022-07-23 DIAGNOSIS — I509 Heart failure, unspecified: Secondary | ICD-10-CM | POA: Insufficient documentation

## 2022-07-23 DIAGNOSIS — G8929 Other chronic pain: Secondary | ICD-10-CM | POA: Insufficient documentation

## 2022-07-23 DIAGNOSIS — F419 Anxiety disorder, unspecified: Secondary | ICD-10-CM | POA: Insufficient documentation

## 2022-07-23 DIAGNOSIS — I25110 Atherosclerotic heart disease of native coronary artery with unstable angina pectoris: Secondary | ICD-10-CM | POA: Diagnosis not present

## 2022-07-23 DIAGNOSIS — M1811 Unilateral primary osteoarthritis of first carpometacarpal joint, right hand: Secondary | ICD-10-CM | POA: Diagnosis not present

## 2022-07-23 DIAGNOSIS — I110 Hypertensive heart disease with heart failure: Secondary | ICD-10-CM | POA: Diagnosis not present

## 2022-07-23 DIAGNOSIS — K219 Gastro-esophageal reflux disease without esophagitis: Secondary | ICD-10-CM | POA: Insufficient documentation

## 2022-07-23 DIAGNOSIS — E785 Hyperlipidemia, unspecified: Secondary | ICD-10-CM | POA: Diagnosis not present

## 2022-07-23 DIAGNOSIS — Z791 Long term (current) use of non-steroidal anti-inflammatories (NSAID): Secondary | ICD-10-CM | POA: Insufficient documentation

## 2022-07-23 DIAGNOSIS — A6000 Herpesviral infection of urogenital system, unspecified: Secondary | ICD-10-CM

## 2022-07-23 DIAGNOSIS — M545 Low back pain, unspecified: Secondary | ICD-10-CM | POA: Diagnosis not present

## 2022-07-23 DIAGNOSIS — I739 Peripheral vascular disease, unspecified: Secondary | ICD-10-CM | POA: Diagnosis not present

## 2022-07-23 DIAGNOSIS — D649 Anemia, unspecified: Secondary | ICD-10-CM | POA: Insufficient documentation

## 2022-07-23 HISTORY — DX: Type 2 diabetes mellitus without complications: E11.9

## 2022-07-23 HISTORY — DX: Unspecified urinary incontinence: R32

## 2022-07-23 HISTORY — DX: Personal history of other medical treatment: Z92.89

## 2022-07-23 HISTORY — DX: Other chronic pain: G89.29

## 2022-07-23 HISTORY — PX: CARPOMETACARPAL (CMC) FUSION OF THUMB: SHX6290

## 2022-07-23 HISTORY — DX: Other cervical disc degeneration, unspecified cervical region: M50.30

## 2022-07-23 HISTORY — DX: Malignant neoplasm of cervix uteri, unspecified: C53.9

## 2022-07-23 HISTORY — DX: Low back pain, unspecified: M54.50

## 2022-07-23 SURGERY — CARPOMETACARPAL (CMC) FUSION OF THUMB
Anesthesia: General | Site: Thumb | Laterality: Right

## 2022-07-23 MED ORDER — ONDANSETRON HCL 4 MG/2ML IJ SOLN: 4.0000 mg | Freq: Once | INTRAMUSCULAR | Status: DC | PRN

## 2022-07-23 MED ORDER — DEXAMETHASONE SODIUM PHOSPHATE 10 MG/ML IJ SOLN
INTRAMUSCULAR | Status: DC | PRN
  Administered 2022-07-23: 4 mg via INTRAVENOUS

## 2022-07-23 MED ORDER — CEFAZOLIN SODIUM-DEXTROSE 2-4 GM/100ML-% IV SOLN
2.0000 g | INTRAVENOUS | Status: AC
  Administered 2022-07-23: 2 g via INTRAVENOUS

## 2022-07-23 MED ORDER — ONDANSETRON HCL 4 MG/2ML IJ SOLN: 4.0000 mg | Freq: Four times a day (QID) | INTRAMUSCULAR | Status: DC | PRN

## 2022-07-23 MED ORDER — SODIUM CHLORIDE 0.9 % IV SOLN: INTRAVENOUS | Status: DC

## 2022-07-23 MED ORDER — LACTATED RINGERS IV SOLN: INTRAVENOUS | Status: DC | PRN

## 2022-07-23 MED ORDER — OXYCODONE HCL 5 MG PO TABS
5.0000 mg | ORAL_TABLET | ORAL | Status: DC | PRN
  Administered 2022-07-23: 5 mg via ORAL

## 2022-07-23 MED ORDER — OXYCODONE HCL 5 MG PO TABS
ORAL_TABLET | ORAL | Status: AC
  Filled 2022-07-23: qty 1

## 2022-07-23 MED ORDER — VALACYCLOVIR HCL 500 MG PO TABS: 500.0000 mg | ORAL_TABLET | ORAL | Status: DC | PRN

## 2022-07-23 MED ORDER — CEFAZOLIN SODIUM-DEXTROSE 2-4 GM/100ML-% IV SOLN
INTRAVENOUS | Status: AC
  Filled 2022-07-23: qty 100

## 2022-07-23 MED ORDER — LIDOCAINE HCL (CARDIAC) PF 100 MG/5ML IV SOSY
PREFILLED_SYRINGE | INTRAVENOUS | Status: DC | PRN
  Administered 2022-07-23 (×2): 40 mg via INTRAVENOUS

## 2022-07-23 MED ORDER — SEVOFLURANE IN SOLN
RESPIRATORY_TRACT | Status: AC
  Filled 2022-07-23: qty 250

## 2022-07-23 MED ORDER — ORAL CARE MOUTH RINSE: 15.0000 mL | Freq: Once | OROMUCOSAL | Status: AC

## 2022-07-23 MED ORDER — FENTANYL CITRATE (PF) 100 MCG/2ML IJ SOLN
INTRAMUSCULAR | Status: DC | PRN
  Administered 2022-07-23 (×4): 25 ug via INTRAVENOUS

## 2022-07-23 MED ORDER — METOCLOPRAMIDE HCL 10 MG PO TABS: 5.0000 mg | ORAL_TABLET | Freq: Three times a day (TID) | ORAL | Status: DC | PRN

## 2022-07-23 MED ORDER — EPHEDRINE SULFATE (PRESSORS) 50 MG/ML IJ SOLN
INTRAMUSCULAR | Status: DC | PRN
  Administered 2022-07-23: 10 mg via INTRAVENOUS

## 2022-07-23 MED ORDER — ONDANSETRON HCL 4 MG PO TABS: 4.0000 mg | ORAL_TABLET | Freq: Four times a day (QID) | ORAL | Status: DC | PRN

## 2022-07-23 MED ORDER — GLYCOPYRROLATE 0.2 MG/ML IJ SOLN
INTRAMUSCULAR | Status: DC | PRN
  Administered 2022-07-23: .1 mg via INTRAVENOUS

## 2022-07-23 MED ORDER — BUPIVACAINE HCL (PF) 0.5 % IJ SOLN
INTRAMUSCULAR | Status: DC | PRN
  Administered 2022-07-23: 10 mL

## 2022-07-23 MED ORDER — BUPIVACAINE HCL (PF) 0.5 % IJ SOLN
INTRAMUSCULAR | Status: AC
  Filled 2022-07-23: qty 30

## 2022-07-23 MED ORDER — ONDANSETRON HCL 4 MG/2ML IJ SOLN
INTRAMUSCULAR | Status: DC | PRN
  Administered 2022-07-23: 4 mg via INTRAVENOUS

## 2022-07-23 MED ORDER — LACTATED RINGERS IV SOLN: INTRAVENOUS | Status: DC

## 2022-07-23 MED ORDER — OXYCODONE HCL 5 MG PO TABS: 5.0000 mg | ORAL_TABLET | ORAL | 0 refills | Status: DC | PRN

## 2022-07-23 MED ORDER — MIDAZOLAM HCL 2 MG/2ML IJ SOLN
INTRAMUSCULAR | Status: AC
  Filled 2022-07-23: qty 2

## 2022-07-23 MED ORDER — 0.9 % SODIUM CHLORIDE (POUR BTL) OPTIME
TOPICAL | Status: DC | PRN
  Administered 2022-07-23: 500 mL

## 2022-07-23 MED ORDER — CHLORHEXIDINE GLUCONATE 0.12 % MT SOLN
OROMUCOSAL | Status: AC
  Administered 2022-07-23: 15 mL via OROMUCOSAL
  Filled 2022-07-23: qty 15

## 2022-07-23 MED ORDER — PROPOFOL 10 MG/ML IV BOLUS
INTRAVENOUS | Status: AC
  Filled 2022-07-23: qty 20

## 2022-07-23 MED ORDER — FENTANYL CITRATE (PF) 100 MCG/2ML IJ SOLN: 25.0000 ug | INTRAMUSCULAR | Status: DC | PRN

## 2022-07-23 MED ORDER — CHLORHEXIDINE GLUCONATE 0.12 % MT SOLN: 15.0000 mL | Freq: Once | OROMUCOSAL | Status: AC

## 2022-07-23 MED ORDER — ATORVASTATIN CALCIUM 40 MG PO TABS: 40.0000 mg | ORAL_TABLET | Freq: Every evening | ORAL | Status: DC

## 2022-07-23 MED ORDER — PROPOFOL 10 MG/ML IV BOLUS
INTRAVENOUS | Status: DC | PRN
  Administered 2022-07-23 (×2): 50 mg via INTRAVENOUS
  Administered 2022-07-23: 100 mg via INTRAVENOUS

## 2022-07-23 MED ORDER — PHENYLEPHRINE 80 MCG/ML (10ML) SYRINGE FOR IV PUSH (FOR BLOOD PRESSURE SUPPORT)
PREFILLED_SYRINGE | INTRAVENOUS | Status: DC | PRN
  Administered 2022-07-23: 40 ug via INTRAVENOUS
  Administered 2022-07-23 (×4): 80 ug via INTRAVENOUS
  Administered 2022-07-23 (×2): 40 ug via INTRAVENOUS

## 2022-07-23 MED ORDER — METOCLOPRAMIDE HCL 5 MG/ML IJ SOLN: 5.0000 mg | Freq: Three times a day (TID) | INTRAMUSCULAR | Status: DC | PRN

## 2022-07-23 MED ORDER — FENTANYL CITRATE (PF) 100 MCG/2ML IJ SOLN
INTRAMUSCULAR | Status: AC
  Filled 2022-07-23: qty 2

## 2022-07-23 SURGICAL SUPPLY — 45 items
APL PRP STRL LF DISP 70% ISPRP (MISCELLANEOUS) ×1
BLADE DEBAKEY 8.0 (BLADE) ×1 IMPLANT
BLADE OSC/SAGITTAL 5.5X25 (BLADE) ×1 IMPLANT
BLADE SURG 15 STRL LF DISP TIS (BLADE) ×2 IMPLANT
BLADE SURG 15 STRL SS (BLADE) ×2
BNDG CMPR 5X4 CHSV STRCH STRL (GAUZE/BANDAGES/DRESSINGS) ×1
BNDG COHESIVE 4X5 TAN STRL LF (GAUZE/BANDAGES/DRESSINGS) ×1 IMPLANT
BNDG ELASTIC 3X5.8 VLCR STR LF (GAUZE/BANDAGES/DRESSINGS) ×1 IMPLANT
BNDG ESMARCH 4 X 12 STRL LF (GAUZE/BANDAGES/DRESSINGS) ×1
BNDG ESMARCH 4X12 STRL LF (GAUZE/BANDAGES/DRESSINGS) ×1 IMPLANT
BNDG GZE 12X3 1 PLY HI ABS (GAUZE/BANDAGES/DRESSINGS) ×1
BNDG STRETCH GAUZE 3IN X12FT (GAUZE/BANDAGES/DRESSINGS) ×1 IMPLANT
BUR 4X55 1 (BURR) ×1 IMPLANT
CHLORAPREP W/TINT 26 (MISCELLANEOUS) ×1 IMPLANT
CUFF TOURN SGL QUICK 18X4 (TOURNIQUET CUFF) ×1 IMPLANT
DRAPE FLUOR MINI C-ARM 54X84 (DRAPES) ×1 IMPLANT
FORCEPS JEWEL BIP 4-3/4 STR (INSTRUMENTS) ×1 IMPLANT
GAUZE XEROFORM 1X8 LF (GAUZE/BANDAGES/DRESSINGS) ×1 IMPLANT
GLOVE BIO SURGEON STRL SZ8 (GLOVE) ×2 IMPLANT
GLOVE SURG UNDER LTX SZ8 (GLOVE) ×1 IMPLANT
GOWN STRL REUS W/ TWL XL LVL3 (GOWN DISPOSABLE) ×1 IMPLANT
GOWN STRL REUS W/TWL XL LVL3 (GOWN DISPOSABLE) ×1
KIT TURNOVER KIT A (KITS) ×1 IMPLANT
MANIFOLD NEPTUNE II (INSTRUMENTS) ×1 IMPLANT
NS IRRIG 500ML POUR BTL (IV SOLUTION) ×1 IMPLANT
PACK EXTREMITY ARMC (MISCELLANEOUS) ×1 IMPLANT
PAD CAST 3X4 CTTN HI CHSV (CAST SUPPLIES) ×2 IMPLANT
PADDING CAST COTTON 3X4 STRL (CAST SUPPLIES) ×2
PASSER SUT SWANSON 36MM LOOP (INSTRUMENTS) IMPLANT
SPLINT CAST 1 STEP 3X12 (MISCELLANEOUS) ×1 IMPLANT
STOCKINETTE 48X4 2 PLY STRL (GAUZE/BANDAGES/DRESSINGS) ×1 IMPLANT
STOCKINETTE IMPERVIOUS 9X36 MD (GAUZE/BANDAGES/DRESSINGS) ×1 IMPLANT
STOCKINETTE STRL 4IN 9604848 (GAUZE/BANDAGES/DRESSINGS) ×1 IMPLANT
STRIP CLOSURE SKIN 1/2X4 (GAUZE/BANDAGES/DRESSINGS) ×1 IMPLANT
SUT ETHIBOND 0 MO6 C/R (SUTURE) ×1 IMPLANT
SUT PROLENE 4 0 PS 2 18 (SUTURE) ×1 IMPLANT
SUT VIC AB 2-0 CT2 27 (SUTURE) ×1 IMPLANT
SUT VIC AB 2-0 SH 27 (SUTURE)
SUT VIC AB 2-0 SH 27XBRD (SUTURE) ×1 IMPLANT
SUT VIC AB 3-0 SH 27 (SUTURE) ×1
SUT VIC AB 3-0 SH 27X BRD (SUTURE) ×1 IMPLANT
SYSTEM IMPLANT TIGHTROPE MINI (Anchor) IMPLANT
TRAP FLUID SMOKE EVACUATOR (MISCELLANEOUS) ×1 IMPLANT
WATER STERILE IRR 500ML POUR (IV SOLUTION) ×1 IMPLANT
WIRE Z .062 C-WIRE SPADE TIP (WIRE) IMPLANT

## 2022-07-23 NOTE — Anesthesia Procedure Notes (Signed)
Procedure Name: LMA Insertion Date/Time: 07/23/2022 9:57 AM  Performed by: Adalberto Ill, CRNAPre-anesthesia Checklist: Emergency Drugs available, Suction available, Patient being monitored, Timeout performed and Patient identified Patient Re-evaluated:Patient Re-evaluated prior to induction Oxygen Delivery Method: Circle system utilized Preoxygenation: Pre-oxygenation with 100% oxygen Induction Type: IV induction Ventilation: Mask ventilation without difficulty LMA: LMA inserted LMA Size: 4.0 Tube type: Oral Number of attempts: 1 Placement Confirmation: positive ETCO2 and breath sounds checked- equal and bilateral ETT to lip (cm): yes. Tube secured with: Tape Dental Injury: Teeth and Oropharynx as per pre-operative assessment  Comments: Brief atraumatic dentition unchanged

## 2022-07-23 NOTE — Op Note (Signed)
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  Post-Op Diagnosis:   Same.  Procedure:   Suspension arthroplasty right thumb CMC joint.   Surgeon:   Pascal Lux, MD  Assistant:   None  Anesthesia:   General LMA  Findings:   As above.  Complications:   None  EBL:   0 cc  Fluids:   500 cc crystalloid  TT:   75 minutes at 250 mmHg  Drains:   None  Closure:   3-0 Vicryl subcuticular sutures, 4-0 Prolene interrupted sutures  Implants:   Arthrex 1.1 mm CMC Mini Tightrope.  Brief Clinical Note:   The patient is a 77 year old female with a history of progressively worsening basilar right thumb pain. The patient's symptoms have progressed despite medications, activity modification, injections, splinting, etc. The patient's history and examination are consistent with advanced degenerative joint disease of the right thumb CMC joint which was confirmed by plain radiographs. The patient presents at this time for a suspension arthroplasty of the right thumb CMC joint.  Procedure:    The patient was brought into the operating room and lain in the supine position. After adequate general laryngeal mask anesthesia was obtained, the patient's right hand and upper extremity were prepped with ChloraPrep solution before being draped sterilely. Preoperative antibiotics were administered. A timeout was performed to verify the appropriate surgical site before the limb was exsanguinated with an Esmarch and the tourniquet inflated to 250 mmHg.   An approximately 3-4 cm longitudinal incision was made centered over the radial aspect of the thumb CMC joint. The incision was carried down through the subcutaneous cutaneous tissues with care taken to identify and protect the local neurovascular structures. Several small veins were cauterized with bipolar electrocautery. A longitudinal incision was made through the capsular tissues  over the dorsal aspect of the thumb CMC joint. Subperiosteal dissection was carried out to expose the trapezium dorsally and volarly. Once the [facility] and scaphotrapezial joints were identified, a sagittal cut was made in the trapezium using the micro-oscillating saw. This cut extended approximately two-thirds down through the bone. A Hoke osteotome was used to complete transection of the bone. The bone was then removed piecemeal using rongeurs and further dissection. The flexor carpi radialis tendon was identified at the base of the defect. The base of the thumb metacarpal was prepared by exposing its radial base.  The Arthrex 1.1 mm guidewire was drilled up through the proximal end of the thumb metacarpal beginning at the volo-radial surface and advancing into the volar radial aspect of the proximal index metacarpal shaft. The adequacy of pin position was verified using FluoroScan imaging in AP, lateral, and oblique projections and found to be excellent.  An approximate 1.5 cm incision was made over the dorsal aspect of the proximal end of the index metacarpal. The incision was carried down through the subcutaneous tissues with care taken to avoid damaging the extensor tendon. The interosseous muscle was dissected away from the dorso-ulnar aspect of the proximal index metacarpal shaft before the guidewire was advanced through the final cortex into this wound. The suture was passed through the nitinol wire loop and pulled across the thumb and index metacarpals, seating the mini-Endobutton against the proximal end of the thumb metacarpal. The suture was cut and each and passed through the second mini-Endobutton. The Endobutton was advanced to rest against the dorso-ulnar aspect of the proximal index metacarpal  shaft before it was tied securely with appropriate tension to stabilize the base of the thumb metacarpal against the base of the index metacarpal. Again, the adequacy of Endobutton position and metacarpal  reduction was verified fluoroscopically in AP, lateral, and oblique projections and found to be excellent. The thumb was then placed through a range of motion. It was able to be abducted and adducted fully and remained distracted to gentle axial loading.  The wounds were copiously irrigated with sterile saline solution using bulb irrigation. The thumb CMC capsular tissues were reapproximated using 2-0 Vicryl interrupted sutures before the skin was closed using 3-0 Vicryl subcuticular interrupted sutures. Benzoin and Steri-Strips were applied to the skin. The skin of the more dorsal incision was closed using 4-0 Prolene interrupted sutures. A total of 10 cc of 0.5% plain Sensorcaine was injected in and around both incisions to help with postoperative analgesia. A sterile bulky dressing was applied to the wounds before the patient was placed into a fiberglass thumb spica splint maintaining the thumb in the position of function. The patient was then awakened, extubated, and returned to the recovery room in satisfactory condition after tolerating the procedure well.

## 2022-07-23 NOTE — Transfer of Care (Signed)
Immediate Anesthesia Transfer of Care Note  Patient: Alyssa Lawson  Procedure(s) Performed: RIGHT THUMB CMC SUSPENSION ARTHROPLASTY (Right: Thumb)  Patient Location: PACU  Anesthesia Type:General  Level of Consciousness: awake, alert , and oriented  Airway & Oxygen Therapy: Patient Spontanous Breathing and Patient connected to face mask oxygen  Post-op Assessment: Report given to RN and Post -op Vital signs reviewed and stable  Post vital signs: Reviewed and stable  Last Vitals:  Vitals Value Taken Time  BP 123/64 07/23/22 1136  Temp    Pulse 70 07/23/22 1138  Resp 19 07/23/22 1138  SpO2 99 % 07/23/22 1138  Vitals shown include unvalidated device data.  Last Pain:  Vitals:   07/23/22 0924  TempSrc: Oral  PainSc: 0-No pain         Complications: No notable events documented.

## 2022-07-23 NOTE — H&P (Signed)
History of Present Illness:  Alyssa Lawson is a 77 y.o. female who presents for a history and physical for an upcoming right thumb CMC suspension arthroplasty to be done by Dr. Roland Rack on July 23, 2022. The patient has seen Dr. Roland Rack for evaluation and treatment of her basilar right thumb pain secondary to degenerative joint disease. The patient was treated by myself. She received a steroid injection which she states provided temporary partial relief of her symptoms. This this injection provided very little relief of her symptoms, so she has been referred to me for further evaluation and treatment. The patient notes that her symptoms are primarily in the basilar aspect of her right thumb and are worse with any repetitive grasping or holding activities. She also notes occasional mild paresthesias to the tip of her thumb. She denies any specific injury to her thumb, although she notes that the symptoms developed after picking up multiple river rocks 2 summers ago. She is right-hand dominant.The patient takes an aspirin a day. She has a history of atrial tachycardia with a history of a murmur. The patient is a diabetic.  Current Outpatient Medications: ascorbic acid, vitamin C, (VITAMIN C) 500 MG tablet Take by mouth  atorvastatin (LIPITOR) 40 MG tablet Take 1 tablet by mouth once daily  busPIRone (BUSPAR) 5 MG tablet Take 5 mg by mouth 2 (two) times daily  carvediloL (COREG) 25 MG tablet Take by mouth  celecoxib (CELEBREX) 100 MG capsule Take 1 capsule (100 mg total) by mouth once daily for 120 days 30 capsule 3  cholecalciferol (VITAMIN D3) 2,000 unit capsule Take 1 capsule by mouth once daily  cloNIDine HCL (CATAPRES) 0.1 MG tablet Take by mouth  cyanocobalamin 2000 MCG tablet Take by mouth  ferrous sulfate 325 (65 FE) MG tablet Take 325 mg by mouth daily with breakfast  fluticasone propionate (FLONASE) 50 mcg/actuation nasal spray USE 1 SPRAY IN EACH NOSTRIL TWO TIMES DAILY  hydrALAZINE  (APRESOLINE) 25 MG tablet Take 25 mg by mouth 3 (three) times daily  ibuprofen (MOTRIN) 200 MG tablet Take 200 mg by mouth every 6 (six) hours as needed for Pain  isosorbide mononitrate (IMDUR) 30 MG ER tablet Take 1 tablet by mouth once daily  multivitamin capsule Take 1 capsule by mouth once daily  olmesartan (BENICAR) 40 MG tablet Take 40 mg by mouth once daily  oxybutynin (DITROPAN) 5 mg tablet Take 5 mg by mouth 2 (two) times daily  pramipexole (MIRAPEX) 0.5 MG tablet Take 0.5 mg by mouth at bedtime  tiZANidine (ZANAFLEX) 2 MG tablet Take by mouth  vitamin B complex TbER Take by mouth  diclofenac (VOLTAREN) 1 % topical gel APPLY 2 GRAMS TO THE AFFECTED AREA(S) BY TOPICAL ROUTE 4 TIMES PER DAY (Patient not taking: Reported on 07/14/2022)  gabapentin (NEURONTIN) 300 MG capsule Take 1 capsule (300 mg total) by mouth nightly for 60 doses Increase to 2 at night time after 1 week, if no improvement. 60 capsule 1  predniSONE (DELTASONE) 10 MG tablet 6,5,4,3,2,1 tablet tapering dose (Patient not taking: Reported on 05/30/2022) 21 tablet 0  rOPINIRole (REQUIP) 2 MG tablet Take by mouth (Patient not taking: Reported on 05/30/2022)  traMADoL (ULTRAM) 50 mg tablet Take 1 tablet (50 mg total) by mouth every 6 (six) hours as needed for Pain for up to 30 doses (Patient not taking: Reported on 05/30/2022) 30 tablet 0   No current Epic-ordered facility-administered medications on file.   Allergies:  Amlodipine Swelling  Meloxicam Abdominal Pain and  Palpitations   Past Medical History:  Abnormal EKG 04/24/2017  Atrial tachycardia 08/25/2017  Benign essential HTN 12/17/2014  Cataracts, bilateral 12/17/2014  Chronic bilateral low back pain without sciatica 08/20/2016  Dyslipidemia 12/17/2014  Gastric reflux 12/17/2014  H/O iron deficiency anemia 12/17/2014  History of cervical cancer 12/17/2014  History of hysterectomy 6/81/2751  Metabolic syndrome 7/00/1749  Mixed hyperlipidemia 11/09/2018  Osteopenia of the  elderly 12/17/2014  Formatting of this note might be different from the original. with high FRAX of 3.9% for hip, needs to return to discuss medication  Positive cardiac stress test 05/01/2017  Restless leg syndrome 12/21/2014  Rotator cuff tendinitis, left 04/10/2017  Smoker 11/09/2018  Smoking history 04/24/2017  TI (tricuspid incompetence) 12/17/2014  Type 2 diabetes mellitus with complication, with long-term current use of insulin (CMS-HCC) 11/09/2018  Unstable angina (CMS-HCC) 05/01/2017  Urinary incontinence in female 12/17/2014  Formatting of this note might be different from the original. stress and urge   Past Surgical History:  HYSTERECTOMY   Past Family History: No pertinent family history.   Social History:   Socioeconomic History:  Marital status: Married  Tobacco Use  Smoking status: Former  Types: Cigarettes  Quit date: 1998  Years since quitting: 26.0  Vaping Use: Never used   Review of Systems:  A comprehensive 14 point ROS was performed, reviewed, and the pertinent orthopaedic findings are documented in the HPI.  Physical Exam: Vitals:  07/14/22 1036  BP: (!) 186/98  Weight: 70 kg (154 lb 6.4 oz)  Height: 162.6 cm ('5\' 4"'$ )  PainSc: 7  PainLoc: Finger   General/Constitutional: The patient appears to be well-nourished, well-developed, and in no acute distress. Neuro/Psych: Normal mood and affect, oriented to person, place and time. Eyes: Non-icteric. Pupils are equal, round, and reactive to light, and exhibit synchronous movement. ENT: Unremarkable. Lymphatic: No palpable adenopathy. Respiratory: Lungs clear to auscultation, Normal chest excursion, No wheezes, and Non-labored breathing Cardiovascular: Regular rate and rhythm. No murmurs. and No edema, swelling or tenderness, except as noted in detailed exam. Integumentary: No impressive skin lesions present, except as noted in detailed exam. Musculoskeletal: Unremarkable, except as noted in detailed  exam.  Heart: Examination of the heart reveals regular, rate, and rhythm. Patient has a 2/6 systolic ejection murmur in the right sternal border noted on ascultation. There is a normal apical pulse.  Lungs: Lungs are clear to auscultation. There is no wheeze, rhonchi, or crackles. There is normal expansion of bilateral chest walls.   Right hand exam: Skin inspection of the right hand is unremarkable. No swelling, erythema, ecchymosis, abrasions, or other skin abnormalities are identified. She experiences mild-moderate tenderness to palpation over the dorso-ulnar aspect of her right thumb CMC joint, but there are no other areas of tenderness around the wrist, thumb, or hand. She has a mildly positive grind test. She is able to actively flex and extend all digits fully without any pain or triggering. She is neurovascularly intact to all digits.   X-rays/MRI/Lab data:  AP, lateral, and oblique views of the right thumb are obtained. These films demonstrate moderate degenerative changes of the right thumb CMC joint as manifest by joint space narrowing and osteophyte formation. There is mild radial subluxation of the base of the metacarpal in relation to the trapezium. No fractures or lytic lesions are identified.  Radiology: Xrays of the left hand were ordered and interpreted 07/14/2022, with 3 views using AP, lateral, oblique views. Xrays revealed the left hand with no fracture or dislocation. There is  no translucency noted. There is no degenerative changes seen.   Assessment: 1. Primary osteoarthritis of first carpometacarpal joint of right hand. 2. Type 2 diabetes mellitus with complication, with long-term current use of insulin.   Plan: The treatment options were discussed with the patient. In addition, patient educational materials were provided regarding the diagnosis and treatment options. The patient is quite frustrated by her symptoms and function limitations, and is ready to consider more  aggressive treatment options. Therefore, I have recommended a surgical procedure, specifically a suspension arthroplasty of the right thumb CMC joint. The procedure was discussed with the patient, as were the potential risks (including bleeding, infection, nerve and/or blood vessel injury, persistent or recurrent pain, loosening and/or failure of the components, need for further surgery, blood clots, strokes, heart attacks and/or arhythmias, pneumonia, etc.) and benefits. The patient states her understanding and wishes to proceed. All of the patient's questions and concerns were answered. She can call any time with further concerns. She will follow up post-surgery, routine.    H&P reviewed and patient re-examined. No changes.

## 2022-07-23 NOTE — Anesthesia Preprocedure Evaluation (Addendum)
Anesthesia Evaluation  Patient identified by MRN, date of birth, ID band Patient awake    Reviewed: Allergy & Precautions, NPO status , Patient's Chart, lab work & pertinent test results  Airway Mallampati: II  TM Distance: >3 FB Neck ROM: full    Dental  (+) Teeth Intact   Pulmonary neg pulmonary ROS, Patient abstained from smoking., former smoker   Pulmonary exam normal breath sounds clear to auscultation       Cardiovascular Exercise Tolerance: Good hypertension, Pt. on medications + CAD, + Peripheral Vascular Disease and +CHF  negative cardio ROS Normal cardiovascular exam Rhythm:Regular Rate:Normal     Neuro/Psych negative neurological ROS  negative psych ROS   GI/Hepatic negative GI ROS, Neg liver ROS,GERD  ,,  Endo/Other  negative endocrine ROS    Renal/GU   negative genitourinary   Musculoskeletal  (+) Arthritis ,    Abdominal Normal abdominal exam  (+)   Peds negative pediatric ROS (+)  Hematology negative hematology ROS (+) Blood dyscrasia, anemia   Anesthesia Other Findings Past Medical History: No date: Allergy No date: Anemia 12/04/2021: Anxiety No date: Arthritis No date: Atrial tachycardia No date: Caregiver burden     Comment:  taking care of husband with dementia No date: Cervical cancer ([place]) No date: CHF (congestive heart failure) (HCC) No date: Chronic lower back pain No date: Coronary artery disease, non-occlusive     Comment:  a. LHC 05/01/2017: LM no sig dz, LAD no sig dz, LCx no               sig dz, RCA no sig dz, EF 55-65%, mod MR No date: DDD (degenerative disc disease), cervical No date: Diet-controlled type 2 diabetes mellitus (HCC) No date: Endometriosis No date: GERD (gastroesophageal reflux disease) No date: History of echocardiogram     Comment:  a.) TTE 05/01/2017: EF 55-60%, no RWMA, mild-mod MR,               mildly dilated LA, RVSF norm, PASP 35 mmHg, sm-mod                pericardial effusion No date: Hyperlipidemia No date: Hypertension No date: Restless leg syndrome     Comment:  a.) on pramipexole No date: Unstable angina (HCC) No date: Urinary incontinence  Past Surgical History: 1976: ABDOMINAL HYSTERECTOMY No date: CARDIAC CATHETERIZATION 05/01/2017: LEFT HEART CATH AND CORONARY ANGIOGRAPHY; N/A     Comment:  Procedure: LEFT HEART CATH AND CORONARY ANGIOGRAPHY;                Surgeon: Minna Merritts, MD;  Location: [facility]               CV LAB;  Service: Cardiovascular;  Laterality: N/A; No date: OOPHORECTOMY     Reproductive/Obstetrics negative OB ROS                             Anesthesia Physical Anesthesia Plan  ASA: 3  Anesthesia Plan: General   Post-op Pain Management:    Induction: Intravenous  PONV Risk Score and Plan: 1 and Ondansetron and Dexamethasone  Airway Management Planned: LMA  Additional Equipment:   Intra-op Plan:   Post-operative Plan: Extubation in OR  Informed Consent: I have reviewed the patients History and Physical, chart, labs and discussed the procedure including the risks, benefits and alternatives for the proposed anesthesia with the patient or authorized representative who has indicated his/her understanding and  acceptance.     Dental Advisory Given  Plan Discussed with: CRNA and Surgeon  Anesthesia Plan Comments:        Anesthesia Quick Evaluation

## 2022-07-23 NOTE — Discharge Instructions (Addendum)
Orthopedic discharge instructions: Keep splint dry and intact. Keep hand elevated above heart level. Apply ice to affected area frequently. Take Celebrex 200 mg daily OR ibuprofen 600-800 mg TID with meals for 3-5 days, then as necessary. Take pain medication as prescribed or ES Tylenol when needed.  Return for follow-up in 10-14 days or as scheduled.  AMBULATORY SURGERY  DISCHARGE INSTRUCTIONS   The drugs that you were given will stay in your system until tomorrow so for the next 24 hours you should not:  Drive an automobile Make any legal decisions Drink any alcoholic beverage   You may resume regular meals tomorrow.  Today it is better to start with liquids and gradually work up to solid foods.  You may eat anything you prefer, but it is better to start with liquids, then soup and crackers, and gradually work up to solid foods.   Please notify your doctor immediately if you have any unusual bleeding, trouble breathing, redness and pain at the surgery site, drainage, fever, or pain not relieved by medication.    Additional Instructions:   Please contact your physician with any problems or Same Day Surgery at [phone], Monday through Friday 6 am to 4 pm, or [place] at [facility] number at [phone].

## 2022-07-23 NOTE — Anesthesia Postprocedure Evaluation (Signed)
Anesthesia Post Note  Patient: Alyssa Lawson  Procedure(s) Performed: RIGHT THUMB [facility]MC SUSPENSION ARTHROPLASTY (Right: Thumb)  Patient location during evaluation: PACU Anesthesia Type: General Level of consciousness: awake and awake and alert Pain management: satisfactory to patient Vital Signs Assessment: post-procedure vital signs reviewed and stable Respiratory status: spontaneous breathing Cardiovascular status: stable Anesthetic complications: no  No notable events documented.   Last Vitals:  Vitals:   07/23/22 1200 07/23/22 1229  BP: (!) 157/78 (!) 160/84  Pulse: 73 89  Resp: (!) 26 20  Temp:  (!) 36.1 C  SpO2: 94% 94%    Last Pain:  Vitals:   07/23/22 1229  TempSrc: Temporal  PainSc: 0-No pain                 VAN STAVEREN,Quame Spratlin

## 2022-08-04 ENCOUNTER — Other Ambulatory Visit: Payer: Self-pay | Admitting: Cardiovascular Disease

## 2022-08-04 DIAGNOSIS — E785 Hyperlipidemia, unspecified: Secondary | ICD-10-CM

## 2022-08-04 DIAGNOSIS — M1811 Unilateral primary osteoarthritis of first carpometacarpal joint, right hand: Secondary | ICD-10-CM | POA: Diagnosis not present

## 2022-08-05 NOTE — Telephone Encounter (Signed)
Please advise if ok to refill last filled by Milagros Evener, MD.

## 2022-08-05 NOTE — Telephone Encounter (Signed)
Refill Request.  

## 2022-08-05 NOTE — Telephone Encounter (Signed)
Lipids most recently checked by PCP, Dr. Ancil Boozer,  in 04/2022. Would forward to PCP for refill authorization.

## 2022-08-26 ENCOUNTER — Other Ambulatory Visit: Payer: Self-pay | Admitting: Cardiovascular Disease

## 2022-08-26 DIAGNOSIS — I10 Essential (primary) hypertension: Secondary | ICD-10-CM

## 2022-09-03 DIAGNOSIS — M1811 Unilateral primary osteoarthritis of first carpometacarpal joint, right hand: Secondary | ICD-10-CM | POA: Diagnosis not present

## 2022-09-04 ENCOUNTER — Other Ambulatory Visit: Payer: Self-pay | Admitting: Family Medicine

## 2022-09-18 NOTE — Progress Notes (Deleted)
Cardiology Office Note:    Date:  09/18/2022   ID:  Alyssa Lawson, DOB 1945-10-21, MRN [id number]  PCP:  Steele Sizer, MD   [facility] Providers Cardiologist:  None { Click to update primary MD,subspecialty MD or APP then REFRESH:1}    Referring MD: Steele Sizer, MD   No chief complaint on file. ***  History of Present Illness:    Alyssa Lawson is a 77 y.o. female with a hx of HTN, non-obstructive CAD (no significant disease noted LHC 2018), atrial tachycardia, MR, tricuspid incompetence, DM2 on insulin, HLD, DDD, GERD, RLS, h/o cervical cancer, IDA, anxiety, former smoker.   She established care with Dr. [name] in 2018 at the behest of her PCP for evaluation chest pain and abnormal EKG. She underwent a myocardial perfusion study on 04/30/17 which showed large region on ischemia in the mid to apical anteroseptal wall and entire lateral wall. A LHC was completed on 05/01/17 which showed no significant CAD, false + stress test. She did have atrial tachycardia after the case and was treated with metoprolol IV. Echo the same day revealed an EF of 55-60%, no RWMA, mild > mod MR.   She was most recently evaluated by Dr. [name] on 09/13/21 and was doing well from a cardiac perspective at that time. She endorsed a high level of stress related to caring for her husband who suffered from dementia. Her cholesterol was at goal and her BP was well managed.   Past Medical History:  Diagnosis Date   Allergy    Anemia    Anxiety 12/04/2021   Arthritis    Atrial tachycardia    Caregiver burden    taking care of husband with dementia   Cervical cancer ([place])    CHF (congestive heart failure) (HCC)    Chronic lower back pain    Coronary artery disease, non-occlusive    a. LHC 05/01/2017: LM no sig dz, LAD no sig dz, LCx no sig dz, RCA no sig dz, EF 55-65%, mod MR   DDD (degenerative disc disease), cervical    Diet-controlled type 2 diabetes mellitus (HCC)    Endometriosis    GERD  (gastroesophageal reflux disease)    History of echocardiogram    a.) TTE 05/01/2017: EF 55-60%, no RWMA, mild-mod MR, mildly dilated LA, RVSF norm, PASP 35 mmHg, sm-mod pericardial effusion   Hyperlipidemia    Hypertension    Restless leg syndrome    a.) on pramipexole   Unstable angina (HCC)    Urinary incontinence     Past Surgical History:  Procedure Laterality Date   ABDOMINAL HYSTERECTOMY  1976   CARDIAC CATHETERIZATION     CARPOMETACARPAL ([place]) FUSION OF THUMB Right 07/23/2022   Procedure: RIGHT THUMB [place] SUSPENSION ARTHROPLASTY;  Surgeon: Corky Mull, MD;  Location: [facility] ORS;  Service: Orthopedics;  Laterality: Right;   LEFT HEART CATH AND CORONARY ANGIOGRAPHY N/A 05/01/2017   Procedure: LEFT HEART CATH AND CORONARY ANGIOGRAPHY;  Surgeon: Minna Merritts, MD;  Location: [facility] CV LAB;  Service: Cardiovascular;  Laterality: N/A;   OOPHORECTOMY      Current Medications: No outpatient medications have been marked as taking for the 09/19/22 encounter (Appointment) with Theora Gianotti, NP.     Allergies:   Amlodipine and Meloxicam   Social History   Socioeconomic History   Marital status: Married    Spouse name: Doren Custard   Number of children: 1   Years of education: some college   Highest  education level: 12th grade  Occupational History   Occupation: Retired  Tobacco Use   Smoking status: Former    Packs/day: 0.50    Years: 18.00    Additional pack years: 0.00    Total pack years: 9.00    Types: Cigarettes    Start date: 06/23/1956    Quit date: 11/22/1995    Years since quitting: 26.8   Smokeless tobacco: Former    Quit date: 11/22/1996   Tobacco comments:    None  Vaping Use   Vaping Use: Never used  Substance and Sexual Activity   Alcohol use: Yes    Alcohol/week: 1.0 standard drink of alcohol    Types: 1 Glasses of wine per week    Comment: glass of wine occasionally maybe once a month   Drug use: No   Sexual activity: Yes    Partners:  Male  Other Topics Concern   Not on file  Social History Narrative   She stopped working Spring 2018, 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 [employer] - she retired because of stress   Social Determinants of Health   Financial Resource Strain: [place]  (04/23/2021)   Overall Financial Resource Strain (CARDIA)    Difficulty of Paying Living Expenses: Not hard at all  Food Insecurity: No Food Insecurity (04/23/2021)   Hunger Vital Sign    Worried About Running Out of Food in the Last Year: Never true    [place] in the Last Year: Never true  Transportation Needs: No Transportation Needs (04/23/2021)   PRAPARE - Hydrologist (Medical): No    Lack of Transportation (Non-Medical): No  Physical Activity: Sufficiently Active (04/23/2021)   Exercise Vital Sign    Days of Exercise per Week: 5 days    Minutes of Exercise per Session: 120 min  Stress: No Stress Concern Present (04/23/2021)   [place]    Feeling of Stress : Not at all  Social Connections: [place] (04/23/2021)   Social Connection and Isolation Panel [NHANES]    Frequency of Communication with Friends and Family: More than three times a week    Frequency of Social Gatherings with Friends and Family: More than three times a week    Attends Religious Services: More than 4 times per year    Active Member of [employer] or Organizations: Yes    Attends Music therapist: More than 4 times per year    Marital Status: Married     Family History: The patient's ***family history includes Arrhythmia in her father and sister; Breast cancer (age of onset: 26) in her paternal aunt; Cardiomyopathy in her sister; Congestive Heart Failure in her brother; Diabetes in her brother; Heart failure in her father; Hypertension in her brother, father, and sister.  ROS:   Please see the history of present  illness.    *** All other systems reviewed and are negative.  EKGs/Labs/Other Studies Reviewed:    The following studies were reviewed today:   05/01/17 LHC -  Coronary angiography:  Coronary dominance: Right  Left mainstem: Large vessel that bifurcates into the LAD and left circumflex, no significant disease noted  Left anterior descending (LAD): Large vessel that extends to the apical region, diagonal branch 2 of moderate size, no significant disease noted  Left circumflex (LCx): Large vessel with OM branch 2, no significant disease noted  Right coronary artery (RCA):  Right dominant vessel with PL and PDA, no significant disease noted  Left ventriculography: Left ventricular systolic function is normal, LVEF is estimated at 55-65%, moderate MR in setting of ectopy, no significant aortic valve stenosis Atrial tachycardia noted after the case was complete   EKG:  EKG is *** ordered today.  The ekg ordered today demonstrates ***  Recent Labs: 05/07/2022: ALT 21; BUN 15; Creat 0.89; Hemoglobin 13.5; Platelets 184; Potassium 4.0; Sodium 142  Recent Lipid Panel    Component Value Date/Time   CHOL 132 05/07/2022 1524   CHOL 225 (H) 06/22/2015 1232   TRIG 53 05/07/2022 1524   HDL 65 05/07/2022 1524   HDL 76 06/22/2015 1232   CHOLHDL 2.0 05/07/2022 1524   VLDL 14 08/29/2016 0822   LDLCALC 54 05/07/2022 1524     Risk Assessment/Calculations:   {Does this patient have ATRIAL FIBRILLATION?:[id number]}  No BP recorded.  {Refresh Note OR Click here to enter BP  :1}***         Physical Exam:    VS:  There were no vitals taken for this visit.    Wt Readings from Last 3 Encounters:  07/23/22 154 lb 5.2 oz (70 kg)  07/16/22 154 lb 5.2 oz (70 kg)  05/07/22 154 lb 9.6 oz (70.1 kg)     GEN: *** Well nourished, well developed in no acute distress HEENT: Normal NECK: No JVD; No carotid bruits LYMPHATICS: No lymphadenopathy CARDIAC: ***RRR, no murmurs, rubs,  gallops RESPIRATORY:  Clear to auscultation without rales, wheezing or rhonchi  ABDOMEN: Soft, non-tender, non-distended MUSCULOSKELETAL:  No edema; No deformity  SKIN: Warm and dry NEUROLOGIC:  Alert and oriented x 3 PSYCHIATRIC:  Normal affect   ASSESSMENT:    1. Coronary artery disease involving native coronary artery of native heart without angina pectoris   2. Mitral valve insufficiency, unspecified etiology   3. Essential hypertension   4. Mixed hyperlipidemia    PLAN:    In order of problems listed above:  CAD - false positive stress test in 2018 > LHC showed non-obstructive CAD.  Mitral regurgitation - noted on echo in 2018 HTN HLD - LDL 54 on 05/07/22, currently well controlled.       {Are you ordering a CV Procedure (e.g. stress test, cath, DCCV, TEE, etc)?   Press F2        :[id number]    Medication Adjustments/Labs and Tests Ordered: Current medicines are reviewed at length with the patient today.  Concerns regarding medicines are outlined above.  No orders of the defined types were placed in this encounter.  No orders of the defined types were placed in this encounter.   There are no Patient Instructions on file for this visit.   Signed, Trudi Ida, NP  09/18/2022 7:11 PM    [place]

## 2022-09-19 ENCOUNTER — Ambulatory Visit: Payer: Medicare Other | Admitting: Nurse Practitioner

## 2022-09-19 DIAGNOSIS — I2510 Atherosclerotic heart disease of native coronary artery without angina pectoris: Secondary | ICD-10-CM

## 2022-09-19 DIAGNOSIS — I10 Essential (primary) hypertension: Secondary | ICD-10-CM

## 2022-09-19 DIAGNOSIS — E782 Mixed hyperlipidemia: Secondary | ICD-10-CM

## 2022-09-19 DIAGNOSIS — I340 Nonrheumatic mitral (valve) insufficiency: Secondary | ICD-10-CM

## 2022-10-03 ENCOUNTER — Other Ambulatory Visit: Payer: Self-pay | Admitting: Family Medicine

## 2022-10-03 DIAGNOSIS — E785 Hyperlipidemia, unspecified: Secondary | ICD-10-CM

## 2022-10-03 NOTE — Progress Notes (Deleted)
Name: Alyssa Lawson   MRN: [id number]    DOB: [birth year]   Date:10/03/2022       Progress Note  Subjective  Chief Complaint  Follow Up  HPI  HTN: taking coreg  daily, Benicar 40 , hydralazine 25 mg TID ( down from 50 QID )  she is not sure if he is taking Clonidine at night.  BP is controlled.  BP is at goal She denies headaches of palpitation   Hyperlipidemia: last lipid panel was at goal, LDL was 54 . She is taking atorvastatin and denies muscle aches.   DMII: diagnosed with diabetes years ago, since 2015  She stopped taking metformin due to pt c/o swelling and weight gain with medication. Pt was started Trulicity back in 12/2016 but stopped because of cost. She is on diet only at this time, A1C went from to 6 % and now is  6.2 %    Urinary incontinence: she has been on Ditropan , tolerating medication well, nocturia has resolved with medication   Senile purpura: stable and reassurance given Stable    RLS: better with medication, she was on Requip and we switched to Mirapex and she states she has been able to sleep well    Chronic Back Pain: she has daily low back pain, She takes Tylenol and Celebrex daily, very seldom takes Tizanidine now    Unstable angina: indigestion symptoms with abnormal EKG, normal cardiac cath done Nov 2018 by Dr. Mariah Milling, on higher dose of Coreg, Benicar , statin and Imdur  therefore symptoms controlled with medication management She also takes an aspirin daily   Atrial tachycardia: no recent episodes of palpitation , she is on beta blocker and is rate controlled .Unchanged  Stress/Anxiety : she states her husband has dementia, is paranoid, blames her for everything , he is now voiding all over the house, she cannot afford a long term care facility, she is paying for a lady to come to her house 3 days a week , she baths him, gives him breakfast and lunch and is working well.   Cervical radiculitis: she went to PT at [facility], currently only taking  tizanidine prn she states tingling now only on right thumb  She states gabapentin did not work, we gave her lyrica but she it no longer taking it and symptoms have improved   Hand pain: both hands, proximal interphalangeal joints getting swollen int he evening - at the end of the day-  and stiff but not for a long time, right hand is worse than left . She is seeing Ortho at [facility] and will have surgery on right hand done by Dr. Joice Lofts after the holidays, she is now taking Celebrex 100 mg daily and states pain on hands, back, neck have improved significantly   Patient Active Problem List   Diagnosis Date Noted   Primary osteoarthritis of both hands 12/04/2021   Primary osteoarthritis of first carpometacarpal joint of right hand 12/04/2021   Urge incontinence 12/04/2021   Anxiety 12/04/2021   Dyslipidemia associated with type 2 diabetes mellitus 12/04/2021   Senile purpura 04/05/2021   Cervical radiculitis 04/05/2021   DDD (degenerative disc disease), cervical 04/05/2021   Chronic pain of right thumb 04/05/2021   Type 2 diabetes mellitus with complication, with long-term current use of insulin 11/09/2018   Atrial tachycardia 08/25/2017   Unstable angina 05/01/2017   Abnormal EKG 04/24/2017   Smoking history 04/24/2017   Rotator cuff tendinitis, left 04/10/2017   Cataracts, bilateral  11/25/2016   Impingement syndrome of shoulder, left 10/13/2016   Chronic bilateral low back pain without sciatica 08/20/2016   Mitral regurgitation 06/22/2015   Melasma 12/21/2014   History of hysterectomy 12/21/2014   RLS (restless legs syndrome) 12/21/2014   Benign essential HTN 12/17/2014   Cataract 12/17/2014   Dyslipidemia 12/17/2014   Gastric reflux 12/17/2014   History of cervical cancer 12/17/2014   H/O iron deficiency anemia 12/17/2014   TI (tricuspid incompetence) 12/17/2014   Osteopenia of the elderly 12/17/2014    Past Surgical History:  Procedure Laterality Date   ABDOMINAL  HYSTERECTOMY  1976   CARDIAC CATHETERIZATION     CARPOMETACARPAL ([facility]) FUSION OF THUMB Right 07/23/2022   Procedure: RIGHT THUMB [facility] SUSPENSION ARTHROPLASTY;  Surgeon: Christena Flake, MD;  Location: [facility] ORS;  Service: Orthopedics;  Laterality: Right;   LEFT HEART CATH AND CORONARY ANGIOGRAPHY N/A 05/01/2017   Procedure: LEFT HEART CATH AND CORONARY ANGIOGRAPHY;  Surgeon: Antonieta Iba, MD;  Location: [facility] INVASIVE CV LAB;  Service: Cardiovascular;  Laterality: N/A;   OOPHORECTOMY      Family History  Problem Relation Age of Onset   Heart failure Father    Arrhythmia Father    Hypertension Father    Arrhythmia Sister    Hypertension Sister    Congestive Heart Failure Brother    Hypertension Brother    Cardiomyopathy Sister    Diabetes Brother    Breast cancer Paternal Aunt 69    Social History   Tobacco Use   Smoking status: Former    Packs/day: 0.50    Years: 18.00    Additional pack years: 0.00    Total pack years: 9.00    Types: Cigarettes    Start date: 06/23/1956    Quit date: 11/22/1995    Years since quitting: 26.8   Smokeless tobacco: Former    Quit date: 11/22/1996   Tobacco comments:    None  Substance Use Topics   Alcohol use: Yes    Alcohol/week: 1.0 standard drink of alcohol    Types: 1 Glasses of wine per week    Comment: glass of wine occasionally maybe once a month     Current Outpatient Medications:    acetaminophen (TYLENOL) 500 MG tablet, Take 1 tablet (500 mg total) by mouth every 6 (six) hours as needed for headache., Disp: 30 tablet, Rfl: 0   aspirin 81 MG chewable tablet, Chew 1 tablet by mouth at bedtime., Disp: , Rfl:    atorvastatin (LIPITOR) 40 MG tablet, TAKE 1 TABLET BY MOUTH DAILY, Disp: 90 tablet, Rfl: 0   busPIRone (BUSPAR) 5 MG tablet, TAKE 1 TABLET(5 MG) BY MOUTH TWICE DAILY AS NEEDED, Disp: 180 tablet, Rfl: 1   carvedilol (COREG) 25 MG tablet, TAKE 1 TABLET BY MOUTH TWICE  DAILY WITH MEALS, Disp: 200 tablet, Rfl: 2   celecoxib  (CELEBREX) 100 MG capsule, Take 100 mg by mouth every evening., Disp: , Rfl:    Cholecalciferol (VITAMIN D) 50 MCG (2000 UT) CAPS, Take 1 capsule by mouth daily., Disp: , Rfl:    cloNIDine (CATAPRES) 0.1 MG tablet, TAKE 1 TABLET BY MOUTH IN THE  EVENING, Disp: 100 tablet, Rfl: 1   Cyanocobalamin (VITAMIN B-12) 2000 MCG TBCR, Take 1 tablet by mouth daily., Disp: , Rfl:    ferrous sulfate 325 (65 FE) MG tablet, Take 325 mg by mouth daily with breakfast., Disp: , Rfl:    hydrALAZINE (APRESOLINE) 25 MG tablet, TAKE 1 TABLET BY MOUTH 3  TIMES  DAILY, Disp: 300 tablet, Rfl: 1   ibuprofen (ADVIL) 100 MG tablet, Take 100 mg by mouth every 6 (six) hours as needed for fever., Disp: , Rfl:    isosorbide mononitrate (IMDUR) 30 MG 24 hr tablet, Take 1 tablet (30 mg total) by mouth daily after lunch. Takes at 3 pm, Disp: 90 tablet, Rfl: 0   MAGNESIUM-ZINC PO, Take 1 tablet by mouth daily., Disp: , Rfl:    Multiple Vitamin (MULTIVITAMIN) capsule, Take 1 capsule by mouth daily., Disp: , Rfl:    olmesartan (BENICAR) 40 MG tablet, TAKE 1 TABLET BY MOUTH DAILY, Disp: 90 tablet, Rfl: 0   Olopatadine-Mometasone (RYALTRIS) 665-25 MCG/ACT SUSP, Place 2 sprays into both nostrils daily., Disp: 9 g, Rfl: 5   omeprazole (PRILOSEC OTC) 20 MG tablet, Take 20 mg by mouth as needed., Disp: , Rfl:    oxybutynin (DITROPAN-XL) 5 MG 24 hr tablet, TAKE 1 TABLET BY MOUTH ONCE  DAILY AT BEDTIME, Disp: 100 tablet, Rfl: 0   oxyCODONE (ROXICODONE) 5 MG immediate release tablet, Take 1-2 tablets (5-10 mg total) by mouth every 4 (four) hours as needed for moderate pain or severe pain., Disp: 30 tablet, Rfl: 0   pramipexole (MIRAPEX) 0.5 MG tablet, TAKE 1 TABLET BY MOUTH AT  BEDTIME AS NEEDED, Disp: 100 tablet, Rfl: 2   tiZANidine (ZANAFLEX) 2 MG tablet, TAKE 1 TABLET BY MOUTH  EVERY 8 HOURS AS NEEDED FOR MUSCLE SPASM(S), Disp: 90 tablet, Rfl: 1   valACYclovir (VALTREX) 500 MG tablet, Take 1 tablet (500 mg total) by mouth as needed., Disp: ,  Rfl:    vitamin C (ASCORBIC ACID) 500 MG tablet, Take 500 mg by mouth daily., Disp: , Rfl:   Allergies  Allergen Reactions   Amlodipine Swelling   Meloxicam Palpitations    I personally reviewed active problem list, medication list, allergies, family history, social history, health maintenance with the patient/caregiver today.   ROS  ***  Objective  There were no vitals filed for this visit.  There is no height or weight on file to calculate BMI.  Physical Exam ***  No results found for this or any previous visit (from the past 2160 hour(s)).   PHQ2/9:    05/07/2022    2:49 PM 04/28/2022   10:25 AM 12/04/2021    2:01 PM 10/23/2021    3:19 PM 08/07/2021    1:38 PM  Depression screen PHQ 2/9  Decreased Interest 0 0 0 0 0  Down, Depressed, Hopeless 0 0 0 0 0  PHQ - 2 Score 0 0 0 0 0  Altered sleeping 0 0 0 0 0  Tired, decreased energy 0 0 0 3 0  Change in appetite 0 0 0 3 0  Feeling bad or failure about yourself  0 0 0 0 0  Trouble concentrating 0 0 0 0 0  Moving slowly or fidgety/restless 0 0 0 0 0  Suicidal thoughts 0 0 0 0 0  PHQ-9 Score 0 0 0 6 0    phq 9 is {gen pos [id number]}   Fall Risk:    05/07/2022    2:49 PM 04/28/2022   10:25 AM 12/04/2021    2:01 PM 10/23/2021    3:19 PM 08/07/2021    1:37 PM  Fall Risk   Falls in the past year? 0 0 0 0 0  Number falls in past yr:   0 0 0  Injury with Fall?   0 0 0  Risk for  fall due to : No Fall Risks No Fall Risks No Fall Risks No Fall Risks No Fall Risks  Follow up Falls prevention discussed;Education provided;Falls evaluation completed Falls prevention discussed;Education provided Falls prevention discussed Falls prevention discussed Falls prevention discussed      Functional Status Survey:      Assessment & Plan  *** There are no diagnoses linked to this encounter.

## 2022-10-06 ENCOUNTER — Telehealth: Payer: Self-pay | Admitting: Family Medicine

## 2022-10-06 ENCOUNTER — Ambulatory Visit: Payer: Medicare Other | Admitting: Family Medicine

## 2022-10-06 ENCOUNTER — Other Ambulatory Visit: Payer: Self-pay

## 2022-10-06 DIAGNOSIS — E1169 Type 2 diabetes mellitus with other specified complication: Secondary | ICD-10-CM

## 2022-10-06 NOTE — Telephone Encounter (Signed)
I returned pt call and the person that answered said that Alyssa Lawson was with the chaplain that she will call me back.

## 2022-10-06 NOTE — Telephone Encounter (Signed)
Copied from CRM [id number]. Topic: General - Other >> Oct 06, 2022 10:48 AM Epimenio Foot F wrote: Reason for CRM: Pt called in to reschedule her appointment because her husband passed away. Pt requested to speak with Cassandra regarding rescheduling her appointment. Please follow up with pt.

## 2022-10-10 ENCOUNTER — Ambulatory Visit: Payer: Medicare Other | Admitting: Occupational Therapy

## 2022-10-16 ENCOUNTER — Encounter: Payer: Self-pay | Admitting: Family Medicine

## 2022-10-17 ENCOUNTER — Ambulatory Visit: Payer: Medicare Other | Admitting: Occupational Therapy

## 2022-10-20 ENCOUNTER — Encounter: Payer: Self-pay | Admitting: Physician Assistant

## 2022-10-20 ENCOUNTER — Telehealth (INDEPENDENT_AMBULATORY_CARE_PROVIDER_SITE_OTHER): Payer: Medicare Other | Admitting: Physician Assistant

## 2022-10-20 VITALS — Ht 64.0 in | Wt 154.0 lb

## 2022-10-20 DIAGNOSIS — J302 Other seasonal allergic rhinitis: Secondary | ICD-10-CM

## 2022-10-20 NOTE — Patient Instructions (Addendum)
  I also recommend adding an antihistamine to your daily regimen  This includes medications like Claritin, Allegra, Zyrtec- the generics of these work very well and are usually less expensive  I recommend using Flonase nasal spray - 2 puffs twice per day to help with your nasal congestion  The antihistamines and Flonase can take a few weeks to provide significant relief from allergy symptoms but should start to provide some benefit soon.   You can continue to use Mucinex - the regular formulation is safe for you to use with your blood pressure.   You can use nasal saline sprays and a humidifier at night to help with your breathing and to help keep your nose from getting too dry

## 2022-10-20 NOTE — Progress Notes (Unsigned)
Virtual Visit via Video Note  I connected with Alyssa Lawson on 10/21/22 at  1:20 PM EDT by a video enabled telemedicine application and verified that I am speaking with the correct person using two identifiers.  Today's Provider: Jacquelin Hawking, MHS, PA-C Introduced myself to the patient as a PA-C and provided education on APPs in clinical practice.   Location: Patient: at home  Provider: [facility], [place], [place]    I discussed the limitations of evaluation and management by telemedicine and the availability of in person appointments. The patient expressed understanding and agreed to proceed.   Chief Complaint  Patient presents with   Sinusitis    X4 days   Nasal Congestion    History of Present Illness:   She reports having allergy type symptoms since Friday  She reports she has a lot of drainage and bad breath since Friday  She reports having blood-tinged mucus when her nose runs  She reports her nose is raw and tried applying Neosporin to it yesterday   She had a sore throat Fri-Sun but is overall improved today  She still admits to post-nasal drainage Interventions: Mucinex sinus max, OTC antihistamine (diphenhydramine) , Flonase nasal spray      Review of Systems  Constitutional:  Positive for chills and malaise/fatigue. Negative for fever.  HENT:  Positive for congestion and sinus pain. Negative for ear pain, nosebleeds and sore throat.   Eyes:  Positive for discharge (mild watery eyes yesterday). Negative for blurred vision and double vision.  Respiratory:  Negative for cough, shortness of breath and wheezing.   Gastrointestinal:  Positive for vomiting. Negative for nausea.  Musculoskeletal:  Negative for myalgias.  Neurological:  Negative for dizziness and headaches.     Outpatient Encounter Medications as of 10/20/2022  Medication Sig   acetaminophen (TYLENOL) 500 MG tablet Take 1 tablet (500 mg total) by mouth every 6 (six) hours as needed  for headache.   aspirin 81 MG chewable tablet Chew 1 tablet by mouth at bedtime.   atorvastatin (LIPITOR) 40 MG tablet TAKE 1 TABLET BY MOUTH DAILY   busPIRone (BUSPAR) 5 MG tablet TAKE 1 TABLET(5 MG) BY MOUTH TWICE DAILY AS NEEDED   carvedilol (COREG) 25 MG tablet TAKE 1 TABLET BY MOUTH TWICE  DAILY WITH MEALS   celecoxib (CELEBREX) 100 MG capsule Take 100 mg by mouth every evening.   Cholecalciferol (VITAMIN D) 50 MCG (2000 UT) CAPS Take 1 capsule by mouth daily.   cloNIDine (CATAPRES) 0.1 MG tablet TAKE 1 TABLET BY MOUTH IN THE  EVENING   Cyanocobalamin (VITAMIN B-12) 2000 MCG TBCR Take 1 tablet by mouth daily.   ferrous sulfate 325 (65 FE) MG tablet Take 325 mg by mouth daily with breakfast.   hydrALAZINE (APRESOLINE) 25 MG tablet TAKE 1 TABLET BY MOUTH 3 TIMES  DAILY   ibuprofen (ADVIL) 100 MG tablet Take 100 mg by mouth every 6 (six) hours as needed for fever.   isosorbide mononitrate (IMDUR) 30 MG 24 hr tablet Take 1 tablet (30 mg total) by mouth daily after lunch. Takes at 3 pm   MAGNESIUM-ZINC PO Take 1 tablet by mouth daily.   Multiple Vitamin (MULTIVITAMIN) capsule Take 1 capsule by mouth daily.   olmesartan (BENICAR) 40 MG tablet TAKE 1 TABLET BY MOUTH DAILY   omeprazole (PRILOSEC OTC) 20 MG tablet Take 20 mg by mouth as needed.   oxybutynin (DITROPAN-XL) 5 MG 24 hr tablet TAKE 1 TABLET BY MOUTH ONCE  DAILY AT BEDTIME   oxyCODONE (ROXICODONE) 5 MG immediate release tablet Take 1-2 tablets (5-10 mg total) by mouth every 4 (four) hours as needed for moderate pain or severe pain.   pramipexole (MIRAPEX) 0.5 MG tablet TAKE 1 TABLET BY MOUTH AT  BEDTIME AS NEEDED   tiZANidine (ZANAFLEX) 2 MG tablet TAKE 1 TABLET BY MOUTH  EVERY 8 HOURS AS NEEDED FOR MUSCLE SPASM(S)   valACYclovir (VALTREX) 500 MG tablet Take 1 tablet (500 mg total) by mouth as needed.   vitamin C (ASCORBIC ACID) 500 MG tablet Take 500 mg by mouth daily.   No facility-administered encounter medications on file as of  10/20/2022.      Observations/Objective:  Due to the nature of the virtual visit, physical exam and observations are limited. Able to obtain the following observations:   Alert, oriented, x 3  Appears comfortable, in no acute distress.  No scleral injection, no appreciated hoarseness, tachypnea, wheeze or strider. Able to maintain conversation without visible strain.  No cough appreciated during visit.   Assessment and Plan:   Follow Up Instructions:  Problem List Items Addressed This Visit   None Visit Diagnoses     Seasonal allergic rhinitis, unspecified trigger    -  Primary Suspect this is chronic problem with acute exacerbation Reviewed patient's symptoms which appear to be consistent with allergic rhinitis likely from seasonal allergies Assured patient that it is unlikely that she needs an antibiotic at this time given the nature of her symptoms  Recommend that she switches her daily antihistamine to a second-generation antihistamine such as Claritin, Zyrtec, Allegra or generic version of any of these as desired Reviewed that she should be using her Flonase-2 puffs per nostril twice per day on a daily basis rather than intermittently as she is currently doing. Also reviewed using nasal saline sprays and humidifier to assist with nasal dryness. She may continue to use regular formulation Mucinex to assist with congestion Reviewed that it can take several days to weeks for antihistamine and Flonase to reach full therapeutic effects and that she should remain consistent with use until she returns to her baseline Follow-up as needed for persistent or progressing symptoms         I discussed the assessment and treatment plan with the patient. The patient was provided an opportunity to ask questions and all were answered. The patient agreed with the plan and demonstrated an understanding of the instructions.   The patient was advised to call back or seek an in-person  evaluation if the symptoms worsen or if the condition fails to improve as anticipated.  I provided 16 minutes of non-face-to-face time during this encounter.   No follow-ups on file.   I, Machi Whittaker E Morelia Cassells, PA-C, have reviewed all documentation for this visit. The documentation on 10/21/22 for the exam, diagnosis, procedures, and orders are all accurate and complete.   Jacquelin Hawking, MHS, PA-C [facility]

## 2022-10-20 NOTE — Telephone Encounter (Signed)
Appointment today

## 2022-10-21 NOTE — Progress Notes (Unsigned)
Cardiology Office Note:    Date:  10/22/2022   ID:  Alyssa Lawson, DOB 01-Nov-1945, MRN [id number]  PCP:  Alba Cory, MD  [facility] Providers Cardiologist:  Julien Nordmann, MD    Referring MD: Alba Cory, MD   Chief Complaint:  Follow-up (12 month follow up. Patient states that she has a sinus infection. Meds reviewed. )    Patient Profile:  Hypertension Hyperlipidemia Mild to moderate MR Atrial tachycardia Following diagnostic cardiac catheterization in 2018 DM type II On insulin  Cardiac Studies & Procedures   CARDIAC CATHETERIZATION  CARDIAC CATHETERIZATION 05/01/2017  Findings Coronary Findings Diagnostic  Dominance: Right  No diagnostic findings have been documented. Intervention  No interventions have been documented.   STRESS TESTS  NM MYOCAR MULTI W/SPECT W 04/30/2017  Narrative Pharmacological myocardial perfusion imaging study with large region of ischemia in the mid to apical anteroseptal wall and entire lateral wall Normal wall motion, EF estimated at 63% No EKG changes concerning for ischemia at peak stress or in recovery. Poor exercise tolerance, 3: 30 min GI uptake artifact noted High risk scan   Signed, Dossie Arbour, MD, Ph.D [facility]   ECHOCARDIOGRAM  ECHOCARDIOGRAM LIMITED 05/01/2017  Narrative *[facility]* [address] [place], [place] [address] [phone]  ------------------------------------------------------------------- Transthoracic Echocardiography  Patient:    Alyssa Lawson, Alyssa Lawson MR #:       [id number] Study Date: 05/01/2017 Gender:     F Age:        44 Height:     165.1 cm Weight:     73.9 kg BSA:        1.86 m^2 Pt. Status: Room:  ATTENDING    Dossie Arbour, MD, [facility] REFERRING    Dossie Arbour, MD, [facility] ADMITTING    Mariah Milling, Tim Ernst Bowler, Tim SONOGRAPHER  Cristela Blue RDCS PERFORMING   [facility],  [facility]  cc:  ------------------------------------------------------------------- LV EF: 55% -   60%  ------------------------------------------------------------------- Indications:      Mitral valve insufficiency 424.0.  ------------------------------------------------------------------- History:   PMH:  GERD, hyperlipidemia.  Murmur.  Risk factors: Hypertension.  ------------------------------------------------------------------- Study Conclusions  - Left ventricle: The cavity size was normal. There was moderate concentric hypertrophy. Systolic function was normal. The estimated ejection fraction was in the range of 55% to 60%. Wall motion was normal; there were no regional wall motion abnormalities. The study is not technically sufficient to allow evaluation of LV diastolic function. - Mitral valve: There was mild to moderate regurgitation. - Left atrium: The atrium was mildly dilated. - Right ventricle: Systolic function was normal. - Pulmonary arteries: Systolic pressure was mildly elevated. PA peak pressure: 35 mm Hg (S). - Pericardium, extracardiac: A small to moderate pericardial effusion was identified.  ------------------------------------------------------------------- Study data:   Procedure:  The patient reported no pain pre or post test.          Transthoracic echocardiography.  M-mode, limited 2D, limited spectral Doppler, and color Doppler.  Birthdate:  Patient birthdate: 1945-12-02.  Age:  Patient is 77 yr old.  Sex:  Gender: female.    BMI: 27.1 kg/m^2.  Blood pressure:     173/89  Patient status:  Inpatient.  Study date:  Study date: 05/01/2017. Study time: 10:34 AM.  Location:  In cath recovery bay.  -------------------------------------------------------------------  ------------------------------------------------------------------- Left ventricle:  The cavity size was normal. There was moderate concentric hypertrophy. Systolic function was normal. The  estimated ejection fraction was in the range of 55%  to 60%. Wall motion was normal; there were no regional wall motion abnormalities. The study is not technically sufficient to allow evaluation of LV diastolic function.  ------------------------------------------------------------------- Aortic valve:   Trileaflet; normal thickness leaflets. Mobility was not restricted.  Doppler:  Transvalvular velocity was within the normal range. There was no stenosis. There was no regurgitation.  ------------------------------------------------------------------- Aorta:  Aortic root: The aortic root was normal in size.  ------------------------------------------------------------------- Mitral valve:   Structurally normal valve.   Mobility was not restricted.  Doppler:  Transvalvular velocity was within the normal range. There was no evidence for stenosis. There was mild to moderate regurgitation.  ------------------------------------------------------------------- Left atrium:  The atrium was mildly dilated.  ------------------------------------------------------------------- Right ventricle:  The cavity size was normal. Wall thickness was normal. Systolic function was normal.  ------------------------------------------------------------------- Pulmonic valve:    Doppler:  Transvalvular velocity was within the normal range. There was no evidence for stenosis.  ------------------------------------------------------------------- Tricuspid valve:   Structurally normal valve.    Doppler: Transvalvular velocity was within the normal range. There was mild regurgitation.  ------------------------------------------------------------------- Pulmonary artery:   The main pulmonary artery was normal-sized. Systolic pressure was mildly elevated.  ------------------------------------------------------------------- Right atrium:  The atrium was normal in  size.  ------------------------------------------------------------------- Pericardium:  A small to moderate pericardial effusion was identified.  ------------------------------------------------------------------- Measurements  Left ventricle                          Value       Reference LV ID, ED, PLAX chordal                 51.6  mm    43 - 52 LV ID, ES, PLAX chordal                 33.6  mm    23 - 38 LV fx shortening, PLAX chordal          35    %     >=29 LV PW thickness, ED                     11    mm    --------- IVS/LV PW ratio, ED             (H)     1.45        <=1.3  Ventricular septum                      Value       Reference IVS thickness, ED                       15.9  mm    ---------  Pulmonary arteries                      Value       Reference PA pressure, S, DP              (H)     35    mm Hg <=30  Pulmonic valve                          Value       Reference Pulmonic valve peak velocity, S         73.7  cm/s  ---------  Legend: (L)  and  (H)  mark values outside specified  reference range.  ------------------------------------------------------------------- Prepared and Electronically Authenticated by  Dossie Arbour, MD, [facility] 2018-11-09T12:14:17              History of Present Illness:   Alyssa Lawson is a 77 y.o. female with the above problem list.  She presents today for follow up. Patient states that her husband sadly passed away on 11/06/22. 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   Since she was seen last year by Dr. Mariah Milling, patient reports that cardiovascular health has been stable. She continues to walk and get regular exercise. Denies chest pain, exertional dyspnea, orthopnea, PND, lower extremity edema, palpitations, dizziness/lightheadedness. She reports compliance with all medications and says that her BP with home checks is always 120-130/80s. In the last week, she developed significant post nasal  drip and sinus congestion and began taking over the counter decongestants and cough suppressants. Her BP this morning was elevated, around 156 systolic and she wonders if this is due to OTC sinus medications.     Past Medical History:  Diagnosis Date   Allergy    Anemia    Anxiety 12/04/2021   Arthritis    Atrial tachycardia    Caregiver burden    taking care of husband with dementia   Cervical cancer (HCC)    CHF (congestive heart failure) (HCC)    Chronic lower back pain    Coronary artery disease, non-occlusive    a. LHC 05/01/2017: LM no sig dz, LAD no sig dz, LCx no sig dz, RCA no sig dz, EF 55-65%, mod MR   DDD (degenerative disc disease), cervical    Diet-controlled type 2 diabetes mellitus (HCC)    Endometriosis    GERD (gastroesophageal reflux disease)    History of echocardiogram    a.) TTE 05/01/2017: EF 55-60%, no RWMA, mild-mod MR, mildly dilated LA, RVSF norm, PASP 35 mmHg, sm-mod pericardial effusion   Hyperlipidemia    Hypertension    Restless leg syndrome    a.) on pramipexole   Unstable angina (HCC)    Urinary incontinence    Current Medications: Current Meds  Medication Sig   acetaminophen (TYLENOL) 500 MG tablet Take 1 tablet (500 mg total) by mouth every 6 (six) hours as needed for headache.   aspirin 81 MG chewable tablet Chew 1 tablet by mouth at bedtime.   atorvastatin (LIPITOR) 40 MG tablet TAKE 1 TABLET BY MOUTH DAILY   busPIRone (BUSPAR) 5 MG tablet TAKE 1 TABLET(5 MG) BY MOUTH TWICE DAILY AS NEEDED   carvedilol (COREG) 25 MG tablet TAKE 1 TABLET BY MOUTH TWICE  DAILY WITH MEALS   celecoxib (CELEBREX) 100 MG capsule Take 100 mg by mouth every evening.   Cholecalciferol (VITAMIN D) 50 MCG (2000 UT) CAPS Take 1 capsule by mouth daily.   cloNIDine (CATAPRES) 0.1 MG tablet TAKE 1 TABLET BY MOUTH IN THE  EVENING   Cyanocobalamin (VITAMIN B-12) 2000 MCG TBCR Take 1 tablet by mouth daily.   ferrous sulfate 325 (65 FE) MG tablet Take 325 mg by mouth daily  with breakfast.   ibuprofen (ADVIL) 100 MG tablet Take 100 mg by mouth every 6 (six) hours as needed for fever.   isosorbide mononitrate (IMDUR) 30 MG 24 hr tablet Take 1 tablet (30 mg total) by mouth daily after lunch. Takes at 3 pm   MAGNESIUM-ZINC PO Take 1 tablet by mouth daily.   Multiple Vitamin (MULTIVITAMIN) capsule Take 1 capsule by mouth  daily.   olmesartan (BENICAR) 40 MG tablet TAKE 1 TABLET BY MOUTH DAILY   omeprazole (PRILOSEC OTC) 20 MG tablet Take 20 mg by mouth as needed.   oxybutynin (DITROPAN-XL) 5 MG 24 hr tablet TAKE 1 TABLET BY MOUTH ONCE  DAILY AT BEDTIME   oxyCODONE (ROXICODONE) 5 MG immediate release tablet Take 1-2 tablets (5-10 mg total) by mouth every 4 (four) hours as needed for moderate pain or severe pain.   pramipexole (MIRAPEX) 0.5 MG tablet TAKE 1 TABLET BY MOUTH AT  BEDTIME AS NEEDED   tiZANidine (ZANAFLEX) 2 MG tablet TAKE 1 TABLET BY MOUTH  EVERY 8 HOURS AS NEEDED FOR MUSCLE SPASM(S)   valACYclovir (VALTREX) 500 MG tablet Take 1 tablet (500 mg total) by mouth as needed.   vitamin C (ASCORBIC ACID) 500 MG tablet Take 500 mg by mouth daily.   [DISCONTINUED] hydrALAZINE (APRESOLINE) 25 MG tablet TAKE 1 TABLET BY MOUTH 3 TIMES  DAILY    Allergies:   Amlodipine and Meloxicam   Social History   Occupational History   Occupation: Retired  Tobacco Use   Smoking status: Former    Packs/day: 0.50    Years: 18.00    Additional pack years: 0.00    Total pack years: 9.00    Types: Cigarettes    Start date: 06/23/1956    Quit date: 11/22/1995    Years since quitting: 26.9   Smokeless tobacco: Former    Quit date: 11/22/1996   Tobacco comments:    None  Vaping Use   Vaping Use: Never used  Substance and Sexual Activity   Alcohol use: Yes    Alcohol/week: 1.0 standard drink of alcohol    Types: 1 Glasses of wine per week    Comment: glass of wine occasionally maybe once a month   Drug use: No   Sexual activity: Yes    Partners: Male    Family Hx: The  patient's family history includes Arrhythmia in her father and sister; Breast cancer (age of onset: 61) in her paternal aunt; Cardiomyopathy in her sister; Congestive Heart Failure in her brother; Diabetes in her brother; Heart failure in her father; Hypertension in her brother, father, and sister.  Review of Systems  Constitutional: Negative for weight gain and weight loss.  Cardiovascular:  Negative for chest pain, dyspnea on exertion, irregular heartbeat, leg swelling, orthopnea, palpitations and syncope.  Respiratory:  Negative for cough and shortness of breath.   All other systems reviewed and are negative.    EKGs/Labs/Other Test Reviewed:    EKG:  EKG is ordered today.  The ekg ordered today demonstrates sinus rhythm with PACs/blocked PACs and isolated PVC triplet.   Recent Labs: 05/07/2022: ALT 21; BUN 15; Creat 0.89; Hemoglobin 13.5; Platelets 184; Potassium 4.0; Sodium 142   Recent Lipid Panel Recent Labs    05/07/22 1524  CHOL 132  TRIG 53  HDL 65  LDLCALC 54      Risk Assessment/Calculations/Metrics:         HYPERTENSION CONTROL Vitals:   10/22/22 1500 10/22/22 1559  BP: (!) 193/83 (!) 194/86    The patient's blood pressure is elevated above target today.  In order to address the patient's elevated BP: A current anti-hypertensive medication was adjusted today.; Follow up with general cardiology has been recommended.       Physical Exam:    VS:  BP (!) 194/86 (BP Location: Left Arm, Patient Position: Sitting)   Pulse 70   Ht 5\' 4"  (1.626  m)   Wt 154 lb 12.8 oz (70.2 kg)   SpO2 97%   BMI 26.57 kg/m     Wt Readings from Last 3 Encounters:  10/22/22 154 lb 12.8 oz (70.2 kg)  10/20/22 154 lb (69.9 kg)  07/23/22 154 lb 5.2 oz (70 kg)    Vitals reviewed.  Constitutional:      Appearance: Healthy appearance.  Eyes:     Pupils: Pupils are equal, round, and reactive to light.  Pulmonary:     Effort: Pulmonary effort is normal.  Cardiovascular:      PMI at left midclavicular line. Normal rate. Regular rhythm. Normal S1. Normal S2.      Murmurs: There is a high frequency blowing holosystolic murmur at the apex.     No gallop.  No click. No rub.  Pulses:    Intact distal pulses.  Edema:    Peripheral edema absent.  Abdominal:     General: Bowel sounds are normal.     Palpations: Abdomen is soft.  Musculoskeletal: Normal range of motion. Skin:    General: Skin is warm and dry.  Neurological:     Mental Status: Alert and oriented to person, place and time.         ASSESSMENT & PLAN:   Primary hypertension Patient significantly hypertensive above her usual home readings in clinic today (193/83, 194/86). Typical home BP in the 120s-130s/80s. Suspect that this is secondary to recent frequent use of OTC decongestants/cough suppressants.  Patient instructed to cease use of all OTC cold medication Increase hydralazine to 50mg  TID for BP greater than . Patient told that she can cut back to 25mg  TID if systolic BP less than (suspect improvement off cold medication).  Continue Carvedilol 25mg  BID Clonidine 0.1mg  QD Imdur 30mg  Olmesartan 40mg   Will schedule follow up OV in 2 weeks.  Unstable angina [facility]) Patient with hx false positive stress test, LHC negative for CAD. Continues without chest pain or exertional intolerance.   Mitral regurgitation Patient with mitral regurgitation first noted during 05/01/17 LHC LV gram. Limited TTE showed mild-moderate mitral valve regurgitation. Patient with prominent murmur over apex on physical exam, will repeat TTE.  Abnormal EKG Patient with frequent PACs, blocked PACs on clinic ECG today. No symptoms of palpitations or skipped beats. Will check CBC and BMP as well as repeat TTE.  Dyslipidemia LDL <70 on 05/07/22 lipid panel. Continue Atorvastatin 40mg .              Dispo:  No follow-ups on file.   Medication Adjustments/Labs and Tests Ordered: Current medicines are reviewed  at length with the patient today.  Concerns regarding medicines are outlined above.  Tests Ordered: Orders Placed This Encounter  Procedures   Basic metabolic panel   CBC   EKG 12-Lead   ECHOCARDIOGRAM COMPLETE   Medication Changes: Meds ordered this encounter  Medications   hydrALAZINE (APRESOLINE) 25 MG tablet    Sig: Take 25 mg three times daily.  If the systolic is over 161, take 50 mg.    Dispense:  90 tablet    Refill:  1   Signed, Perlie Gold, PA-C  10/22/2022 4:01 PM    [facility] [address] [place], [place], [place]  [address] Phone: [phone]; Fax: [phone]

## 2022-10-22 ENCOUNTER — Other Ambulatory Visit
Admission: RE | Admit: 2022-10-22 | Discharge: 2022-10-22 | Disposition: A | Discharge status: Home / Self Care | Payer: Medicare Other | Source: Ambulatory Visit | Attending: Cardiology | Admitting: Cardiology

## 2022-10-22 ENCOUNTER — Encounter: Payer: Self-pay | Admitting: Nurse Practitioner

## 2022-10-22 ENCOUNTER — Ambulatory Visit: Payer: Medicare Other | Attending: Nurse Practitioner | Admitting: Cardiology

## 2022-10-22 VITALS — BP 194/86 | HR 70 | Ht 64.0 in | Wt 154.8 lb

## 2022-10-22 DIAGNOSIS — E785 Hyperlipidemia, unspecified: Secondary | ICD-10-CM | POA: Diagnosis not present

## 2022-10-22 DIAGNOSIS — I340 Nonrheumatic mitral (valve) insufficiency: Secondary | ICD-10-CM

## 2022-10-22 DIAGNOSIS — I10 Essential (primary) hypertension: Secondary | ICD-10-CM

## 2022-10-22 DIAGNOSIS — R9431 Abnormal electrocardiogram [ECG] [EKG]: Secondary | ICD-10-CM

## 2022-10-22 DIAGNOSIS — I200 Unstable angina: Secondary | ICD-10-CM | POA: Diagnosis not present

## 2022-10-22 LAB — BASIC METABOLIC PANEL
Anion gap: 10 (ref 5–15)
BUN: 20 mg/dL (ref 8–23)
CO2: 23 mmol/L (ref 22–32)
Calcium: 9.3 mg/dL (ref 8.9–10.3)
Chloride: 105 mmol/L (ref 98–111)
Creatinine, Ser: 0.78 mg/dL (ref 0.44–1.00)
GFR, Estimated: >60 mL/min (ref >60)
Glucose, Bld: 111 mg/dL — ABNORMAL HIGH (ref 70–99)
Potassium: 3.8 mmol/L (ref 3.5–5.1)
Sodium: 138 mmol/L (ref 135–145)

## 2022-10-22 LAB — CBC
HCT: 41 % (ref 36.0–46.0)
Hemoglobin: 13.5 g/dL (ref 12.0–15.0)
MCH: 30.1 pg (ref 26.0–34.0)
MCHC: 32.9 g/dL (ref 30.0–36.0)
MCV: 91.5 fL (ref 80.0–100.0)
Platelets: 218 10*3/uL (ref 150–400)
RBC: 4.48 MIL/uL (ref 3.87–5.11)
RDW: 12.6 % (ref 11.5–15.5)
WBC: 8 10*3/uL (ref 4.0–10.5)
nRBC: 0 % (ref 0.0–0.2)

## 2022-10-22 MED ORDER — HYDRALAZINE HCL 25 MG PO TABS: ORAL_TABLET | ORAL | 1 refills | Status: DC

## 2022-10-22 NOTE — Assessment & Plan Note (Addendum)
Patient with frequent PACs, blocked PACs on clinic ECG today. No symptoms of palpitations or skipped beats. Will check CBC and BMP as well as repeat TTE.

## 2022-10-22 NOTE — Assessment & Plan Note (Signed)
LDL <70 on 05/07/22 lipid panel. Continue Atorvastatin 40mg .

## 2022-10-22 NOTE — Assessment & Plan Note (Signed)
Patient with hx false positive stress test, LHC negative for CAD. Continues without chest pain or exertional intolerance.

## 2022-10-22 NOTE — Patient Instructions (Signed)
Medication Instructions:  HYDRALAZINE: Take 25 mg three times daily. If your systolic (top number) is over 161, then take 50 mg.  *If you need a refill on your cardiac medications before your next appointment, please call your pharmacy*   Lab Work: Your provider would like for you to have following labs drawn: BMET and CBC.   Please go to the [facility] entrance and check in at the front desk.  You do not need an appointment.  They are open from 7am-6 pm.   If you have labs (blood work) drawn today and your tests are completely normal, you will receive your results only by: MyChart Message (if you have MyChart) OR A paper copy in the mail If you have any lab test that is abnormal or we need to change your treatment, we will call you to review the results.   Testing/Procedures: Your physician has requested that you have an echocardiogram. Echocardiography is a painless test that uses sound waves to create images of your heart. It provides your doctor with information about the size and shape of your heart and how well your heart's chambers and valves are working.   You may receive an ultrasound enhancing agent through an IV if needed to better visualize your heart during the echo. This procedure takes approximately one hour.  There are no restrictions for this procedure.  This will take place at [address] [address] [facility], [place] [address]  Follow-Up: At [facility], you and your health needs are our priority.  As part of our continuing mission to provide you with exceptional heart care, we have created designated Provider Care Teams.  These Care Teams include your primary Cardiologist (physician) and Advanced Practice Providers (APPs -  Physician Assistants and Nurse Practitioners) who all work together to provide you with the care you need, when you need it.  We recommend signing up for the patient portal called "MyChart".  Sign up information is  provided on this After Visit Summary.  MyChart is used to connect with patients for Virtual Visits (Telemedicine).  Patients are able to view lab/test results, encounter notes, upcoming appointments, etc.  Non-urgent messages can be sent to your provider as well.   To learn more about what you can do with MyChart, go to [email or web address].    Your next appointment:   2 week(s)  Provider:   You may see Dr. Mariah Milling or one of the following Advanced Practice Providers on your designated Care Team:   Nicolasa Ducking, NP Eula Listen, PA-C Cadence Fransico Michael, PA-C Charlsie Quest, NP

## 2022-10-22 NOTE — Assessment & Plan Note (Signed)
Patient with mitral regurgitation first noted during 05/01/17 LHC LV gram. Limited TTE showed mild-moderate mitral valve regurgitation. Patient with prominent murmur over apex on physical exam, will repeat TTE.

## 2022-10-22 NOTE — Assessment & Plan Note (Addendum)
Patient significantly hypertensive above her usual home readings in clinic today (193/83, 194/86). Typical home BP in the 120s-130s/80s. Suspect that this is secondary to recent frequent use of OTC decongestants/cough suppressants.  Patient instructed to cease use of all OTC cold medication Increase hydralazine to 50mg  TID for BP greater than . Patient told that she can cut back to 25mg  TID if systolic BP less than (suspect improvement off cold medication).  Continue Carvedilol 25mg  BID Clonidine 0.1mg  QD Imdur 30mg  Olmesartan 40mg   Will schedule follow up OV in 2 weeks.

## 2022-10-26 ENCOUNTER — Other Ambulatory Visit: Payer: Self-pay | Admitting: Cardiovascular Disease

## 2022-10-26 ENCOUNTER — Other Ambulatory Visit: Payer: Self-pay | Admitting: Nurse Practitioner

## 2022-10-26 DIAGNOSIS — I10 Essential (primary) hypertension: Secondary | ICD-10-CM

## 2022-10-30 ENCOUNTER — Telehealth (INDEPENDENT_AMBULATORY_CARE_PROVIDER_SITE_OTHER): Payer: Medicare Other | Admitting: Family Medicine

## 2022-10-30 ENCOUNTER — Encounter: Payer: Self-pay | Admitting: Family Medicine

## 2022-10-30 DIAGNOSIS — R0982 Postnasal drip: Secondary | ICD-10-CM | POA: Diagnosis not present

## 2022-10-30 DIAGNOSIS — R051 Acute cough: Secondary | ICD-10-CM

## 2022-10-30 MED ORDER — BENZONATATE 100 MG PO CAPS: 100.0000 mg | ORAL_CAPSULE | Freq: Two times a day (BID) | ORAL | 0 refills | Status: DC | PRN

## 2022-10-30 MED ORDER — AZITHROMYCIN 250 MG PO TABS: ORAL_TABLET | ORAL | 0 refills | Status: AC

## 2022-10-30 NOTE — Progress Notes (Signed)
Name: Alyssa Lawson   MRN: [id number]    DOB: March 02, 1946   Date:10/30/2022       Progress Note  Subjective  Chief Complaint  Chief Complaint  Patient presents with   Sinusitis    I connected with  [name] on 10/30/22 at 10:40 AM EDT by telephone and verified that I am speaking with the correct person using two identifiers.   I discussed the limitations, risks, security and privacy concerns of performing an evaluation and management service by telephone and the availability of in person appointments. Staff also discussed with the patient that there may be a patient responsible charge related to this service. Patient Location: at home Provider Location: [facility] Additional Individuals present: alone  HPI  Symptoms started on April 26 th with sneezing, rhinorrhea and post - nasal drainage , headache, she has also noticed a cough - that is slightly better since last night. She also has some facial pressure . She did a home COVID-19 test that was negative . She has also felt fatigued, lack of appetite. Normal sense of taste and smell. She has nausea when she wakes up and occasional vomiting, no diarrhea. She denies a fever but has chills. Denies SOB  She has tried multiple otc medications  Patient Active Problem List   Diagnosis Date Noted   Primary osteoarthritis of both hands 12/04/2021   Primary osteoarthritis of first carpometacarpal joint of right hand 12/04/2021   Urge incontinence 12/04/2021   Anxiety 12/04/2021   Dyslipidemia associated with type 2 diabetes mellitus (HCC) 12/04/2021   Senile purpura (HCC) 04/05/2021   Cervical radiculitis 04/05/2021   DDD (degenerative disc disease), cervical 04/05/2021   Chronic pain of right thumb 04/05/2021   Type 2 diabetes mellitus with complication, with long-term current use of insulin (HCC) 11/09/2018   Atrial tachycardia 08/25/2017   Unstable angina (HCC) 05/01/2017   Abnormal EKG 04/24/2017   Smoking history 04/24/2017    Rotator cuff tendinitis, left 04/10/2017   Cataracts, bilateral 11/25/2016   Impingement syndrome of shoulder, left 10/13/2016   Chronic bilateral low back pain without sciatica 08/20/2016   Mitral regurgitation 06/22/2015   Melasma 12/21/2014   History of hysterectomy 12/21/2014   RLS (restless legs syndrome) 12/21/2014   Primary hypertension 12/17/2014   Cataract 12/17/2014   Dyslipidemia 12/17/2014   Gastric reflux 12/17/2014   History of cervical cancer 12/17/2014   H/O iron deficiency anemia 12/17/2014   TI (tricuspid incompetence) 12/17/2014   Osteopenia of the elderly 12/17/2014    Social History   Tobacco Use   Smoking status: Former    Packs/day: 0.50    Years: 18.00    Additional pack years: 0.00    Total pack years: 9.00    Types: Cigarettes    Start date: 06/23/1956    Quit date: 11/22/1995    Years since quitting: 26.9   Smokeless tobacco: Former    Quit date: 11/22/1996   Tobacco comments:    None  Substance Use Topics   Alcohol use: Yes    Alcohol/week: 1.0 standard drink of alcohol    Types: 1 Glasses of wine per week    Comment: glass of wine occasionally maybe once a month      Allergies  Allergen Reactions   Amlodipine Swelling   Meloxicam Palpitations    I personally reviewed active problem list, allergies with the patient/caregiver today.  ROS  Ten systems reviewed and is negative except as mentioned in HPI   Objective  Virtual encounter, vitals not obtained.  There is no height or weight on file to calculate BMI.  Nursing Note and Vital Signs reviewed.  Physical Exam  Awake, alert and oriented   Assessment & Plan  1. Acute cough  - azithromycin (ZITHROMAX) 250 MG tablet; Take 2 tablets on day 1, then 1 tablet daily on days 2 through 5  Dispense: 6 tablet; Refill: 0 - benzonatate (TESSALON) 100 MG capsule; Take 1-2 capsules (100-200 mg total) by mouth 2 (two) times daily as needed.  Dispense: 40 capsule; Refill: 0  Explained  importance of returning on Monday for in person visit if no improvement, go to [facility] over the weekend if necessary  Call before coming in on Monday so we can work her in   2. Post-nasal drainage     -Red flags and when to present for emergency care or RTC including fever >101.55F, chest pain, shortness of breath, new/worsening/un-resolving symptoms,  reviewed with patient at time of visit. Follow up and care instructions discussed and provided in AVS. - I discussed the assessment and treatment plan with the patient. The patient was provided an opportunity to ask questions and all were answered. The patient agreed with the plan and demonstrated an understanding of the instructions.  - The patient was advised to call back or seek an in-person evaluation if the symptoms worsen or if the condition fails to improve as anticipated.  I provided 15 minutes of non-face-to-face time during this encounter.  Ruel Favors, MD

## 2022-11-03 ENCOUNTER — Encounter: Payer: Self-pay | Admitting: Family Medicine

## 2022-11-03 ENCOUNTER — Telehealth (INDEPENDENT_AMBULATORY_CARE_PROVIDER_SITE_OTHER): Payer: Medicare Other | Admitting: Internal Medicine

## 2022-11-03 ENCOUNTER — Encounter: Payer: Self-pay | Admitting: Internal Medicine

## 2022-11-03 DIAGNOSIS — R051 Acute cough: Secondary | ICD-10-CM

## 2022-11-03 DIAGNOSIS — U071 COVID-19: Secondary | ICD-10-CM

## 2022-11-03 MED ORDER — NIRMATRELVIR/RITONAVIR (PAXLOVID)TABLET
3.0000 | ORAL_TABLET | Freq: Two times a day (BID) | ORAL | 0 refills | Status: AC
Start: 2022-11-03 — End: 2022-11-08

## 2022-11-03 NOTE — Progress Notes (Signed)
Virtual Visit via Video Note  I connected with Alyssa Lawson on 11/03/22 at 11:40 AM EDT by a video enabled telemedicine application and verified that I am speaking with the correct person using two identifiers.  Location: Patient: Home Provider: [facility]   I discussed the limitations of evaluation and management by telemedicine and the availability of in person appointments. The patient expressed understanding and agreed to proceed.  History of Present Illness:  Patient is presenting via telemedicine because she had a positive COVID test yesterday on 11/02/2022.  She has been having allergy symptoms since the end of April but first noticed COVID symptoms yesterday with lack of smell and taste.  This is the first time she has had COVID.  -Fever: no but chills -Cough: yes - productive  -Shortness of breath: no -Wheezing: yes -Chest tightness: no -Chest congestion: no -Nasal congestion: yes -Runny nose: yes -Sore throat: no -Sinus pressure: yes -Headache: yes -Ear pain: no  -Ear pressure: yes bilateral  -Vomiting: no -Diarrhea: no -Fatigue: yes -Sick contacts: no --Context: fluctuating -Relief with OTC cold/cough medications: no  -Treatments attempted: Mucinex, Flonase, tessalon perles    Observations/Objective:  General: well appearing, no acute distress ENT: conjunctiva normal appearing bilaterally  Pulm: Occasional dry cough Neuro: Answers all questions appropriately  Assessment and Plan:  1. COVID-19/Acute cough: Tested positive on 11/02/2022, symptoms started same day.  Will prescribe antiviral medication, sent to her pharmacy.  Otherwise continue nasal steroids, cough suppressant and Mucinex.  Recommend Coricidin for high blood pressure over-the-counter for congestion.  Patient will quarantine for the next 10 days from onset of symptoms or wear her mask in public.  Patient will follow-up in the office if symptoms worsen or fail to improve.  - nirmatrelvir/ritonavir  (PAXLOVID) 20 x 150 MG & 10 x 100MG  TABS; Take 3 tablets by mouth 2 (two) times daily for 5 days. (Take nirmatrelvir 150 mg two tablets twice daily for 5 days and ritonavir 100 mg one tablet twice daily for 5 days) Patient GFR is >60.  Dispense: 30 tablet; Refill: 0  Follow Up Instructions: As needed    I discussed the assessment and treatment plan with the patient. The patient was provided an opportunity to ask questions and all were answered. The patient agreed with the plan and demonstrated an understanding of the instructions.   The patient was advised to call back or seek an in-person evaluation if the symptoms worsen or if the condition fails to improve as anticipated.  I provided 11 minutes of non-face-to-face time during this encounter.   [name], DO

## 2022-11-03 NOTE — Progress Notes (Deleted)
Date:  11/03/2022   ID:  Alyssa Lawson, DOB 1946-04-08, MRN [id number]  Patient Location:  [address] [place] [address]   Provider location:   [facility], [place] office  PCP:  Alba Cory, MD  Cardiologist:  [facility]  No chief complaint on file.    History of Present Illness:    Alyssa Lawson is a 77 y.o. female  past medical history of HTN Hyperlipidemia:  Metabolic Syndrome last hgbA1C  5.6% , down from 6.3,   Smoked, quit 1996/12/10, started age 29, 30 years, <1 ppd Cardiac catheterization November 2018, no significant disease Who presents for f/u of her abnormal EKG , chest pain, mitral valve regurgitation  Last seen by myself in clinic March 2023 Seen by one of our providers 12/11/22 Husband passed away 2022-11-12 Blood pressure was elevated, hydralazine up to 50 mg 3 times daily Coreg 25 twice daily clonidine 0.1 daily Imdur 30 olmesartan 40 daily  Husband with worsening dementia Has some supports, neighbors that can help "Melt down January", anxiety Started on buspirone Called elder care for [place]  Labs reviewed HBA1C 6.0 Total chol 145, LDL 54  EKG personally reviewed by myself on todays visit Shows normal sinus rhythm rate 56 bpm T wave abnormality V4 through V6, inferior leads, seen previously  Stress test 04/2017 Pharmacological myocardial perfusion imaging study with large region of ischemia in the mid to apical anteroseptal wall and entire lateral wall Normal wall motion, EF estimated at 63% No EKG changes concerning for ischemia at peak stress or in recovery. Poor exercise tolerance, 3: 30 min GI uptake artifact noted High risk scan   Cath 05/01/2017 False positive stress test  MR noted Atrial tachycardia after the case, given metoprolol IV   Echo 05/01/2017 Mild to moderate MR EF >55 Small to moderate pericardial effusion    Past Medical History:  Diagnosis Date   Allergy     Anemia    Anxiety 12/04/2021   Arthritis    Atrial tachycardia    Caregiver burden    taking care of husband with dementia   Cervical cancer (HCC)    CHF (congestive heart failure) (HCC)    Chronic lower back pain    Coronary artery disease, non-occlusive    a. LHC 05/01/2017: LM no sig dz, LAD no sig dz, LCx no sig dz, RCA no sig dz, EF 55-65%, mod MR   DDD (degenerative disc disease), cervical    Diet-controlled type 2 diabetes mellitus (HCC)    Endometriosis    GERD (gastroesophageal reflux disease)    History of echocardiogram    a.) TTE 05/01/2017: EF 55-60%, no RWMA, mild-mod MR, mildly dilated LA, RVSF norm, PASP 35 mmHg, sm-mod pericardial effusion   Hyperlipidemia    Hypertension    Restless leg syndrome    a.) on pramipexole   Unstable angina (HCC)    Urinary incontinence    Past Surgical History:  Procedure Laterality Date   ABDOMINAL HYSTERECTOMY  1976   CARDIAC CATHETERIZATION     CARPOMETACARPAL (CMC) FUSION OF THUMB Right 07/23/2022   Procedure: RIGHT THUMB CMC SUSPENSION ARTHROPLASTY;  Surgeon: Christena Flake, MD;  Location: [facility] ORS;  Service: Orthopedics;  Laterality: Right;   LEFT HEART CATH AND CORONARY ANGIOGRAPHY N/A 05/01/2017   Procedure: LEFT HEART CATH AND CORONARY ANGIOGRAPHY;  Surgeon: Antonieta Iba, MD;  Location: [facility] INVASIVE CV LAB;  Service: Cardiovascular;  Laterality: N/A;   OOPHORECTOMY       No outpatient medications have been marked as taking for the 11/05/22 encounter (Appointment) with Antonieta Iba, MD.     Allergies:   Amlodipine and Meloxicam   Social History   Tobacco Use   Smoking status: Former    Packs/day: 0.50    Years: 18.00    Additional pack years: 0.00    Total pack years: 9.00    Types: Cigarettes    Start date: 06/23/1956    Quit date: 11/22/1995    Years since quitting: 26.9   Smokeless tobacco: Former    Quit date: 11/22/1996   Tobacco comments:    None  Vaping Use   Vaping Use: Never used  Substance Use  Topics   Alcohol use: Yes    Alcohol/week: 1.0 standard drink of alcohol    Types: 1 Glasses of wine per week    Comment: glass of wine occasionally maybe once a month   Drug use: No     Current Outpatient Medications on File Prior to Visit  Medication Sig Dispense Refill   acetaminophen (TYLENOL) 500 MG tablet Take 1 tablet (500 mg total) by mouth every 6 (six) hours as needed for headache. 30 tablet 0   aspirin 81 MG chewable tablet Chew 1 tablet by mouth at bedtime.     atorvastatin (LIPITOR) 40 MG tablet TAKE 1 TABLET BY MOUTH DAILY 90 tablet 0   azithromycin (ZITHROMAX) 250 MG tablet Take 2 tablets on day 1, then 1 tablet daily on days 2 through 5 6 tablet 0   benzonatate (TESSALON) 100 MG capsule Take 1-2 capsules (100-200 mg total) by mouth 2 (two) times daily as needed. 40 capsule 0   busPIRone (BUSPAR) 5 MG tablet TAKE 1 TABLET(5 MG) BY MOUTH TWICE DAILY AS NEEDED 180 tablet 1   carvedilol (COREG) 25 MG tablet TAKE 1 TABLET BY MOUTH TWICE  DAILY WITH MEALS 200 tablet 2   celecoxib (CELEBREX) 100 MG capsule Take 100 mg by mouth every evening.     Cholecalciferol (VITAMIN D) 50 MCG (2000 UT) CAPS Take 1 capsule by mouth daily.     cloNIDine (CATAPRES) 0.1 MG tablet TAKE 1 TABLET BY MOUTH IN THE  EVENING 100 tablet 1   Cyanocobalamin (VITAMIN B-12) 2000 MCG TBCR Take 1 tablet by mouth daily.     ferrous sulfate 325 (65 FE) MG tablet Take 325 mg by mouth daily with breakfast.     hydrALAZINE (APRESOLINE) 25 MG tablet Take 25 mg three times daily.  If the systolic is over 161, take 50 mg. 90 tablet 1   ibuprofen (ADVIL) 100 MG tablet Take 100 mg by mouth every 6 (six) hours as needed for fever.     isosorbide mononitrate (IMDUR) 30 MG 24 hr tablet TAKE 1 TABLET BY MOUTH DAILY  AFTER LUNCH AT 3PM 90 tablet 3   MAGNESIUM-ZINC PO Take 1 tablet by mouth daily.     Multiple Vitamin (MULTIVITAMIN) capsule Take 1 capsule by mouth daily.     nirmatrelvir/ritonavir (PAXLOVID) 20 x 150 MG & 10 x  100MG  TABS Take 3 tablets by mouth 2 (two) times daily for 5 days. (Take nirmatrelvir 150 mg two tablets twice daily for 5 days and ritonavir 100 mg one tablet twice daily for 5 days) Patient GFR is >60. 30 tablet 0   olmesartan (BENICAR) 40 MG tablet TAKE 1 TABLET BY MOUTH DAILY 90 tablet 0   omeprazole (PRILOSEC OTC)  20 MG tablet Take 20 mg by mouth as needed.     oxybutynin (DITROPAN-XL) 5 MG 24 hr tablet TAKE 1 TABLET BY MOUTH ONCE  DAILY AT BEDTIME 100 tablet 0   oxyCODONE (ROXICODONE) 5 MG immediate release tablet Take 1-2 tablets (5-10 mg total) by mouth every 4 (four) hours as needed for moderate pain or severe pain. 30 tablet 0   pramipexole (MIRAPEX) 0.5 MG tablet TAKE 1 TABLET BY MOUTH AT  BEDTIME AS NEEDED 100 tablet 2   tiZANidine (ZANAFLEX) 2 MG tablet TAKE 1 TABLET BY MOUTH  EVERY 8 HOURS AS NEEDED FOR MUSCLE SPASM(S) 90 tablet 1   valACYclovir (VALTREX) 500 MG tablet Take 1 tablet (500 mg total) by mouth as needed.     vitamin C (ASCORBIC ACID) 500 MG tablet Take 500 mg by mouth daily.     No current facility-administered medications on file prior to visit.     Family Hx: The patient's family history includes Arrhythmia in her father and sister; Breast cancer (age of onset: 87) in her paternal aunt; Cardiomyopathy in her sister; Congestive Heart Failure in her brother; Diabetes in her brother; Heart failure in her father; Hypertension in her brother, father, and sister.  ROS:   Please see the history of present illness.    Review of Systems  Constitutional: Negative.   HENT: Negative.    Respiratory: Negative.    Cardiovascular: Negative.   Gastrointestinal: Negative.   Musculoskeletal: Negative.   Neurological: Negative.   Psychiatric/Behavioral: Negative.    All other systems reviewed and are negative.    Labs/Other Tests and Data Reviewed:    Recent Labs: 05/07/2022: ALT 21 10/22/2022: BUN 20; Creatinine, Ser 0.78; Hemoglobin 13.5; Platelets 218; Potassium 3.8;  Sodium 138   Recent Lipid Panel Lab Results  Component Value Date/Time   CHOL 132 05/07/2022 03:24 PM   CHOL 225 (H) 06/22/2015 12:32 PM   TRIG 53 05/07/2022 03:24 PM   HDL 65 05/07/2022 03:24 PM   HDL 76 06/22/2015 12:32 PM   CHOLHDL 2.0 05/07/2022 03:24 PM   LDLCALC 54 05/07/2022 03:24 PM    Wt Readings from Last 3 Encounters:  10/22/22 154 lb 12.8 oz (70.2 kg)  10/20/22 154 lb (69.9 kg)  07/23/22 154 lb 5.2 oz (70 kg)     Exam:    There were no vitals taken for this visit. Constitutional:  oriented to person, place, and time. No distress.  HENT:  Head: Grossly normal Eyes:  no discharge. No scleral icterus.  Neck: No JVD, no carotid bruits  Cardiovascular: Regular rate and rhythm, no murmurs appreciated Pulmonary/Chest: Clear to auscultation bilaterally, no wheezes or rails Abdominal: Soft.  no distension.  no tenderness.  Musculoskeletal: Normal range of motion Neurological:  normal muscle tone. Coordination normal. No atrophy Skin: Skin warm and dry Psychiatric: normal affect, pleasant  ASSESSMENT & PLAN:    Atrial tachycardia (HCC) Continue current medications Well controlled  Type 2 diabetes mellitus with complication, with long-term current use of insulin (HCC) A1c stable, Continue walking  Mixed hyperlipidemia Cholesterol is at goal on the current lipid regimen. No changes to the medications were made.  Smoker Remote smoker, has not smoked in many years  Benign essential HTN Blood pressure is well controlled on today's visit. No changes made to the medications.  Adjustment disorder Husband with dementia, doing the best that she can   Total encounter time more than 30 minutes  Greater than 50% was spent in counseling and coordination  of care with the patient   Signed, Julien Nordmann, MD  11/03/2022 12:54 PM    [facility] [address], [place], [place] [address]

## 2022-11-05 ENCOUNTER — Ambulatory Visit: Payer: Medicare Other | Admitting: Cardiovascular Disease

## 2022-11-05 DIAGNOSIS — E785 Hyperlipidemia, unspecified: Secondary | ICD-10-CM

## 2022-11-05 DIAGNOSIS — I10 Essential (primary) hypertension: Secondary | ICD-10-CM

## 2022-11-05 DIAGNOSIS — Z794 Long term (current) use of insulin: Secondary | ICD-10-CM

## 2022-11-05 DIAGNOSIS — Z87891 Personal history of nicotine dependence: Secondary | ICD-10-CM

## 2022-11-05 DIAGNOSIS — I340 Nonrheumatic mitral (valve) insufficiency: Secondary | ICD-10-CM

## 2022-11-05 DIAGNOSIS — I4719 Other supraventricular tachycardia: Secondary | ICD-10-CM

## 2022-11-12 LAB — HM DIABETES EYE EXAM

## 2022-11-20 ENCOUNTER — Ambulatory Visit: Payer: Medicare Other | Attending: Cardiology

## 2022-11-20 DIAGNOSIS — I340 Nonrheumatic mitral (valve) insufficiency: Secondary | ICD-10-CM

## 2022-11-20 LAB — ECHOCARDIOGRAM COMPLETE
AR max vel: 1.96 cm2
AV Area VTI: 2.15 cm2
AV Area mean vel: 2.11 cm2
AV Mean grad: 10.5 mmHg
AV Peak grad: 19.6 mmHg
Ao pk vel: 2.21 m/s
Area-P 1/2: 4.33 cm2
MV M vel: 5.76 m/s
MV Peak grad: 132.7 mmHg
P 1/2 time: 679 msec
S' Lateral: 2.8 cm

## 2022-11-26 NOTE — Progress Notes (Unsigned)
Name: Alyssa Lawson   MRN: [id number]    DOB: 1946-04-25   Date:11/27/2022       Progress Note  Subjective  Chief Complaint  Follow Up  HPI  HTN: taking coreg 25mg  daily, Benicar 40 , clonidine 0.1 mg qhs and hydralazine 25 mg  TID . BP was high when she came in but normalized with rest. No side effects of medication   Hyperlipidemia: last lipid panel was at goal, LDL was 54 . She is taking atorvastatin and denies muscle aches. We will recheck it yearly    DMII: diagnosed with diabetes years ago, since 2015  She stopped taking metformin due to pt c/o swelling and weight gain with medication. Pt was started Trulicity back in 12/2016 but stopped because of cost. She is on diet only at this time, A1C went from to 6 % to  6.2 % and now is 6.5 % - she will try to focus on low carbohydrate diet. She has associated dyslipidemia and HTN    Urinary incontinence: she has been on Ditropan , tolerating medication well, nocturia has resolved with medication She still has some urinary urgency   Senile purpura: stable and reassurance given Unchanged    RLS: better with medication, she is on Mirapex every night and doing well  Chronic Back Pain: she has daily low back pain, She takes Tylenol and Celebrex daily, very seldom takes Tizanidine now reminded her to not take any otc NSAID's    Unstable angina: indigestion symptoms with abnormal EKG, normal cardiac cath done Nov 2018 by Dr. Mariah Milling, on higher dose of Coreg, Benicar , statin and Imdur, she also takes aspirin  therefore symptoms controlled with medication management   Atrial tachycardia: no recent episodes of palpitation , she is on beta blocker and is rate controlled Unchanged   Cervical radiculitis: she went to PT at [facility], currently only taking tizanidine prn she states tingling now only on right thumb  She states gabapentin did not work, we gave her lyrica but she it no longer taking it and symptoms have improved Unchanged    Patient  Active Problem List   Diagnosis Date Noted   Primary osteoarthritis of both hands 12/04/2021   Primary osteoarthritis of first carpometacarpal joint of right hand 12/04/2021   Urge incontinence 12/04/2021   Anxiety 12/04/2021   Dyslipidemia associated with type 2 diabetes mellitus (HCC) 12/04/2021   Senile purpura (HCC) 04/05/2021   Cervical radiculitis 04/05/2021   DDD (degenerative disc disease), cervical 04/05/2021   Chronic pain of right thumb 04/05/2021   Type 2 diabetes mellitus with complication, with long-term current use of insulin (HCC) 11/09/2018   Atrial tachycardia 08/25/2017   Unstable angina (HCC) 05/01/2017   Abnormal EKG 04/24/2017   Smoking history 04/24/2017   Rotator cuff tendinitis, left 04/10/2017   Cataracts, bilateral 11/25/2016   Impingement syndrome of shoulder, left 10/13/2016   Chronic bilateral low back pain without sciatica 08/20/2016   Mitral regurgitation 06/22/2015   Melasma 12/21/2014   History of hysterectomy 12/21/2014   RLS (restless legs syndrome) 12/21/2014   Primary hypertension 12/17/2014   Cataract 12/17/2014   Dyslipidemia 12/17/2014   Gastric reflux 12/17/2014   History of cervical cancer 12/17/2014   H/O iron deficiency anemia 12/17/2014   TI (tricuspid incompetence) 12/17/2014   Osteopenia of the elderly 12/17/2014    Past Surgical History:  Procedure Laterality Date   ABDOMINAL HYSTERECTOMY  1976   CARDIAC CATHETERIZATION     CARPOMETACARPAL (CMC) FUSION OF  THUMB Right 07/23/2022   Procedure: RIGHT THUMB CMC SUSPENSION ARTHROPLASTY;  Surgeon: Christena Flake, MD;  Location: [facility] ORS;  Service: Orthopedics;  Laterality: Right;   LEFT HEART CATH AND CORONARY ANGIOGRAPHY N/A 05/01/2017   Procedure: LEFT HEART CATH AND CORONARY ANGIOGRAPHY;  Surgeon: Antonieta Iba, MD;  Location: [facility] INVASIVE CV LAB;  Service: Cardiovascular;  Laterality: N/A;   OOPHORECTOMY      Family History  Problem Relation Age of Onset   Heart failure  Father    Arrhythmia Father    Hypertension Father    Arrhythmia Sister    Hypertension Sister    Congestive Heart Failure Brother    Hypertension Brother    Cardiomyopathy Sister    Diabetes Brother    Breast cancer Paternal Aunt 109    Social History   Tobacco Use   Smoking status: Former    Packs/day: 0.50    Years: 18.00    Additional pack years: 0.00    Total pack years: 9.00    Types: Cigarettes    Start date: 06/23/1956    Quit date: 11/22/1995    Years since quitting: 27.0   Smokeless tobacco: Former    Quit date: 11/22/1996   Tobacco comments:    None  Substance Use Topics   Alcohol use: Yes    Alcohol/week: 1.0 standard drink of alcohol    Types: 1 Glasses of wine per week    Comment: glass of wine occasionally maybe once a month     Current Outpatient Medications:    acetaminophen (TYLENOL) 500 MG tablet, Take 1 tablet (500 mg total) by mouth every 6 (six) hours as needed for headache., Disp: 30 tablet, Rfl: 0   aspirin 81 MG chewable tablet, Chew 1 tablet by mouth at bedtime., Disp: , Rfl:    carvedilol (COREG) 25 MG tablet, TAKE 1 TABLET BY MOUTH TWICE  DAILY WITH MEALS, Disp: 200 tablet, Rfl: 2   celecoxib (CELEBREX) 100 MG capsule, Take 100 mg by mouth 2 (two) times daily., Disp: , Rfl:    Cholecalciferol (VITAMIN D) 50 MCG (2000 UT) CAPS, Take 1 capsule by mouth daily., Disp: , Rfl:    cloNIDine (CATAPRES) 0.1 MG tablet, TAKE 1 TABLET BY MOUTH IN THE  EVENING, Disp: 100 tablet, Rfl: 1   Cyanocobalamin (VITAMIN B-12) 2000 MCG TBCR, Take 1 tablet by mouth daily., Disp: , Rfl:    ferrous sulfate 325 (65 FE) MG tablet, Take 325 mg by mouth daily with breakfast., Disp: , Rfl:    hydrALAZINE (APRESOLINE) 25 MG tablet, Take 25 mg three times daily.  If the systolic is over 161, take 50 mg., Disp: 90 tablet, Rfl: 1   isosorbide mononitrate (IMDUR) 30 MG 24 hr tablet, TAKE 1 TABLET BY MOUTH DAILY  AFTER LUNCH AT 3PM, Disp: 90 tablet, Rfl: 3   MAGNESIUM-ZINC PO, Take 1  tablet by mouth daily., Disp: , Rfl:    Multiple Vitamin (MULTIVITAMIN) capsule, Take 1 capsule by mouth daily., Disp: , Rfl:    olmesartan (BENICAR) 40 MG tablet, TAKE 1 TABLET BY MOUTH DAILY, Disp: 90 tablet, Rfl: 0   omeprazole (PRILOSEC OTC) 20 MG tablet, Take 20 mg by mouth as needed., Disp: , Rfl:    pramipexole (MIRAPEX) 0.5 MG tablet, TAKE 1 TABLET BY MOUTH AT  BEDTIME AS NEEDED, Disp: 100 tablet, Rfl: 2   tiZANidine (ZANAFLEX) 2 MG tablet, TAKE 1 TABLET BY MOUTH  EVERY 8 HOURS AS NEEDED FOR MUSCLE SPASM(S), Disp: 90  tablet, Rfl: 1   valACYclovir (VALTREX) 500 MG tablet, Take 1 tablet (500 mg total) by mouth as needed., Disp: , Rfl:    vitamin C (ASCORBIC ACID) 500 MG tablet, Take 500 mg by mouth daily., Disp: , Rfl:    atorvastatin (LIPITOR) 40 MG tablet, Take 1 tablet (40 mg total) by mouth daily., Disp: 90 tablet, Rfl: 1   busPIRone (BUSPAR) 5 MG tablet, TAKE 1 TABLET(5 MG) BY MOUTH TWICE DAILY AS NEEDED, Disp: 180 tablet, Rfl: 1   oxybutynin (DITROPAN-XL) 5 MG 24 hr tablet, Take 1 tablet (5 mg total) by mouth at bedtime., Disp: 100 tablet, Rfl: 0  Allergies  Allergen Reactions   Amlodipine Swelling   Meloxicam Palpitations    I personally reviewed active problem list, medication list, allergies, family history, social history, health maintenance with the patient/caregiver today.   ROS  Ten systems reviewed and is negative except as mentioned in HPI   Objective  Vitals:   11/27/22 0931 11/27/22 0941  BP: (!) 154/76 132/76  Pulse: 69   Resp: 16   Temp: 97.9 F (36.6 C)   TempSrc: Oral   SpO2: 97%   Weight: 155 lb 8 oz (70.5 kg)   Height: 5\' 4"  (1.626 m)     Body mass index is 26.69 kg/m.  Physical Exam  Constitutional: Patient appears well-developed and well-nourished.  No distress.  HEENT: head atraumatic, normocephalic, pupils equal and reactive to light, neck supple Cardiovascular: Normal rate, regular rhythm and normal heart sounds.  No murmur heard. No  BLE edema. Pulmonary/Chest: Effort normal and breath sounds normal. No respiratory distress. Abdominal: Soft.  There is no tenderness. Psychiatric: Patient has a normal mood and affect. behavior is normal. Judgment and thought content normal.   Recent Results (from the past 2160 hour(s))  CBC     Status: None   Collection Time: 10/22/22  4:25 PM  Result Value Ref Range   WBC 8.0 4.0 - 10.5 K/uL   RBC 4.48 3.87 - 5.11 MIL/uL   Hemoglobin 13.5 12.0 - 15.0 g/dL   HCT 95.6 21.3 - 08.6 %   MCV 91.5 80.0 - 100.0 fL   MCH 30.1 26.0 - 34.0 pg   MCHC 32.9 30.0 - 36.0 g/dL   RDW 57.8 46.9 - 62.9 %   Platelets 218 150 - 400 K/uL   nRBC 0.0 0.0 - 0.2 %    Comment: Performed at [facility], [address], [place], [place] [address]  Basic metabolic panel     Status: Abnormal   Collection Time: 10/22/22  4:25 PM  Result Value Ref Range   Sodium 138 135 - 145 mmol/L   Potassium 3.8 3.5 - 5.1 mmol/L   Chloride 105 98 - 111 mmol/L   CO2 23 22 - 32 mmol/L   Glucose, Bld 111 (H) 70 - 99 mg/dL    Comment: Glucose reference range applies only to samples taken after fasting for at least 8 hours.   BUN 20 8 - 23 mg/dL   Creatinine, Ser 3.24 0.44 - 1.00 mg/dL   Calcium 9.3 8.9 - 40.1 mg/dL   GFR, Estimated >02 >72 mL/min    Comment: (NOTE) Calculated using the CKD-EPI Creatinine Equation (2021)    Anion gap 10 5 - 15    Comment: Performed at [facility], [address] [place], [place] [address]  HM DIABETES EYE EXAM     Status: None   Collection Time: 11/12/22 12:00 AM  Result Value  Ref Range   HM Diabetic Eye Exam No Retinopathy No Retinopathy  ECHOCARDIOGRAM COMPLETE     Status: None   Collection Time: 11/20/22 12:55 PM  Result Value Ref Range   AR max vel 1.96 cm2   AV Peak grad 19.6 mmHg   Ao pk vel 2.21 m/s   S' Lateral 2.80 cm   Area-P 1/2 4.33 cm2   AV Area VTI 2.15 cm2   AV Mean grad 10.5 mmHg   P 1/2 time 679 msec   AV Area mean vel 2.11 cm2   MV M vel  5.76 m/s   MV Peak grad 132.7 mmHg   Est EF 55 - 60%   POCT HgB A1C     Status: Abnormal   Collection Time: 11/27/22  9:35 AM  Result Value Ref Range   Hemoglobin A1C 6.5 (A) 4.0 - 5.6 %   HbA1c POC (<> result, manual entry)     HbA1c, POC (prediabetic range)     HbA1c, POC (controlled diabetic range)      PHQ2/9:    11/27/2022    9:24 AM 11/03/2022   11:20 AM 10/20/2022   11:43 AM 05/07/2022    2:49 PM 04/28/2022   10:25 AM  Depression screen PHQ 2/9  Decreased Interest 0 0 0 0 0  Down, Depressed, Hopeless 0 0 0 0 0  PHQ - 2 Score 0 0 0 0 0  Altered sleeping 0 0 0 0 0  Tired, decreased energy 0 0 0 0 0  Change in appetite 0 0 1 0 0  Feeling bad or failure about yourself  0 0 0 0 0  Trouble concentrating 0 0 0 0 0  Moving slowly or fidgety/restless 0 0 0 0 0  Suicidal thoughts 0 0 0 0 0  PHQ-9 Score 0 0 1 0 0  Difficult doing work/chores Not difficult at all Not difficult at all Not difficult at all      phq 9 is negative   Fall Risk:    11/27/2022    9:23 AM 11/03/2022   11:20 AM 10/20/2022   11:42 AM 05/07/2022    2:49 PM 04/28/2022   10:25 AM  Fall Risk   Falls in the past year? 0 0 0 0 0  Number falls in past yr: 0 0     Injury with Fall? 0 0     Risk for fall due to : No Fall Risks  No Fall Risks No Fall Risks No Fall Risks  Follow up Falls prevention discussed;Education provided;Falls evaluation completed  Falls prevention discussed Falls prevention discussed;Education provided;Falls evaluation completed Falls prevention discussed;Education provided    Functional Status Survey: Is the patient deaf or have difficulty hearing?: No Does the patient have difficulty seeing, even when wearing glasses/contacts?: No Does the patient have difficulty concentrating, remembering, or making decisions?: No Does the patient have difficulty walking or climbing stairs?: No Does the patient have difficulty dressing or bathing?: No Does the patient have difficulty doing errands  alone such as visiting a doctor's office or shopping?: No    Assessment & Plan  1. Dyslipidemia associated with type 2 diabetes mellitus (HCC)  - POCT HgB A1C  2. Senile purpura (HCC)  Stable   3. Unstable angina (HCC)  Continue follow up with cardiologist   4. RLS (restless legs syndrome)  Stable   5. Benign essential HTN  Bp is at goal   6. Restless leg syndrome  Continue Mirapex  7. Chronic  bilateral low back pain without sciatica  Doing better on celebrex  8. Dyslipidemia  - atorvastatin (LIPITOR) 40 MG tablet; Take 1 tablet (40 mg total) by mouth daily.  Dispense: 90 tablet; Refill: 1  9. Anxiety  - busPIRone (BUSPAR) 5 MG tablet; TAKE 1 TABLET(5 MG) BY MOUTH TWICE DAILY AS NEEDED  Dispense: 180 tablet; Refill: 1  10. Breast cancer screening by mammogram  - MM 3D SCREENING MAMMOGRAM BILATERAL BREAST; Future

## 2022-11-27 ENCOUNTER — Encounter: Payer: Self-pay | Admitting: Family Medicine

## 2022-11-27 ENCOUNTER — Ambulatory Visit (INDEPENDENT_AMBULATORY_CARE_PROVIDER_SITE_OTHER): Payer: Medicare Other | Admitting: Family Medicine

## 2022-11-27 VITALS — BP 132/76 | HR 69 | Temp 97.9°F | Resp 16 | Ht 64.0 in | Wt 155.5 lb

## 2022-11-27 DIAGNOSIS — F419 Anxiety disorder, unspecified: Secondary | ICD-10-CM

## 2022-11-27 DIAGNOSIS — I10 Essential (primary) hypertension: Secondary | ICD-10-CM | POA: Diagnosis not present

## 2022-11-27 DIAGNOSIS — I200 Unstable angina: Secondary | ICD-10-CM

## 2022-11-27 DIAGNOSIS — D692 Other nonthrombocytopenic purpura: Secondary | ICD-10-CM | POA: Diagnosis not present

## 2022-11-27 DIAGNOSIS — E1169 Type 2 diabetes mellitus with other specified complication: Secondary | ICD-10-CM | POA: Diagnosis not present

## 2022-11-27 DIAGNOSIS — E785 Hyperlipidemia, unspecified: Secondary | ICD-10-CM | POA: Diagnosis not present

## 2022-11-27 DIAGNOSIS — M545 Low back pain, unspecified: Secondary | ICD-10-CM

## 2022-11-27 DIAGNOSIS — G8929 Other chronic pain: Secondary | ICD-10-CM | POA: Diagnosis not present

## 2022-11-27 DIAGNOSIS — G2581 Restless legs syndrome: Secondary | ICD-10-CM | POA: Diagnosis not present

## 2022-11-27 DIAGNOSIS — Z1231 Encounter for screening mammogram for malignant neoplasm of breast: Secondary | ICD-10-CM

## 2022-11-27 LAB — POCT GLYCOSYLATED HEMOGLOBIN (HGB A1C): Hemoglobin A1C: 6.5 % — AB (ref 4.0–5.6)

## 2022-11-27 MED ORDER — OXYBUTYNIN CHLORIDE ER 5 MG PO TB24: 5.0000 mg | ORAL_TABLET | Freq: Every day | ORAL | 0 refills | Status: DC

## 2022-11-27 MED ORDER — ATORVASTATIN CALCIUM 40 MG PO TABS: 40.0000 mg | ORAL_TABLET | Freq: Every day | ORAL | 1 refills | Status: DC

## 2022-11-27 MED ORDER — BUSPIRONE HCL 5 MG PO TABS: ORAL_TABLET | ORAL | 1 refills | Status: DC

## 2022-12-03 ENCOUNTER — Ambulatory Visit: Payer: Medicare Other | Admitting: Nurse Practitioner

## 2022-12-12 ENCOUNTER — Ambulatory Visit: Payer: Medicare Other | Admitting: Family Medicine

## 2022-12-26 ENCOUNTER — Other Ambulatory Visit: Payer: Self-pay | Admitting: Cardiovascular Disease

## 2022-12-26 DIAGNOSIS — I10 Essential (primary) hypertension: Secondary | ICD-10-CM

## 2022-12-29 NOTE — Telephone Encounter (Signed)
Refill Request.  

## 2022-12-29 NOTE — Telephone Encounter (Signed)
Please advise if ok to refill Hydralazine for future refills. Last filled by Perlie Gold, PA.

## 2023-01-02 ENCOUNTER — Ambulatory Visit: Payer: Medicare Other | Admitting: Nurse Practitioner

## 2023-01-08 ENCOUNTER — Ambulatory Visit
Admission: RE | Admit: 2023-01-08 | Discharge: 2023-01-08 | Disposition: A | Discharge status: Home / Self Care | Payer: Medicare Other | Source: Ambulatory Visit | Attending: Family Medicine | Admitting: Family Medicine

## 2023-01-08 DIAGNOSIS — Z1231 Encounter for screening mammogram for malignant neoplasm of breast: Secondary | ICD-10-CM | POA: Diagnosis not present

## 2023-01-31 ENCOUNTER — Other Ambulatory Visit: Payer: Self-pay | Admitting: Family Medicine

## 2023-01-31 DIAGNOSIS — E785 Hyperlipidemia, unspecified: Secondary | ICD-10-CM

## 2023-02-02 ENCOUNTER — Other Ambulatory Visit: Payer: Self-pay

## 2023-02-02 DIAGNOSIS — E785 Hyperlipidemia, unspecified: Secondary | ICD-10-CM

## 2023-02-08 ENCOUNTER — Other Ambulatory Visit: Payer: Self-pay | Admitting: Cardiovascular Disease

## 2023-02-08 DIAGNOSIS — I10 Essential (primary) hypertension: Secondary | ICD-10-CM

## 2023-02-10 ENCOUNTER — Other Ambulatory Visit: Payer: Self-pay | Admitting: Family Medicine

## 2023-02-10 ENCOUNTER — Encounter: Payer: Self-pay | Admitting: Family Medicine

## 2023-02-10 DIAGNOSIS — G2581 Restless legs syndrome: Secondary | ICD-10-CM

## 2023-02-10 MED ORDER — PRAMIPEXOLE DIHYDROCHLORIDE 0.5 MG PO TABS
0.5000 mg | ORAL_TABLET | Freq: Every evening | ORAL | 0 refills | Status: DC | PRN
Start: 2023-02-10 — End: 2023-04-01

## 2023-02-11 ENCOUNTER — Encounter: Payer: Self-pay | Admitting: Nurse Practitioner

## 2023-02-11 ENCOUNTER — Ambulatory Visit (INDEPENDENT_AMBULATORY_CARE_PROVIDER_SITE_OTHER): Payer: Medicare Other | Admitting: Nurse Practitioner

## 2023-02-11 DIAGNOSIS — M5441 Lumbago with sciatica, right side: Secondary | ICD-10-CM | POA: Diagnosis not present

## 2023-02-11 MED ORDER — METHOCARBAMOL 500 MG PO TABS: 500.0000 mg | ORAL_TABLET | Freq: Two times a day (BID) | ORAL | 0 refills | Status: DC | PRN

## 2023-02-11 MED ORDER — PREDNISONE 10 MG (21) PO TBPK
ORAL_TABLET | ORAL | 0 refills | Status: DC
Start: 2023-02-11 — End: 2023-04-01

## 2023-02-11 NOTE — Progress Notes (Addendum)
BP (!) 150/70   Pulse 80   Temp 97.9 F (36.6 C)   Ht 5\' 4"  (1.626 m)   Wt 166 lb 3.2 oz (75.4 kg)   SpO2 92%   BMI 28.53 kg/m    Subjective:    Patient ID: Alyssa Lawson, female    DOB: [birth year], 77 y.o.   MRN: [id number]  HPI: Alyssa Lawson is a 77 y.o. female  Chief Complaint  Patient presents with   Sciatica   Back Pain   Elevated blood pressure reading:  blood pressure 160/80 today, patient is in pain due to sciatica.  Currently takes coreg 25mg  daily, Benicar 40 , clonidine 0.1 mg qhs and hydralazine 25 mg  TID. She says she has been taking her blood pressure medication.  Advised to  monitor blood pressure and let us know if it continues to be elevated.   Low back pain with sciatica: patient reports she started to notice pain last week but she took some tylenol and ignored it until this weekend the pain increased.  She denies any known trauma.  She reports pain goes down her right leg.  She denies any urinary complaints,  numbness or tingling.  She denies any incontinence. She reports she has had sciatica before. She is currently taking celebrex 100 mg BID,  will try robaxin, she says that tizanidine does not work for her.  Will also give steroid taper.  Discussed that it can increase her blood sugar.  She can also do back exercises, printed out on AVS.  She can do heat therapy, use ice hot or lidcaine patches.  If no improvement, may consider physical therapy.   Relevant past medical, surgical, family and social history reviewed and updated as indicated. Interim medical history since our last visit reviewed. Allergies and medications reviewed and updated.  Review of Systems  Constitutional: Negative for fever or weight change.  Respiratory: Negative for cough and shortness of breath.   Cardiovascular: Negative for chest pain or palpitations.  Gastrointestinal: Negative for abdominal pain, no bowel changes.  Musculoskeletal: Negative for gait problem or joint swelling.  Positive low back and leg pain Skin: Negative for rash.  Neurological: Negative for dizziness or headache.  No other specific complaints in a complete review of systems (except as listed in HPI above).      Objective:    BP (!) 150/70   Pulse 80   Temp 97.9 F (36.6 C)   Ht 5\' 4"  (1.626 m)   Wt 166 lb 3.2 oz (75.4 kg)   SpO2 92%   BMI 28.53 kg/m   Wt Readings from Last 3 Encounters:  02/11/23 166 lb 3.2 oz (75.4 kg)  11/27/22 155 lb 8 oz (70.5 kg)  10/22/22 154 lb 12.8 oz (70.2 kg)    Physical Exam  Constitutional: Patient appears well-developed and well-nourished.  No distress.  HEENT: head atraumatic, normocephalic, pupils equal and reactive to light, neck supple, throat within normal limits Cardiovascular: Normal rate, regular rhythm and normal heart sounds.  No murmur heard. No BLE edema. Pulmonary/Chest: Effort normal and breath sounds normal. No respiratory distress. Abdominal: Soft.  There is no tenderness. MSK: positive right straight leg test, tenderness right lower back/hip area Psychiatric: Patient has a normal mood and affect. behavior is normal. Judgment and thought content normal.   Results for orders placed or performed in visit on 11/27/22  POCT HgB A1C  Result Value Ref Range   Hemoglobin A1C 6.5 (A) 4.0 - 5.6 %  HbA1c POC (<> result, manual entry)     HbA1c, POC (prediabetic range)     HbA1c, POC (controlled diabetic range)        Assessment & Plan:   Problem List Items Addressed This Visit       Other   Chronic bilateral low back pain without sciatica   Relevant Medications   methocarbamol (ROBAXIN) 500 MG tablet   predniSONE (STERAPRED UNI-PAK 21 TAB) 10 MG (21) TBPK tablet     Follow up plan: Return if symptoms worsen or fail to improve.

## 2023-02-13 ENCOUNTER — Other Ambulatory Visit: Payer: Self-pay | Admitting: Cardiovascular Disease

## 2023-02-13 DIAGNOSIS — I10 Essential (primary) hypertension: Secondary | ICD-10-CM

## 2023-02-16 ENCOUNTER — Ambulatory Visit (INDEPENDENT_AMBULATORY_CARE_PROVIDER_SITE_OTHER): Payer: Medicare Other | Admitting: Nurse Practitioner

## 2023-02-16 ENCOUNTER — Other Ambulatory Visit: Payer: Self-pay

## 2023-02-16 ENCOUNTER — Encounter: Payer: Self-pay | Admitting: Nurse Practitioner

## 2023-02-16 ENCOUNTER — Ambulatory Visit
Admission: RE | Admit: 2023-02-16 | Discharge: 2023-02-16 | Disposition: A | Discharge status: Home / Self Care | Payer: Medicare Other | Source: Ambulatory Visit | Attending: Nurse Practitioner | Admitting: Nurse Practitioner

## 2023-02-16 VITALS — BP 158/84 | HR 78 | Temp 97.9°F | Resp 16 | Ht 64.0 in | Wt 166.0 lb

## 2023-02-16 DIAGNOSIS — M48061 Spinal stenosis, lumbar region without neurogenic claudication: Secondary | ICD-10-CM | POA: Diagnosis not present

## 2023-02-16 DIAGNOSIS — R29898 Other symptoms and signs involving the musculoskeletal system: Secondary | ICD-10-CM | POA: Insufficient documentation

## 2023-02-16 DIAGNOSIS — M47816 Spondylosis without myelopathy or radiculopathy, lumbar region: Secondary | ICD-10-CM | POA: Diagnosis not present

## 2023-02-16 DIAGNOSIS — M4807 Spinal stenosis, lumbosacral region: Secondary | ICD-10-CM | POA: Diagnosis not present

## 2023-02-16 DIAGNOSIS — M5136 Other intervertebral disc degeneration, lumbar region: Secondary | ICD-10-CM | POA: Diagnosis not present

## 2023-02-16 DIAGNOSIS — M5441 Lumbago with sciatica, right side: Secondary | ICD-10-CM | POA: Diagnosis not present

## 2023-02-16 MED ORDER — GADOBUTROL 1 MMOL/ML IV SOLN
7.0000 mL | Freq: Once | INTRAVENOUS | Status: AC | PRN
  Administered 2023-02-16: 7 mL via INTRAVENOUS

## 2023-02-16 NOTE — Progress Notes (Addendum)
BP (!) 158/84   Pulse 78   Temp 97.9 F (36.6 C) (Oral)   Resp 16   Ht 5\' 4"  (1.626 m)   Wt 166 lb (75.3 kg)   SpO2 98%   BMI 28.49 kg/m    Subjective:    Patient ID: Alyssa Lawson, female    DOB: 1946/06/22, 77 y.o.   MRN: [id number]  HPI: Alyssa Lawson is a 77 y.o. female  Chief Complaint  Patient presents with   Leg Pain    Right worst then left   Low back pain/leg giving out: patient was seen on 02/11/2023 for low back pain with sciatica. She had reported that the pain has started the week prior.  She had taken some tylenol but over last weekend the pain got worse.  She denied any trauma, urinary complaints or incontinence.  She denied any numbness or tingling.  She did report that the pain did go down her right leg.   Patient was already on celebrex , started her on steroid taper and robaxin.  Patient reports she has gotten worse. She says now her right leg is giving out, she reports her right leg is numb and that she has had some incontinence. She reports she almost fell down the stairs yesterday because her leg gave out.  She reports her right leg still feels kind of numb she is still able to walk.  Will get start MRI lumbar to rule out cauda equina syndrome.     Relevant past medical, surgical, family and social history reviewed and updated as indicated. Interim medical history since our last visit reviewed. Allergies and medications reviewed and updated.  Review of Systems  Constitutional: Negative for fever or weight change.  Respiratory: Negative for cough and shortness of breath.   Cardiovascular: Negative for chest pain or palpitations.  Gastrointestinal: Negative for abdominal pain, no bowel changes.  Musculoskeletal: Negative for gait problem or joint swelling. Positive for low back pain, and right leg giving out.  Skin: Negative for rash.  Neurological: Negative for dizziness or headache.  No other specific complaints in a complete review of systems (except as  listed in HPI above).      Objective:    BP (!) 158/84   Pulse 78   Temp 97.9 F (36.6 C) (Oral)   Resp 16   Ht 5\' 4"  (1.626 m)   Wt 166 lb (75.3 kg)   SpO2 98%   BMI 28.49 kg/m   Wt Readings from Last 3 Encounters:  02/16/23 166 lb (75.3 kg)  02/11/23 166 lb 3.2 oz (75.4 kg)  11/27/22 155 lb 8 oz (70.5 kg)    Physical Exam  Constitutional: Patient appears well-developed and well-nourished.  No distress.  HEENT: head atraumatic, normocephalic, pupils equal and reactive to light, neck supple, throat within normal limits Cardiovascular: Normal rate, regular rhythm and normal heart sounds.  No murmur heard. No BLE edema. Pulmonary/Chest: Effort normal and breath sounds normal. No respiratory distress. Abdominal: Soft.  There is no tenderness. MSK: tenderness right of the spine, decreased sensation in right leg Psychiatric: Patient has a normal mood and affect. behavior is normal. Judgment and thought content normal.   Results for orders placed or performed in visit on 11/27/22  POCT HgB A1C  Result Value Ref Range   Hemoglobin A1C 6.5 (A) 4.0 - 5.6 %   HbA1c POC (<> result, manual entry)     HbA1c, POC (prediabetic range)     HbA1c, POC (  controlled diabetic range)        Assessment & Plan:   Problem List Items Addressed This Visit   None Visit Diagnoses     Acute right-sided low back pain with right-sided sciatica    -  Primary   stat MRI lumbar   Relevant Orders   MR Lumbar Spine W Wo Contrast   Weakness of right lower extremity       stat MRI lumbar   Relevant Orders   MR Lumbar Spine W Wo Contrast        Follow up plan: Return if symptoms worsen or fail to improve.

## 2023-02-17 ENCOUNTER — Other Ambulatory Visit: Payer: Self-pay | Admitting: Nurse Practitioner

## 2023-02-17 ENCOUNTER — Encounter: Payer: Self-pay | Admitting: Nurse Practitioner

## 2023-02-17 DIAGNOSIS — M48061 Spinal stenosis, lumbar region without neurogenic claudication: Secondary | ICD-10-CM

## 2023-02-17 DIAGNOSIS — M5441 Lumbago with sciatica, right side: Secondary | ICD-10-CM

## 2023-02-17 MED ORDER — TRAMADOL HCL 50 MG PO TABS
50.0000 mg | ORAL_TABLET | Freq: Three times a day (TID) | ORAL | 0 refills | Status: DC | PRN
Start: 2023-02-17 — End: 2023-04-01

## 2023-02-19 ENCOUNTER — Ambulatory Visit: Payer: Medicare Other

## 2023-02-19 ENCOUNTER — Encounter: Payer: Self-pay | Admitting: Nurse Practitioner

## 2023-02-19 ENCOUNTER — Ambulatory Visit: Payer: Medicare Other | Attending: Cardiovascular Disease | Admitting: Nurse Practitioner

## 2023-02-19 VITALS — BP 168/88 | HR 63 | Ht 65.0 in | Wt 165.0 lb

## 2023-02-19 DIAGNOSIS — R002 Palpitations: Secondary | ICD-10-CM

## 2023-02-19 DIAGNOSIS — R0609 Other forms of dyspnea: Secondary | ICD-10-CM

## 2023-02-19 DIAGNOSIS — I340 Nonrheumatic mitral (valve) insufficiency: Secondary | ICD-10-CM

## 2023-02-19 DIAGNOSIS — E785 Hyperlipidemia, unspecified: Secondary | ICD-10-CM | POA: Diagnosis not present

## 2023-02-19 DIAGNOSIS — I10 Essential (primary) hypertension: Secondary | ICD-10-CM | POA: Diagnosis not present

## 2023-02-19 DIAGNOSIS — E118 Type 2 diabetes mellitus with unspecified complications: Secondary | ICD-10-CM | POA: Diagnosis not present

## 2023-02-19 DIAGNOSIS — I4719 Other supraventricular tachycardia: Secondary | ICD-10-CM | POA: Diagnosis not present

## 2023-02-19 DIAGNOSIS — Z794 Long term (current) use of insulin: Secondary | ICD-10-CM

## 2023-02-19 NOTE — Patient Instructions (Signed)
Medication Instructions:  The current medical regimen is effective;  continue present plan and medications.  *If you need a refill on your cardiac medications before your next appointment, please call your pharmacy*   Testing/Procedures:  ZIO XT- Long Term Monitor Instructions  Your physician has requested you wear a ZIO patch monitor for 14 days.  This is a single patch monitor. [employer] supplies one patch monitor per enrollment. Additional stickers are not available. Please do not apply patch if you will be having a Nuclear Stress Test,  Echocardiogram, Cardiac CT, MRI, or Chest Xray during the period you would be wearing the  monitor. The patch cannot be worn during these tests. You cannot remove and re-apply the  ZIO XT patch monitor.  Your ZIO patch monitor will be mailed 3 day USPS to your address on file. It may take 3-5 days  to receive your monitor after you have been enrolled.  Once you have received your monitor, please review the enclosed instructions. Your monitor  has already been registered assigning a specific monitor serial # to you.  Billing and Patient Assistance Program Information  We have supplied [employer] with any of your insurance information on file for billing purposes. [employer] offers a sliding scale Patient Assistance Program for patients that do not have  insurance, or whose insurance does not completely cover the cost of the ZIO monitor.  You must apply for the Patient Assistance Program to qualify for this discounted rate.  To apply, please call [employer] at [phone], select option 4, select option 2, ask to apply for  Patient Assistance Program. Meredeth Ide will ask your household income, and how many people  are in your household. They will quote your out-of-pocket cost based on that information.  [employer] will also be able to set up a 31-month, interest-free payment plan if needed.  Applying the monitor   Shave hair from upper left chest.  Hold abrader  disc by orange tab. Rub abrader in 40 strokes over the upper left chest as  indicated in your monitor instructions.  Clean area with 4 enclosed alcohol pads. Let dry.  Apply patch as indicated in monitor instructions. Patch will be placed under collarbone on left  side of chest with arrow pointing upward.  Rub patch adhesive wings for 2 minutes. Remove white label marked "1". Remove the white  label marked "2". Rub patch adhesive wings for 2 additional minutes.  While looking in a mirror, press and release button in center of patch. A small green light will  flash 3-4 times. This will be your only indicator that the monitor has been turned on.  Do not shower for the first 24 hours. You may shower after the first 24 hours.  Press the button if you feel a symptom. You will hear a small click. Record Date, Time and  Symptom in the Patient Logbook.  When you are ready to remove the patch, follow instructions on the last 2 pages of Patient  Logbook. Stick patch monitor onto the last page of Patient Logbook.  Place Patient Logbook in the blue and white box. Use locking tab on box and tape box closed  securely. The blue and white box has prepaid postage on it. Please place it in the mailbox as  soon as possible. Your physician should have your test results approximately 7 days after the  monitor has been mailed back to [facility].  Call [employer] Customer Care at [phone] if you have questions regarding  your ZIO  XT patch monitor. Call them immediately if you see an orange light blinking on your  monitor.  If your monitor falls off in less than 4 days, contact our Monitor department at [phone].  If your monitor becomes loose or falls off after 4 days call [employer] at [phone] for  suggestions on securing your monitor    Follow-Up: At [facility], you and your health needs are our priority.  As part of our continuing mission to provide you with exceptional heart  care, we have created designated Provider Care Teams.  These Care Teams include your primary Cardiologist (physician) and Advanced Practice Providers (APPs -  Physician Assistants and Nurse Practitioners) who all work together to provide you with the care you need, when you need it.  We recommend signing up for the patient portal called "MyChart".  Sign up information is provided on this After Visit Summary.  MyChart is used to connect with patients for Virtual Visits (Telemedicine).  Patients are able to view lab/test results, encounter notes, upcoming appointments, etc.  Non-urgent messages can be sent to your provider as well.   To learn more about what you can do with MyChart, go to [email or web address].    Your next appointment:   3 month(s)  Provider:   Julien Nordmann, MD or Nicolasa Ducking, NP

## 2023-02-19 NOTE — Progress Notes (Signed)
Office Visit    Patient Name: SCHWANNA SIEK Date of Encounter: 02/19/2023  Primary Care Provider:  Alba Cory, MD Primary Cardiologist:  Julien Nordmann, MD  Chief Complaint    77 y.o. female with a history of chest pain normal coronary arteries, hypertension, hyperlipidemia, diabetes, atrial tachycardia, mild to moderate mitral regurgitation, diastolic dysfunction, and anxiety, who presents for follow-up related to palpitations.  Past Medical History    Past Medical History:  Diagnosis Date   Allergy    Anemia    Anxiety 12/04/2021   Arthritis    Atrial tachycardia    Caregiver burden    taking care of husband with dementia   Cervical cancer (HCC)    Chest pain    a. 2018 False+ stress test; b. 04/2017 Cath: Nl cors, EF 55-65%, mod MR.   CHF (congestive heart failure) (HCC)    Chronic lower back pain    DDD (degenerative disc disease), cervical    Diastolic dysfunction    Diet-controlled type 2 diabetes mellitus (HCC)    Endometriosis    GERD (gastroesophageal reflux disease)    Hyperlipidemia    Hypertension    Mitral regurgitation    a. TTE 05/01/2017: EF 55-60%, no RWMA, mild-mod MR, mildly dilated LA, RVSF norm, PASP 35 mmHg, sm-mod pericardial effusion; b. 10/2022 Echo: EF 55-60%, no rwma, GrI Dd, nl RV fxn, sev dil LA, mildly dil RA, sm pericardial eff, mild-mod MR, mild AI.   Restless leg syndrome    a.) on pramipexole   Unstable angina (HCC)    Urinary incontinence    Past Surgical History:  Procedure Laterality Date   ABDOMINAL HYSTERECTOMY  1976   CARDIAC CATHETERIZATION     CARPOMETACARPAL (CMC) FUSION OF THUMB Right 07/23/2022   Procedure: RIGHT THUMB CMC SUSPENSION ARTHROPLASTY;  Surgeon: Christena Flake, MD;  Location: [facility] ORS;  Service: Orthopedics;  Laterality: Right;   LEFT HEART CATH AND CORONARY ANGIOGRAPHY N/A 05/01/2017   Procedure: LEFT HEART CATH AND CORONARY ANGIOGRAPHY;  Surgeon: Antonieta Iba, MD;  Location: [facility] INVASIVE CV LAB;   Service: Cardiovascular;  Laterality: N/A;   OOPHORECTOMY      Allergies  Allergies  Allergen Reactions   Amlodipine Swelling   Meloxicam Palpitations    History of Present Illness      77 y.o. y/o female with a history of chest pain normal coronary arteries, hypertension, hyperlipidemia, diabetes, atrial tachycardia, mild to moderate mitral regurgitation, diastolic dysfunction, and anxiety.  She previously underwent evaluation for chest pain in November 2018.  Echo showed normal LV function.  She had an abnormal stress test for, this was followed by diagnostic catheterization which showed normal coronary arteries, normal LV function, and moderate MR.  Repeat echo May 2024 in the setting of hypertension and frequent PACs, continue to show an EF of 55 to 60% without regional wall motion abnormalities, grade 1 diastolic dysfunction, severely dilated left atrium, small pericardial effusion, mild to moderate MR, and mild AI.   At her last visit in May 2024, blood pressure was elevated and she was advised to increase hydralazine to 50 TID when BP > .  She notes that she only has to use a 50 mg tablet once a week or less as pressures are typically less than 140.  Her pressure was greater than 140 this afternoon prior to coming to this visit and therefore, she did take 50 mg.  She does not experience chest pain.  She does have some dyspnea  on exertion, which she thinks is progressed over the past year but at this point, thinks it is mostly related to the heat of summer.  She is also noted occasional fluttering and palpitations, occurring several nights per week lasting a few minutes, resolving spontaneously.  She is interested in event monitoring.  She denies PND, orthopnea, dizziness, syncope, edema, or early satiety.  Home Medications    Current Outpatient Medications  Medication Sig Dispense Refill   acetaminophen (TYLENOL) 500 MG tablet Take 1 tablet (500 mg total) by mouth every 6 (six)  hours as needed for headache. 30 tablet 0   aspirin 81 MG chewable tablet Chew 1 tablet by mouth at bedtime.     atorvastatin (LIPITOR) 40 MG tablet TAKE 1 TABLET BY MOUTH DAILY 100 tablet 2   busPIRone (BUSPAR) 5 MG tablet TAKE 1 TABLET(5 MG) BY MOUTH TWICE DAILY AS NEEDED 180 tablet 1   carvedilol (COREG) 25 MG tablet TAKE 1 TABLET BY MOUTH TWICE  DAILY WITH MEALS 60 tablet 0   celecoxib (CELEBREX) 100 MG capsule Take 100 mg by mouth 2 (two) times daily.     Cholecalciferol (VITAMIN D) 50 MCG (2000 UT) CAPS Take 1 capsule by mouth daily.     cloNIDine (CATAPRES) 0.1 MG tablet TAKE 1 TABLET BY MOUTH ONCE  DAILY IN THE EVENING 90 tablet 0   Cyanocobalamin (VITAMIN B-12) 2000 MCG TBCR Take 1 tablet by mouth daily.     ferrous sulfate 325 (65 FE) MG tablet Take 325 mg by mouth daily with breakfast.     hydrALAZINE (APRESOLINE) 25 MG tablet TAKE 1 TABLET BY MOUTH 3 TIMES  DAILY 270 tablet 0   isosorbide mononitrate (IMDUR) 30 MG 24 hr tablet TAKE 1 TABLET BY MOUTH DAILY  AFTER LUNCH AT 3PM 90 tablet 3   MAGNESIUM-ZINC PO Take 1 tablet by mouth daily.     methocarbamol (ROBAXIN) 500 MG tablet Take 1 tablet (500 mg total) by mouth 2 (two) times daily as needed for muscle spasms. 20 tablet 0   Multiple Vitamin (MULTIVITAMIN) capsule Take 1 capsule by mouth daily.     olmesartan (BENICAR) 40 MG tablet TAKE 1 TABLET BY MOUTH DAILY 90 tablet 0   omeprazole (PRILOSEC OTC) 20 MG tablet Take 20 mg by mouth as needed.     oxybutynin (DITROPAN-XL) 5 MG 24 hr tablet Take 1 tablet (5 mg total) by mouth at bedtime. 100 tablet 0   pramipexole (MIRAPEX) 0.5 MG tablet Take 1 tablet (0.5 mg total) by mouth at bedtime as needed. 100 tablet 0   traMADol (ULTRAM) 50 MG tablet Take 1 tablet (50 mg total) by mouth every 8 (eight) hours as needed for moderate pain. 20 tablet 0   valACYclovir (VALTREX) 500 MG tablet Take 1 tablet (500 mg total) by mouth as needed.     vitamin C (ASCORBIC ACID) 500 MG tablet Take 500 mg by  mouth daily.     predniSONE (STERAPRED UNI-PAK 21 TAB) 10 MG (21) TBPK tablet Take as directed on package.  (60 mg po on day 1, 50 mg po on day 2...) (Patient not taking: Reported on 02/19/2023) 21 tablet 0   No current facility-administered medications for this visit.     Review of Systems    Ongoing dyspnea on exertion.  Occasional palpitations.  She denies chest pain, PND, orthopnea, dizziness, syncope, edema, or early satiety.  All other systems reviewed and are otherwise negative except as noted above.    Physical  Exam    VS:  BP (!) 163/93 (BP Location: Left Arm, Patient Position: Sitting, Cuff Size: Normal)   Pulse 63   Ht 5\' 5"  (1.651 m)   Wt 165 lb (74.8 kg)   SpO2 95%   BMI 27.46 kg/m  , BMI Body mass index is 27.46 kg/m.     Vitals:   02/19/23 1333 02/19/23 1818  BP: (!) 163/93 (!) 168/88  Pulse: 63   SpO2: 95%     GEN: Well nourished, well developed, in no acute distress. HEENT: normal. Neck: Supple, no JVD, carotid bruits, or masses. Cardiac: RRR, 2/6 systolic murmur heard throughout, loudest at the apex.  No rubs or gallops. No clubbing, cyanosis, edema.  Radials 2+/PT 2+ and equal bilaterally.  Respiratory:  Respirations regular and unlabored, clear to auscultation bilaterally. GI: Soft, nontender, nondistended, BS + x 4. MS: no deformity or atrophy. Skin: warm and dry, no rash. Neuro:  Strength and sensation are intact. Psych: Normal affect.  Accessory Clinical Findings    Lab Results  Component Value Date   WBC 8.0 10/22/2022   HGB 13.5 10/22/2022   HCT 41.0 10/22/2022   MCV 91.5 10/22/2022   PLT 218 10/22/2022   Lab Results  Component Value Date   CREATININE 0.78 10/22/2022   BUN 20 10/22/2022   NA 138 10/22/2022   K 3.8 10/22/2022   CL 105 10/22/2022   CO2 23 10/22/2022   Lab Results  Component Value Date   ALT 21 05/07/2022   AST 22 05/07/2022   ALKPHOS 56 08/29/2016   BILITOT 0.7 05/07/2022   Lab Results  Component Value Date    CHOL 132 05/07/2022   HDL 65 05/07/2022   LDLCALC 54 05/07/2022   TRIG 53 05/07/2022   CHOLHDL 2.0 05/07/2022    Lab Results  Component Value Date   HGBA1C 6.5 (A) 11/27/2022   Assessment & Plan    1.  Palpitations/atrial tachycardia: Patient with prior history of palpitations and atrial tachycardia has noted some increase in palpitations recently, especially in the evenings.  She remains on beta-blocker therapy.  Will place a 14-day ZIO monitor to assess for arrhythmias.  2.  Dyspnea on exertion: Patient with progressive dyspnea exertion over the past year in the absence of chest pain.  Recent echo showed normal LV function with stable, mild to moderate but regurgitation and mild AI.  She is euvolemic on examination.  Blood pressure is elevated today though home recordings are typically better.  She previously had a false positive stress test with normal coronary arteries by catheterization in 2018.  We discussed the potential role for coronary CT angiogram and at this time, we have agreed to defer at with plan for follow-up in 3 months to reevaluate symptoms during cooler weather.  3.  Primary hypertension: Blood pressure is elevated today though she notes at home, she is typically less than 140 systolic.  She is taking carvedilol 25 mg twice daily, clonidine 0.1 mg in the evening, Imdur 30 mg daily, Benicar 40 mg daily, and hydralazine 25 mg 3 times daily and 50 mg when blood pressures are greater than 140.  She notes that she rarely has to take a 50 mg tablet, though she did have to do that today and thinks it is just related to the stress of coming to this appointment.  Looking at other vital signs data from recent visits, it appears that blood pressures have been in the 150s over the past week.  Encouraged  her to continue to follow blood pressure and contact us if pressures continue to trend high, as I would have a low threshold to simply increase her to 50 mg 3 times daily of  hydralazine.  4.  Hyperlipidemia: LDL of 54 last November with normal LFTs at that time.  She remains on statin therapy.  5.  Mild to moderate mitral regurgitation: Stable by echo earlier this year.  6.  Type 2 diabetes mellitus: A1c 6.5 in June.  She appears to be diet controlled.  7.  Disposition: Follow-up in 3 months or sooner if necessary.  Arranging for ZIO monitor today.  Nicolasa Ducking, NP 02/19/2023, 2:04 PM

## 2023-02-24 ENCOUNTER — Other Ambulatory Visit: Payer: Self-pay | Admitting: Cardiovascular Disease

## 2023-02-24 DIAGNOSIS — I10 Essential (primary) hypertension: Secondary | ICD-10-CM

## 2023-02-25 DIAGNOSIS — R002 Palpitations: Secondary | ICD-10-CM

## 2023-03-11 ENCOUNTER — Other Ambulatory Visit: Payer: Self-pay | Admitting: Cardiovascular Disease

## 2023-03-11 DIAGNOSIS — I10 Essential (primary) hypertension: Secondary | ICD-10-CM

## 2023-03-14 DIAGNOSIS — R002 Palpitations: Secondary | ICD-10-CM | POA: Diagnosis not present

## 2023-03-16 DIAGNOSIS — M5416 Radiculopathy, lumbar region: Secondary | ICD-10-CM | POA: Diagnosis not present

## 2023-03-16 DIAGNOSIS — G8929 Other chronic pain: Secondary | ICD-10-CM | POA: Diagnosis not present

## 2023-03-16 DIAGNOSIS — M5441 Lumbago with sciatica, right side: Secondary | ICD-10-CM | POA: Diagnosis not present

## 2023-03-31 NOTE — Progress Notes (Unsigned)
Name: Alyssa Lawson   MRN: [id number]    DOB: 1945/10/30   Date:04/01/2023       Progress Note  Subjective  Chief Complaint  Follow Up  HPI  HTN: taking coreg 25mg  daily, Benicar 40 , clonidine 0.1 mg qhs and hydralazine 25 mg  TID . BP is at goal .   SOB/wheezing: she states she was given gabapentin by Dr. Sheppard Penton and after one week of taking the medication she noticed sob, wheezing, fatigue, mild lower extremity edema , diarrhea, lack of appetite and urinary frequency, symptoms lasted 3 days, she stopped taking Gabapentin the day symptoms started. She did not get tested for COVID, explained it may have been unrelated to her symptoms   Hyperlipidemia: last lipid panel was at goal, LDL was 54 . She is taking atorvastatin and denies muscle aches. We will recheck labs in Nov    DMII: diagnosed with diabetes years ago, since 2015  She stopped taking metformin due to pt c/o swelling and weight gain with medication. Pt was started Trulicity back in 12/2016 but stopped because of cost. She is on diet only at this time, A1C went from to 6 % to  6.2 %  it went up to 6.5 %, today is down to 5.9 %, she states she has changed her diet    Urinary incontinence: she has been on Ditropan , tolerating medication well, nocturia has resolved with medication She states urinary frequency is getting worse and asked to increase the dose. Discussed possible side effects   Senile purpura: stable and reassurance given Stable    RLS: better with medication, she is on Mirapex every night and doing well. She needs a refill   Chronic Back Pain: she has daily low back pain, She takes Tylenol and Celebrex daily, very seldom takes Tizanidine. She was given gabapentin and radiculitis down right lower leg resolved. She is going to have steroid injections done soon    Unstable angina: indigestion symptoms with abnormal EKG, normal cardiac cath done Nov 2018 by Dr. Mariah Milling, on higher dose of Coreg, Benicar , statin and Imdur, she  also takes aspirin  therefore symptoms controlled with medication management   Atrial tachycardia: nshe is on beta blocker and is rate controlled She had symptoms over the weekend but resolved .  Cervical radiculitis: she went to PT at [facility] , currently only taking tizanidine prn she states tingling now only on right thumb    Patient Active Problem List   Diagnosis Date Noted   Primary osteoarthritis of both hands 12/04/2021   Primary osteoarthritis of first carpometacarpal joint of right hand 12/04/2021   Urge incontinence 12/04/2021   Anxiety 12/04/2021   Dyslipidemia associated with type 2 diabetes mellitus (HCC) 12/04/2021   Senile purpura (HCC) 04/05/2021   Cervical radiculitis 04/05/2021   DDD (degenerative disc disease), cervical 04/05/2021   Chronic pain of right thumb 04/05/2021   Type 2 diabetes mellitus with complication, with long-term current use of insulin (HCC) 11/09/2018   Atrial tachycardia (HCC) 08/25/2017   Unstable angina (HCC) 05/01/2017   Abnormal EKG 04/24/2017   Smoking history 04/24/2017   Rotator cuff tendinitis, left 04/10/2017   Cataracts, bilateral 11/25/2016   Impingement syndrome of shoulder, left 10/13/2016   Chronic bilateral low back pain without sciatica 08/20/2016   Mitral regurgitation 06/22/2015   Melasma 12/21/2014   History of hysterectomy 12/21/2014   RLS (restless legs syndrome) 12/21/2014   Primary hypertension 12/17/2014   Cataract 12/17/2014   Dyslipidemia  12/17/2014   Gastric reflux 12/17/2014   History of cervical cancer 12/17/2014   H/O iron deficiency anemia 12/17/2014   TI (tricuspid incompetence) 12/17/2014   Osteopenia of the elderly 12/17/2014    Past Surgical History:  Procedure Laterality Date   ABDOMINAL HYSTERECTOMY  1976   CARDIAC CATHETERIZATION     CARPOMETACARPAL (CMC) FUSION OF THUMB Right 07/23/2022   Procedure: RIGHT THUMB CMC SUSPENSION ARTHROPLASTY;  Surgeon: Christena Flake, MD;  Location: [facility] ORS;   Service: Orthopedics;  Laterality: Right;   LEFT HEART CATH AND CORONARY ANGIOGRAPHY N/A 05/01/2017   Procedure: LEFT HEART CATH AND CORONARY ANGIOGRAPHY;  Surgeon: Antonieta Iba, MD;  Location: [facility] INV[facility]IVE [facility];  Service: Cardiovascular;  Laterality: N/A;   OOPHORECTOMY      Family History  Problem Relation Age of Onset   Heart failure Father    Arrhythmia Father    Hypertension Father    Arrhythmia Sister    Hypertension Sister    Congestive Heart Failure Brother    Hypertension Brother    Cardiomyopathy Sister    Diabetes Brother    Breast cancer Paternal Aunt 40    Social History   Tobacco Use   Smoking status: Former    Current packs/day: 0.00    Average packs/day: 0.5 packs/day for 39.4 years (19.7 ttl pk-yrs)    Types: Cigarettes    Start date: 06/23/1956    Quit date: 11/22/1995    Years since quitting: 27.3   Smokeless tobacco: Former    Quit date: 11/22/1996   Tobacco comments:    None  Substance Use Topics   Alcohol use: Yes    Alcohol/week: 1.0 standard drink of alcohol    Types: 1 Glasses of wine per week    Comment: glass of wine occasionally maybe once a month     Current Outpatient Medications:    acetaminophen (TYLENOL) 500 MG tablet, Take 1 tablet (500 mg total) by mouth every 6 (six) hours as needed for headache., Disp: 30 tablet, Rfl: 0   aspirin 81 MG chewable tablet, Chew 1 tablet by mouth at bedtime., Disp: , Rfl:    atorvastatin (LIPITOR) 40 MG tablet, TAKE 1 TABLET BY MOUTH DAILY, Disp: 100 tablet, Rfl: 2   busPIRone (BUSPAR) 5 MG tablet, TAKE 1 TABLET(5 MG) BY MOUTH TWICE DAILY AS NEEDED, Disp: 180 tablet, Rfl: 1   carvedilol (COREG) 25 MG tablet, TAKE 1 TABLET BY MOUTH TWICE  DAILY WITH MEALS, Disp: 60 tablet, Rfl: 11   celecoxib (CELEBREX) 100 MG capsule, Take 100 mg by mouth 2 (two) times daily., Disp: , Rfl:    Cholecalciferol (VITAMIN D) 50 MCG (2000 UT) CAPS, Take 1 capsule by mouth daily., Disp: , Rfl:    cloNIDine (CATAPRES) 0.1 MG  tablet, TAKE 1 TABLET BY MOUTH ONCE  DAILY IN THE EVENING, Disp: 90 tablet, Rfl: 0   Cyanocobalamin (VITAMIN B-12) 2000 MCG TBCR, Take 1 tablet by mouth daily., Disp: , Rfl:    ferrous sulfate 325 (65 FE) MG tablet, Take 325 mg by mouth daily with breakfast., Disp: , Rfl:    hydrALAZINE (APRESOLINE) 25 MG tablet, TAKE 1 TABLET BY MOUTH 3 TIMES  DAILY, Disp: 270 tablet, Rfl: 0   isosorbide mononitrate (IMDUR) 30 MG 24 hr tablet, TAKE 1 TABLET BY MOUTH DAILY  AFTER LUNCH AT 3PM, Disp: 90 tablet, Rfl: 3   MAGNESIUM-ZINC PO, Take 1 tablet by mouth daily., Disp: , Rfl:    Multiple Vitamin (MULTIVITAMIN) capsule, Take  1 capsule by mouth daily., Disp: , Rfl:    olmesartan (BENICAR) 40 MG tablet, TAKE 1 TABLET BY MOUTH DAILY, Disp: 90 tablet, Rfl: 0   omeprazole (PRILOSEC OTC) 20 MG tablet, Take 20 mg by mouth as needed., Disp: , Rfl:    oxybutynin (DITROPAN-XL) 5 MG 24 hr tablet, Take 1 tablet (5 mg total) by mouth at bedtime., Disp: 100 tablet, Rfl: 0   pramipexole (MIRAPEX) 0.5 MG tablet, Take 1 tablet (0.5 mg total) by mouth at bedtime as needed., Disp: 100 tablet, Rfl: 0   valACYclovir (VALTREX) 500 MG tablet, Take 1 tablet (500 mg total) by mouth as needed., Disp: , Rfl:    vitamin C (ASCORBIC ACID) 500 MG tablet, Take 500 mg by mouth daily., Disp: , Rfl:    methocarbamol (ROBAXIN) 500 MG tablet, Take 1 tablet (500 mg total) by mouth 2 (two) times daily as needed for muscle spasms. (Patient not taking: Reported on 04/01/2023), Disp: 20 tablet, Rfl: 0   predniSONE (STERAPRED UNI-PAK 21 TAB) 10 MG (21) TBPK tablet, Take as directed on package.  (60 mg po on day 1, 50 mg po on day 2...) (Patient not taking: Reported on 02/19/2023), Disp: 21 tablet, Rfl: 0   traMADol (ULTRAM) 50 MG tablet, Take 1 tablet (50 mg total) by mouth every 8 (eight) hours as needed for moderate pain. (Patient not taking: Reported on 04/01/2023), Disp: 20 tablet, Rfl: 0  Allergies  Allergen Reactions   Amlodipine Swelling    Meloxicam Palpitations    I personally reviewed active problem list, medication list, allergies, family history, social history, health maintenance with the patient/caregiver today.   ROS  Ten systems reviewed and is negative except as mentioned in HPI    Objective  Vitals:   04/01/23 1458  BP: 138/82  Pulse: 89  Resp: 16  SpO2: 98%  Weight: 161 lb (73 kg)  Height: 5\' 4"  (1.626 m)    Body mass index is 27.64 kg/m.  Physical Exam  Constitutional: Patient appears well-developed and well-nourished.  No distress.  HEENT: head atraumatic, normocephalic, pupils equal and reactive to light, neck supple Cardiovascular: Normal rate, regular rhythm and normal heart sounds.  No murmur heard. No BLE edema. Pulmonary/Chest: Effort normal and breath sounds normal. No respiratory distress. Abdominal: Soft.  There is no tenderness. Muscular skeletal: negative straight leg raise, no pain during palpation of lumbar spine  Psychiatric: Patient has a normal mood and affect. behavior is normal. Judgment and thought content normal.   PHQ2/9:    04/01/2023    2:59 PM 02/16/2023    2:39 PM 02/11/2023    2:19 PM 11/27/2022    9:24 AM 11/03/2022   11:20 AM  Depression screen PHQ 2/9  Decreased Interest 0 0 0 0 0  Down, Depressed, Hopeless 0 0 0 0 0  PHQ - 2 Score 0 0 0 0 0  Altered sleeping 0 1 0 0 0  Tired, decreased energy 1 1 0 0 0  Change in appetite 0 0 0 0 0  Feeling bad or failure about yourself  0 0 0 0 0  Trouble concentrating 0 0 0 0 0  Moving slowly or fidgety/restless 0 0 0 0 0  Suicidal thoughts 0 0 0 0 0  PHQ-9 Score 1 2 0 0 0  Difficult doing work/chores  Not difficult at all Not difficult at all Not difficult at all Not difficult at all    phq 9 is negative   Fall Risk:  04/01/2023    2:58 PM 02/16/2023    2:39 PM 02/11/2023    2:18 PM 11/27/2022    9:23 AM 11/03/2022   11:20 AM  Fall Risk   Falls in the past year? 1 1 0 0 0  Number falls in past yr: 1 0 0 0 0   Injury with Fall? 0 0 0 0 0  Risk for fall due to : No Fall Risks Impaired balance/gait  No Fall Risks   Follow up Falls prevention discussed   Falls prevention discussed;Education provided;Falls evaluation completed       Functional Status Survey: Is the patient deaf or have difficulty hearing?: No Does the patient have difficulty seeing, even when wearing glasses/contacts?: No Does the patient have difficulty concentrating, remembering, or making decisions?: No Does the patient have difficulty walking or climbing stairs?: No Does the patient have difficulty dressing or bathing?: No Does the patient have difficulty doing errands alone such as visiting a doctor's office or shopping?: No    Assessment & Plan  1. Dyslipidemia associated with type 2 diabetes mellitus (HCC)  - POCT HgB A1C - Lipid panel - Microalbumin / creatinine urine ratio - COMPLETE METABOLIC PANEL WITH GFR  2. Senile purpura (HCC)  Reassurance given   3. Unstable angina (HCC)  On medical management   4. Benign essential HTN  - COMPLETE METABOLIC PANEL WITH GFR - CBC with Differential/Platelet  5. Atrial tachycardia (HCC)  On beta blocker and doing well   6. Chronic bilateral low back pain with right-sided sciatica  Under the care of Dr. Sheppard Penton   7. Need for immunization against influenza  - Flu Vaccine Trivalent High Dose (Fluad)  8. RLS (restless legs syndrome)  - pramipexole (MIRAPEX) 0.5 MG tablet; Take 1 tablet (0.5 mg total) by mouth at bedtime as needed.  Dispense: 100 tablet; Refill: 1  9. Urge incontinence  - oxybutynin (DITROPAN-XL) 10 MG 24 hr tablet; Take 1 tablet (10 mg total) by mouth at bedtime.  Dispense: 90 tablet; Refill: 0

## 2023-04-01 ENCOUNTER — Encounter: Payer: Self-pay | Admitting: Family Medicine

## 2023-04-01 ENCOUNTER — Ambulatory Visit (INDEPENDENT_AMBULATORY_CARE_PROVIDER_SITE_OTHER): Payer: Medicare Other | Admitting: Family Medicine

## 2023-04-01 VITALS — BP 138/82 | HR 89 | Resp 16 | Ht 64.0 in | Wt 161.0 lb

## 2023-04-01 DIAGNOSIS — M5441 Lumbago with sciatica, right side: Secondary | ICD-10-CM

## 2023-04-01 DIAGNOSIS — D692 Other nonthrombocytopenic purpura: Secondary | ICD-10-CM | POA: Diagnosis not present

## 2023-04-01 DIAGNOSIS — I4719 Other supraventricular tachycardia: Secondary | ICD-10-CM | POA: Diagnosis not present

## 2023-04-01 DIAGNOSIS — G2581 Restless legs syndrome: Secondary | ICD-10-CM

## 2023-04-01 DIAGNOSIS — I10 Essential (primary) hypertension: Secondary | ICD-10-CM | POA: Diagnosis not present

## 2023-04-01 DIAGNOSIS — Z7985 Long-term (current) use of injectable non-insulin antidiabetic drugs: Secondary | ICD-10-CM | POA: Diagnosis not present

## 2023-04-01 DIAGNOSIS — Z23 Encounter for immunization: Secondary | ICD-10-CM

## 2023-04-01 DIAGNOSIS — E1169 Type 2 diabetes mellitus with other specified complication: Secondary | ICD-10-CM | POA: Diagnosis not present

## 2023-04-01 DIAGNOSIS — I200 Unstable angina: Secondary | ICD-10-CM | POA: Diagnosis not present

## 2023-04-01 DIAGNOSIS — N3941 Urge incontinence: Secondary | ICD-10-CM

## 2023-04-01 DIAGNOSIS — G8929 Other chronic pain: Secondary | ICD-10-CM

## 2023-04-01 DIAGNOSIS — E785 Hyperlipidemia, unspecified: Secondary | ICD-10-CM | POA: Diagnosis not present

## 2023-04-01 LAB — POCT GLYCOSYLATED HEMOGLOBIN (HGB A1C): Hemoglobin A1C: 5.9 % — AB (ref 4.0–5.6)

## 2023-04-01 MED ORDER — OXYBUTYNIN CHLORIDE ER 10 MG PO TB24
10.0000 mg | ORAL_TABLET | Freq: Every day | ORAL | 0 refills | Status: DC
Start: 2023-04-01 — End: 2023-04-01

## 2023-04-01 MED ORDER — PRAMIPEXOLE DIHYDROCHLORIDE 0.5 MG PO TABS
0.5000 mg | ORAL_TABLET | Freq: Every evening | ORAL | 1 refills | Status: DC | PRN
Start: 2023-04-01 — End: 2023-05-19

## 2023-04-01 MED ORDER — PRAMIPEXOLE DIHYDROCHLORIDE 0.5 MG PO TABS
0.5000 mg | ORAL_TABLET | Freq: Every evening | ORAL | 1 refills | Status: DC | PRN
Start: 2023-04-01 — End: 2023-04-01

## 2023-04-01 MED ORDER — OXYBUTYNIN CHLORIDE ER 10 MG PO TB24
10.0000 mg | ORAL_TABLET | Freq: Every day | ORAL | 0 refills | Status: DC
Start: 2023-04-01 — End: 2023-07-30

## 2023-04-03 ENCOUNTER — Other Ambulatory Visit: Payer: Self-pay | Admitting: Family Medicine

## 2023-04-06 DIAGNOSIS — M5441 Lumbago with sciatica, right side: Secondary | ICD-10-CM | POA: Diagnosis not present

## 2023-04-06 DIAGNOSIS — E118 Type 2 diabetes mellitus with unspecified complications: Secondary | ICD-10-CM | POA: Diagnosis not present

## 2023-04-06 DIAGNOSIS — Z794 Long term (current) use of insulin: Secondary | ICD-10-CM | POA: Diagnosis not present

## 2023-04-06 DIAGNOSIS — M5416 Radiculopathy, lumbar region: Secondary | ICD-10-CM | POA: Diagnosis not present

## 2023-04-06 DIAGNOSIS — G8929 Other chronic pain: Secondary | ICD-10-CM | POA: Diagnosis not present

## 2023-04-28 ENCOUNTER — Other Ambulatory Visit: Payer: Self-pay | Admitting: Cardiovascular Disease

## 2023-04-28 DIAGNOSIS — I10 Essential (primary) hypertension: Secondary | ICD-10-CM

## 2023-05-05 ENCOUNTER — Encounter: Payer: Medicare Other | Admitting: Family Medicine

## 2023-05-18 ENCOUNTER — Other Ambulatory Visit: Payer: Self-pay | Admitting: Family Medicine

## 2023-05-18 DIAGNOSIS — G2581 Restless legs syndrome: Secondary | ICD-10-CM

## 2023-05-19 NOTE — Telephone Encounter (Signed)
Called pt no vm set up but pt needs a followup per doctor in feb 2025

## 2023-06-03 ENCOUNTER — Ambulatory Visit: Payer: Medicare Other | Admitting: Nurse Practitioner

## 2023-06-11 ENCOUNTER — Ambulatory Visit (INDEPENDENT_AMBULATORY_CARE_PROVIDER_SITE_OTHER): Payer: Medicare Other

## 2023-06-11 DIAGNOSIS — E785 Hyperlipidemia, unspecified: Secondary | ICD-10-CM | POA: Diagnosis not present

## 2023-06-11 DIAGNOSIS — Z0001 Encounter for general adult medical examination with abnormal findings: Secondary | ICD-10-CM | POA: Diagnosis not present

## 2023-06-11 DIAGNOSIS — E1169 Type 2 diabetes mellitus with other specified complication: Secondary | ICD-10-CM

## 2023-06-11 DIAGNOSIS — Z0000 Encounter for general adult medical examination without abnormal findings: Secondary | ICD-10-CM

## 2023-06-11 NOTE — Progress Notes (Signed)
Subjective:   Alyssa Lawson is a 77 y.o. female who presents for Medicare Annual (Subsequent) preventive examination.  Visit Complete: Virtual I connected with  Alyssa Lawson on 06/11/23 by a audio enabled telemedicine application and verified that I am speaking with the correct person using two identifiers.  Patient Location: Home  Provider Location: Office/Clinic  I discussed the limitations of evaluation and management by telemedicine. The patient expressed understanding and agreed to proceed.  Vital Signs: Because this visit was a virtual/telehealth visit, some criteria may be missing or patient reported. Any vitals not documented were not able to be obtained and vitals that have been documented are patient reported.    Cardiac Risk Factors include: advanced age (>40men, >72 women);dyslipidemia;diabetes mellitus;hypertension     Objective:    There were no vitals filed for this visit. There is no height or weight on file to calculate BMI.     06/11/2023    1:51 PM 04/23/2021    3:10 PM 12/11/2020   12:59 PM 03/22/2020   11:02 AM 03/18/2019   11:00 AM 06/05/2018   12:16 PM 03/16/2018    9:51 AM  Advanced Directives  Does Patient Have a Medical Advance Directive? No No No No No Yes Yes  Type of Careers adviser;Living will Healthcare Power of [place];Living will  Copy of Healthcare Power of Attorney in Chart?       No - copy requested  Would patient like information on creating a medical advance directive? No - Patient declined No - Patient declined  Yes (MAU/Ambulatory/Procedural Areas - Information given) Yes (MAU/Ambulatory/Procedural Areas - Information given)      Current Medications (verified) Outpatient Encounter Medications as of 06/11/2023  Medication Sig   acetaminophen (TYLENOL) 500 MG tablet Take 1 tablet (500 mg total) by mouth every 6 (six) hours as needed for headache.   aspirin 81 MG chewable tablet Chew 1 tablet by  mouth at bedtime.   atorvastatin (LIPITOR) 40 MG tablet TAKE 1 TABLET BY MOUTH DAILY   busPIRone (BUSPAR) 5 MG tablet TAKE 1 TABLET(5 MG) BY MOUTH TWICE DAILY AS NEEDED   carvedilol (COREG) 25 MG tablet TAKE 1 TABLET BY MOUTH TWICE  DAILY WITH MEALS   celecoxib (CELEBREX) 100 MG capsule Take 100 mg by mouth 2 (two) times daily.   Cholecalciferol (VITAMIN D) 50 MCG (2000 UT) CAPS Take 1 capsule by mouth daily.   cloNIDine (CATAPRES) 0.1 MG tablet TAKE 1 TABLET BY MOUTH ONCE  DAILY IN THE EVENING   Cyanocobalamin (VITAMIN B-12) 2000 MCG TBCR Take 1 tablet by mouth daily.   ferrous sulfate 325 (65 FE) MG tablet Take 325 mg by mouth daily with breakfast.   hydrALAZINE (APRESOLINE) 25 MG tablet TAKE 1 TABLET BY MOUTH 3 TIMES  DAILY   isosorbide mononitrate (IMDUR) 30 MG 24 hr tablet TAKE 1 TABLET BY MOUTH DAILY  AFTER LUNCH AT 3PM   MAGNESIUM-ZINC PO Take 1 tablet by mouth daily.   Multiple Vitamin (MULTIVITAMIN) capsule Take 1 capsule by mouth daily.   olmesartan (BENICAR) 40 MG tablet TAKE 1 TABLET BY MOUTH DAILY   omeprazole (PRILOSEC OTC) 20 MG tablet Take 20 mg by mouth as needed.   oxybutynin (DITROPAN-XL) 10 MG 24 hr tablet Take 1 tablet (10 mg total) by mouth at bedtime.   pramipexole (MIRAPEX) 0.5 MG tablet TAKE 1 TABLET(0.5 MG) BY MOUTH AT BEDTIME AS NEEDED   vitamin C (ASCORBIC ACID) 500  MG tablet Take 500 mg by mouth daily.   valACYclovir (VALTREX) 500 MG tablet Take 1 tablet (500 mg total) by mouth as needed.   No facility-administered encounter medications on file as of 06/11/2023.    Allergies (verified) Amlodipine and Meloxicam   History: Past Medical History:  Diagnosis Date   Allergy    Anemia    Anxiety 12/04/2021   Arthritis    Atrial tachycardia (HCC)    Caregiver burden    taking care of husband with dementia   Cervical cancer (HCC)    Chest pain    a. 2018 False+ stress test; b. 04/2017 Cath: Nl cors, EF 55-65%, mod MR.   CHF (congestive heart failure) (HCC)     Chronic lower back pain    DDD (degenerative disc disease), cervical    Diastolic dysfunction    Diet-controlled type 2 diabetes mellitus (HCC)    Endometriosis    GERD (gastroesophageal reflux disease)    Hyperlipidemia    Hypertension    Mitral regurgitation    a. TTE 05/01/2017: EF 55-60%, no RWMA, mild-mod MR, mildly dilated LA, RVSF norm, PASP 35 mmHg, sm-mod pericardial effusion; b. 10/2022 Echo: EF 55-60%, no rwma, GrI Dd, nl RV fxn, sev dil LA, mildly dil RA, sm pericardial eff, mild-mod MR, mild AI.   Restless leg syndrome    a.) on pramipexole   Unstable angina (HCC)    Urinary incontinence    Past Surgical History:  Procedure Laterality Date   ABDOMINAL HYSTERECTOMY  1976   CARDIAC CATHETERIZATION     CARPOMETACARPAL (CMC) FUSION OF THUMB Right 07/23/2022   Procedure: RIGHT THUMB CMC SUSPENSION ARTHROPLASTY;  Surgeon: Christena Flake, MD;  Location: [facility] ORS;  Service: Orthopedics;  Laterality: Right;   LEFT HEART CATH AND CORONARY ANGIOGRAPHY N/A 05/01/2017   Procedure: LEFT HEART CATH AND CORONARY ANGIOGRAPHY;  Surgeon: Antonieta Iba, MD;  Location: [facility] INVASIVE CV LAB;  Service: Cardiovascular;  Laterality: N/A;   OOPHORECTOMY     Family History  Problem Relation Age of Onset   Heart failure Father    Arrhythmia Father    Hypertension Father    Arrhythmia Sister    Hypertension Sister    Congestive Heart Failure Brother    Hypertension Brother    Cardiomyopathy Sister    Diabetes Brother    Breast cancer Paternal Aunt 43   Social History   Socioeconomic History   Marital status: Widowed    Spouse name: Aneta Mins   Number of children: 1   Years of education: some college   Highest education level: 12th grade  Occupational History   Occupation: Retired  Tobacco Use   Smoking status: Former    Current packs/day: 0.00    Average packs/day: 0.5 packs/day for 39.4 years (19.7 ttl pk-yrs)    Types: Cigarettes    Start date: 06/23/1956    Quit date: 11/22/1995     Years since quitting: 27.5   Smokeless tobacco: Former    Quit date: 11/22/1996   Tobacco comments:    None  Vaping Use   Vaping status: Never Used  Substance and Sexual Activity   Alcohol use: Yes    Alcohol/week: 1.0 standard drink of alcohol    Types: 1 Glasses of wine per week    Comment: glass of wine occasionally maybe once a month   Drug use: No   Sexual activity: Yes    Partners: Male  Other Topics Concern   Not on file  Social  History Narrative   She stopped working Spring 2018, 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 [employer] - she retired because of stress   Social Drivers of Corporate investment banker Strain: Low Risk  (06/11/2023)   Overall Financial Resource Strain (CARDIA)    Difficulty of Paying Living Expenses: Not hard at all  Food Insecurity: No Food Insecurity (06/11/2023)   Hunger Vital Sign    Worried About Running Out of Food in the Last Year: Never true    Ran Out of Food in the Last Year: Never true  Transportation Needs: No Transportation Needs (06/11/2023)   PRAPARE - Administrator, Civil Service (Medical): No    Lack of Transportation (Non-Medical): No  Physical Activity: Insufficiently Active (06/11/2023)   Exercise Vital Sign    Days of Exercise per Week: 3 days    Minutes of Exercise per Session: 30 min  Stress: No Stress Concern Present (06/11/2023)   [employer] of Occupational Health - Occupational Stress Questionnaire    Feeling of Stress : Not at all  Social Connections: Moderately Isolated (06/11/2023)   Social Connection and Isolation Panel [NHANES]    Frequency of Communication with Friends and Family: More than three times a week    Frequency of Social Gatherings with Friends and Family: More than three times a week    Attends Religious Services: More than 4 times per year    Active Member of [employer] or Organizations: No    Attends Banker Meetings: Never    Marital  Status: Widowed    Tobacco Counseling Counseling given: Not Answered Tobacco comments: None   Clinical Intake:  Pre-visit preparation completed: Yes  Pain : No/denies pain     BMI - recorded: 27.6 Nutritional Status: BMI 25 -29 Overweight Nutritional Risks: None Diabetes: Yes CBG done?: No Did pt. bring in CBG monitor from home?: No  How often do you need to have someone help you when you read instructions, pamphlets, or other written materials from your doctor or pharmacy?: 1 - Never  Interpreter Needed?: No  Information entered by :: Kennedy Bucker, LPN   Activities of Daily Living    06/11/2023    1:52 PM 04/28/2023   10:23 AM  In your present state of health, do you have any difficulty performing the following activities:  Hearing? 0 0  Vision? 0 0  Difficulty concentrating or making decisions? 0 0  Walking or climbing stairs? 0 0  Dressing or bathing? 0 0  Doing errands, shopping? 0 0  Preparing Food and eating ? N N  Using the Toilet? N N  In the past six months, have you accidently leaked urine? Y Y  Do you have problems with loss of bowel control? N N  Managing your Medications? N N  Managing your Finances? N N  Housekeeping or managing your Housekeeping? N N    Patient Care Team: Alba Cory, MD as PCP - General (Family Medicine) Antonieta Iba, MD as PCP - Cardiology (Cardiology) Antonieta Iba, MD as Consulting Physician (Cardiology) Dedra Skeens, PA-C (Orthopedic Surgery)  Indicate any recent Medical Services you may have received from other than [facility] providers in the past year (date may be approximate).     Assessment:   This is a routine wellness examination for [facility].  Hearing/Vision screen Hearing Screening - Comments:: NO AIDS Vision Screening - Comments:: WEARS GLASSES- [facility] [place]   Goals Addressed  This Visit's Progress    DIET - EAT MORE FRUITS AND VEGETABLES         Depression  Screen    06/11/2023    1:49 PM 04/01/2023    2:59 PM 02/16/2023    2:39 PM 02/11/2023    2:19 PM 11/27/2022    9:24 AM 11/03/2022   11:20 AM 10/20/2022   11:43 AM  PHQ 2/9 Scores  PHQ - 2 Score 0 0 0 0 0 0 0  PHQ- 9 Score 0 1 2 0 0 0 1    Fall Risk    06/11/2023    1:51 PM 04/28/2023   10:23 AM 04/01/2023    2:58 PM 02/16/2023    2:39 PM 02/11/2023    2:18 PM  Fall Risk   Falls in the past year? 1 1 1 1  0  Number falls in past yr: 1 1 1  0 0  Injury with Fall? 0 0 0 0 0  Risk for fall due to : Impaired mobility  No Fall Risks Impaired balance/gait   Follow up Falls evaluation completed;Falls prevention discussed  Falls prevention discussed      MEDICARE RISK AT HOME: Medicare Risk at Home Any stairs in or around the home?: No If so, are there any without handrails?: No Home free of loose throw rugs in walkways, pet beds, electrical cords, etc?: Yes Adequate lighting in your home to reduce risk of falls?: Yes Life alert?: No Use of a cane, walker or w/c?: No Grab bars in the bathroom?: Yes Shower chair or bench in shower?: Yes Elevated toilet seat or a handicapped toilet?: Yes  TIMED UP AND GO:  Was the test performed?  No    Cognitive Function:        06/11/2023    1:59 PM 04/28/2022   10:24 AM 03/22/2020   11:04 AM 03/18/2019   11:04 AM 03/16/2018    9:52 AM  6CIT Screen  What Year? 0 points 0 points 0 points 0 points 0 points  What month? 0 points 0 points 0 points 0 points 0 points  What time? 0 points 0 points 0 points 0 points 0 points  Count back from 20 0 points 0 points 0 points 0 points 0 points  Months in reverse 0 points 0 points 0 points 0 points 0 points  Repeat phrase 0 points 0 points 0 points 0 points 4 points  Total Score 0 points 0 points 0 points 0 points 4 points    Immunizations Immunization History  Administered Date(s) Administered   Fluad Quad(high Dose 65+) 05/10/2019, 03/30/2020, 04/05/2021, 05/07/2022   Fluad Trivalent(High Dose 65+)  04/01/2023   Influenza Split 04/27/2007, 04/19/2009   Influenza, High Dose Seasonal PF 06/22/2015, 06/06/2016, 02/25/2017, 04/21/2018   Influenza,inj,Quad PF,6+ Mos 03/15/2013, 06/19/2014   Moderna Covid-19 Vaccine Bivalent Booster 15yrs & up 11/21/2021   Moderna Sars-Covid-2 Vaccination 06/23/2019, 07/22/2019, 02/11/2020, 09/30/2020, 03/02/2021   PNEUMOCOCCAL CONJUGATE-20 05/02/2022   Pneumococcal Conjugate-13 12/21/2014   Pneumococcal Polysaccharide-23 04/27/2007, 09/14/2012   Respiratory Syncytial Virus Vaccine,Recomb Aduvanted(Arexvy) 05/02/2022   Td 11/02/2007   Tdap 01/20/2018   Zoster, Live 07/12/2013    TDAP status: Up to date  Flu Vaccine status: Up to date  Pneumococcal vaccine status: Up to date  Covid-19 vaccine status: Completed vaccines  Qualifies for Shingles Vaccine? Yes   Zostavax completed Yes   Shingrix Completed?: No.    Education has been provided regarding the importance of this vaccine. Patient has been advised to  call insurance company to determine out of pocket expense if they have not yet received this vaccine. Advised may also receive vaccine at local pharmacy or Health Dept. Verbalized acceptance and understanding.  Screening Tests Health Maintenance  Topic Date Due   COVID-19 Vaccine (7 - 2024-25 season) 02/22/2023   Diabetic kidney evaluation - Urine ACR  05/08/2023   FOOT EXAM  05/08/2023   Zoster Vaccines- Shingrix (1 of 2) 07/01/2023 (Originally 02/21/1965)   HEMOGLOBIN A1C  09/30/2023   Diabetic kidney evaluation - eGFR measurement  10/22/2023   OPHTHALMOLOGY EXAM  11/12/2023   MAMMOGRAM  01/08/2024   Medicare Annual Wellness (AWV)  06/10/2024   DTaP/Tdap/Td (3 - Td or Tdap) 01/21/2028   Pneumonia Vaccine 40+ Years old  Completed   INFLUENZA VACCINE  Completed   DEXA SCAN  Completed   Hepatitis C Screening  Completed   HPV VACCINES  Aged Out   Colonoscopy  Discontinued    Health Maintenance  Health Maintenance Due  Topic Date Due    COVID-19 Vaccine (7 - 2024-25 season) 02/22/2023   Diabetic kidney evaluation - Urine ACR  05/08/2023   FOOT EXAM  05/08/2023    Colorectal cancer screening: No longer required.   Mammogram status: Completed 01/08/23. Repeat every year  Bone Density status: Completed 08/04/18. Results reflect: Bone density results: OSTEOPENIA. Repeat every 3-5 years.  Lung Cancer Screening: (Low Dose CT Chest recommended if Age 73-80 years, 20 pack-year currently smoking OR have quit w/in 15years.) does not qualify.   Additional Screening:  Hepatitis C Screening: does qualify; Completed 09/14/12  Vision Screening: Recommended annual ophthalmology exams for early detection of glaucoma and other disorders of the eye. Is the patient up to date with their annual eye exam?  Yes  Who is the provider or what is the name of the office in which the patient attends annual eye exams? [facility] [place] If pt is not established with a provider, would they like to be referred to a provider to establish care? No .   Dental Screening: Recommended annual dental exams for proper oral hygiene  Diabetic Foot Exam: Diabetic Foot Exam: Completed 05/07/22  Community Resource Referral / Chronic Care Management: CRR required this visit?  No   CCM required this visit?  No     Plan:     I have personally reviewed and noted the following in the patient's chart:   Medical and social history Use of alcohol, tobacco or illicit drugs  Current medications and supplements including opioid prescriptions. Patient is not currently taking opioid prescriptions. Functional ability and status Nutritional status Physical activity Advanced directives List of other physicians Hospitalizations, surgeries, and ER visits in previous 12 months Vitals Screenings to include cognitive, depression, and falls Referrals and appointments  In addition, I have reviewed and discussed with patient certain preventive protocols,  quality metrics, and best practice recommendations. A written personalized care plan for preventive services as well as general preventive health recommendations were provided to patient.     Hal Hope, LPN   [id number]/[id number]   After Visit Summary: (MyChart) Due to this being a telephonic visit, the after visit summary with patients personalized plan was offered to patient via MyChart   Nurse Notes: NONE

## 2023-06-11 NOTE — Patient Instructions (Addendum)
Alyssa Lawson , Thank you for taking time to come for your Medicare Wellness Visit. I appreciate your ongoing commitment to your health goals. Please review the following plan we discussed and let me know if I can assist you in the future.   Referrals/Orders/Follow-Ups/Clinician Recommendations: UACR ORDERED (LAB WORK)  This is a list of the screening recommended for you and due dates:  Health Maintenance  Topic Date Due   COVID-19 Vaccine (7 - 2024-25 season) 02/22/2023   Yearly kidney health urinalysis for diabetes  05/08/2023   Complete foot exam   05/08/2023   Zoster (Shingles) Vaccine (1 of 2) 07/01/2023*   Hemoglobin A1C  09/30/2023   Yearly kidney function blood test for diabetes  10/22/2023   Eye exam for diabetics  11/12/2023   Mammogram  01/08/2024   Medicare Annual Wellness Visit  06/10/2024   DTaP/Tdap/Td vaccine (3 - Td or Tdap) 01/21/2028   Pneumonia Vaccine  Completed   Flu Shot  Completed   DEXA scan (bone density measurement)  Completed   Hepatitis C Screening  Completed   HPV Vaccine  Aged Out   Colon Cancer Screening  Discontinued  *Topic was postponed. The date shown is not the original due date.    Advanced directives: (ACP Link)Information on Advanced Care Planning can be found at [facility] Directives Advance Health Care Directives ([email or web address]   Next Medicare Annual Wellness Visit scheduled for next year: Yes   06/30/24 @ 1:50 PM BY VIDEO

## 2023-06-30 ENCOUNTER — Encounter: Payer: Self-pay | Admitting: Family Medicine

## 2023-06-30 ENCOUNTER — Other Ambulatory Visit: Payer: Self-pay | Admitting: Cardiovascular Disease

## 2023-06-30 DIAGNOSIS — I10 Essential (primary) hypertension: Secondary | ICD-10-CM

## 2023-06-30 DIAGNOSIS — F419 Anxiety disorder, unspecified: Secondary | ICD-10-CM

## 2023-07-03 ENCOUNTER — Telehealth: Payer: Self-pay | Admitting: *Deleted

## 2023-07-03 ENCOUNTER — Other Ambulatory Visit: Payer: Self-pay | Admitting: Cardiovascular Disease

## 2023-07-03 DIAGNOSIS — I10 Essential (primary) hypertension: Secondary | ICD-10-CM

## 2023-07-03 MED ORDER — OLMESARTAN MEDOXOMIL 40 MG PO TABS
40.0000 mg | ORAL_TABLET | Freq: Every day | ORAL | 0 refills | Status: DC
Start: 2023-07-03 — End: 2023-08-28

## 2023-07-03 NOTE — Telephone Encounter (Signed)
 Requested Prescriptions   Signed Prescriptions Disp Refills   olmesartan (BENICAR) 40 MG tablet 90 tablet 0    Sig: Take 1 tablet (40 mg total) by mouth daily.    Authorizing Provider: Antonieta Iba    Ordering User: Kendrick Fries

## 2023-07-03 NOTE — Telephone Encounter (Signed)
 No further action needed at this time.

## 2023-07-08 ENCOUNTER — Ambulatory Visit: Payer: Medicare Other | Attending: Nurse Practitioner | Admitting: Nurse Practitioner

## 2023-07-08 ENCOUNTER — Encounter: Payer: Self-pay | Admitting: Nurse Practitioner

## 2023-07-08 VITALS — BP 188/90 | HR 66 | Ht 65.0 in | Wt 163.8 lb

## 2023-07-08 DIAGNOSIS — E118 Type 2 diabetes mellitus with unspecified complications: Secondary | ICD-10-CM

## 2023-07-08 DIAGNOSIS — Z794 Long term (current) use of insulin: Secondary | ICD-10-CM | POA: Diagnosis not present

## 2023-07-08 DIAGNOSIS — I471 Supraventricular tachycardia, unspecified: Secondary | ICD-10-CM | POA: Diagnosis not present

## 2023-07-08 DIAGNOSIS — I10 Essential (primary) hypertension: Secondary | ICD-10-CM

## 2023-07-08 DIAGNOSIS — E785 Hyperlipidemia, unspecified: Secondary | ICD-10-CM

## 2023-07-08 DIAGNOSIS — I340 Nonrheumatic mitral (valve) insufficiency: Secondary | ICD-10-CM | POA: Diagnosis not present

## 2023-07-08 DIAGNOSIS — I4719 Other supraventricular tachycardia: Secondary | ICD-10-CM | POA: Diagnosis not present

## 2023-07-08 MED ORDER — CLONIDINE HCL 0.1 MG PO TABS
0.1000 mg | ORAL_TABLET | Freq: Two times a day (BID) | ORAL | 1 refills | Status: DC
Start: 2023-07-08 — End: 2023-10-15

## 2023-07-08 NOTE — Progress Notes (Signed)
 Office Visit    Patient Name: Alyssa Lawson Date of Encounter: 07/08/2023  Primary Care Provider:  Sowles, Krichna, MD Primary Cardiologist:  Belva Boyden, MD  Chief Complaint    78 y.o. female with a history of chest pain normal coronary arteries, hypertension, hyperlipidemia, diabetes, atrial tachycardia, mild to moderate mitral regurgitation, diastolic dysfunction, and anxiety, who presents for follow-up related to palpitations.   Past Medical History  Subjective   Past Medical History:  Diagnosis Date   Allergy    Anemia    Anxiety 12/04/2021   Arthritis    Atrial tachycardia (HCC)    Caregiver burden    taking care of husband with dementia   Cervical cancer (HCC)    Chest pain    a. 2018 False+ stress test; b. 04/2017 Cath: Nl cors, EF 55-65%, mod MR.   Chronic heart failure with preserved ejection fraction (HFpEF) (HCC)    a. 10/2022 Echo: EF 55-60%, no rwma, sev asymm LVH of basal-septal segment, GrI DD. Nl RV fxn. Mild-mod MR, Mild AI.   Chronic lower back pain    DDD (degenerative disc disease), cervical    Diastolic dysfunction    Diet-controlled type 2 diabetes mellitus (HCC)    Endometriosis    GERD (gastroesophageal reflux disease)    Hyperlipidemia    Hypertension    Mitral regurgitation    a. TTE 05/01/2017: EF 55-60%, no RWMA, mild-mod MR, mildly dilated LA, RVSF norm, PASP 35 mmHg, sm-mod pericardial effusion; b. 10/2022 Echo: EF 55-60%, no rwma, GrI DD, nl RV fxn, sev dil LA, mildly dil RA, sm pericardial eff, mild-mod MR, mild AI.   PSVT (paroxysmal supraventricular tachycardia) (HCC)    a. 02/2023 Zio: Predominantly sinus rhythm, average rate 68.  8 brief runs of nonsustained VT.  129 brief runs of SVT.   Restless leg syndrome    a.) on pramipexole    Unstable angina (HCC)    Urinary incontinence    Past Surgical History:  Procedure Laterality Date   ABDOMINAL HYSTERECTOMY  1976   CARDIAC CATHETERIZATION     CARPOMETACARPAL (CMC) FUSION OF  THUMB Right 07/23/2022   Procedure: RIGHT THUMB CMC SUSPENSION ARTHROPLASTY;  Surgeon: Elner Hahn, MD;  Location: [facility] ORS;  Service: Orthopedics;  Laterality: Right;   LEFT HEART CATH AND CORONARY ANGIOGRAPHY N/A 05/01/2017   Procedure: LEFT HEART CATH AND CORONARY ANGIOGRAPHY;  Surgeon: Devorah Fonder, MD;  Location: [facility] INVASIVE CV LAB;  Service: Cardiovascular;  Laterality: N/A;   OOPHORECTOMY      Allergies  Allergies  Allergen Reactions   Amlodipine  Swelling   Meloxicam  Palpitations      History of Present Illness      78 y.o. y/o female with a history of chest pain normal coronary arteries, hypertension, hyperlipidemia, diabetes, atrial tachycardia, mild to moderate mitral regurgitation, diastolic dysfunction, and anxiety.  She previously underwent evaluation for chest pain in November 2018.  Echo showed normal LV function.  She had an abnormal stress test for, this was followed by diagnostic catheterization which showed normal coronary arteries, normal LV function, and moderate MR.  Repeat echo May 2024 in the setting of hypertension and frequent PACs, continued to show an EF of 55 to 60% without regional wall motion abnormalities, grade 1 diastolic dysfunction, severely dilated left atrium, small pericardial effusion, mild to moderate MR, and mild AI.    At her last office visit, Alyssa Lawson noted some progression of baseline dyspnea on exertion and also occasional palpitations.  She was hypertensive but wished to defer any medicine changes.  ZIO monitor was placed and showed predominantly sinus rhythm at 68 with 8 brief runs of nonsustained VT and 129 brief runs of PSVT.  Carvedilol  was continued.  Today, she reports feeling well.  She denies chest pain, dyspnea, palpitations, PND, orthopnea, dizziness, syncope, edema, or early satiety.  She checks her blood pressure at home and is frequently in the 140s to 150s.  She is 183/86 today.  She has been compliant with her  medications. Objective  Home Medications    Current Outpatient Medications  Medication Sig Dispense Refill   acetaminophen  (TYLENOL ) 500 MG tablet Take 1 tablet (500 mg total) by mouth every 6 (six) hours as needed for headache. 30 tablet 0   aspirin  81 MG chewable tablet Chew 1 tablet by mouth at bedtime.     atorvastatin  (LIPITOR) 40 MG tablet TAKE 1 TABLET BY MOUTH DAILY 100 tablet 2   busPIRone  (BUSPAR ) 5 MG tablet TAKE 1 TABLET(5 MG) BY MOUTH TWICE DAILY AS NEEDED 180 tablet 1   carvedilol  (COREG ) 25 MG tablet TAKE 1 TABLET BY MOUTH TWICE  DAILY WITH MEALS 60 tablet 11   celecoxib (CELEBREX) 100 MG capsule Take 100 mg by mouth 2 (two) times daily.     Cholecalciferol (VITAMIN D) 50 MCG (2000 UT) CAPS Take 1 capsule by mouth daily.     Cyanocobalamin (VITAMIN B-12) 2000 MCG TBCR Take 1 tablet by mouth daily.     ferrous sulfate 325 (65 FE) MG tablet Take 325 mg by mouth daily with breakfast.     hydrALAZINE  (APRESOLINE ) 25 MG tablet TAKE 1 TABLET BY MOUTH 3 TIMES  DAILY 270 tablet 0   isosorbide  mononitrate (IMDUR ) 30 MG 24 hr tablet TAKE 1 TABLET BY MOUTH DAILY  AFTER LUNCH AT 3PM 90 tablet 3   MAGNESIUM-ZINC PO Take 1 tablet by mouth daily.     Multiple Vitamin (MULTIVITAMIN) capsule Take 1 capsule by mouth daily.     olmesartan  (BENICAR ) 40 MG tablet Take 1 tablet (40 mg total) by mouth daily. 90 tablet 0   omeprazole (PRILOSEC OTC) 20 MG tablet Take 20 mg by mouth as needed.     oxybutynin  (DITROPAN -XL) 10 MG 24 hr tablet Take 1 tablet (10 mg total) by mouth at bedtime. 90 tablet 0   pramipexole  (MIRAPEX ) 0.5 MG tablet TAKE 1 TABLET(0.5 MG) BY MOUTH AT BEDTIME AS NEEDED 100 tablet 0   vitamin C (ASCORBIC ACID) 500 MG tablet Take 500 mg by mouth daily.     cloNIDine  (CATAPRES ) 0.1 MG tablet Take 1 tablet (0.1 mg total) by mouth 2 (two) times daily. 180 tablet 1   valACYclovir  (VALTREX ) 500 MG tablet Take 1 tablet (500 mg total) by mouth as needed.     No current  facility-administered medications for this visit.     Physical Exam    VS:  BP (!) 188/90   Pulse 66   Ht 5\' 5"  (1.651 m)   Wt 163 lb 12.8 oz (74.3 kg)   SpO2 97%   BMI 27.26 kg/m  , BMI Body mass index is 27.26 kg/m.     Vitals:   07/08/23 1351 07/08/23 1440  BP: (!) 183/86 (!) 188/90  Pulse: 66   SpO2: 97%       GEN: Well nourished, well developed, in no acute distress. HEENT: normal. Neck: Supple, no JVD, carotid bruits, or masses. Cardiac: RRR, 2/6 systolic murmur throughout.  No rubs or  gallops. No clubbing, cyanosis, edema.  Radials 2+/PT 2+ and equal bilaterally.  Respiratory:  Respirations regular and unlabored, clear to auscultation bilaterally. GI: Soft, nontender, nondistended, BS + x 4. MS: no deformity or atrophy. Skin: warm and dry, no rash. Neuro:  Strength and sensation are intact. Psych: Normal affect.  Accessory Clinical Findings    ECG personally reviewed by me today - EKG Interpretation Date/Time:  Wednesday July 08 2023 13:55:52 EST Ventricular Rate:  66 PR Interval:  164 QRS Duration:  102 QT Interval:  432 QTC Calculation: 452 R Axis:   -51  Text Interpretation: Sinus rhythm with Premature supraventricular complexes Possible Left atrial enlargement Left axis deviation Minimal voltage criteria for LVH, may be normal variant ( Cornell product ) Septal infarct (cited on or before 21-Jul-2022) T wave abnormality, consider lateral ischemia When compared with ECG of 21-Jul-2022 14:01, T wave inversion more evident in Lateral leads Confirmed by Laneta Pintos [phone]) on 07/08/2023 2:16:45 PM  - no acute changes.  Lab Results  Component Value Date   WBC 8.0 10/22/2022   HGB 13.5 10/22/2022   HCT 41.0 10/22/2022   MCV 91.5 10/22/2022   PLT 218 10/22/2022   Lab Results  Component Value Date   CREATININE 0.78 10/22/2022   BUN 20 10/22/2022   NA 138 10/22/2022   K 3.8 10/22/2022   CL 105 10/22/2022   CO2 23 10/22/2022   Lab Results   Component Value Date   ALT 21 05/07/2022   AST 22 05/07/2022   ALKPHOS 56 08/29/2016   BILITOT 0.7 05/07/2022   Lab Results  Component Value Date   CHOL 132 05/07/2022   HDL 65 05/07/2022   LDLCALC 54 05/07/2022   TRIG 53 05/07/2022   CHOLHDL 2.0 05/07/2022    Lab Results  Component Value Date   HGBA1C 5.9 (A) 04/01/2023   Lab Results  Component Value Date   TSH 1.15 02/25/2017       Assessment & Plan    1.  Primary hypertension: Blood pressure has been running in the 140s to 150s at home despite compliance with her medications.  She is in the 180s on 2 consecutive recordings today.  She is on multiple antihypertensives.  She currently only takes clonidine  0.1 mg in the evening, and I will have her start taking this twice daily.  She otherwise remains on carvedilol  25 mg twice daily, hydralazine  25 mg 3 times daily, isosorbide  30 mg in the afternoon, and Benicar  40 mg daily.  She will continue to monitor blood pressures at home and send us  trends via MyChart.  Otherwise, we will plan to see her back in 4 to 6 weeks.  2.  Atrial tachycardia/PSVT: Event monitoring in September 2024 showed predominantly sinus rhythm at an average rate of 68 bpm with 8 brief runs of nonsustained VT and 129 brief runs of SVT.  All events were asymptomatic.  She remains on beta-blocker therapy.  3.  Hyperlipidemia: LDL of 54 in November 2023.  She remains on statin therapy.  She plans to have follow-up lab work with her primary care provider at her annual visit.  4.  Type 2 diabetes mellitus: A1c was 5.9 in October.  This is down from 6.5 earlier in the year.  She is diet controlled.  She is also on ARB and statin therapy.  5.  Mild to moderate mitral regurgitation: Stable by echocardiogram in 2024.  Plan for follow-up in the next 1 to 2 years.  6.  Disposition: Follow-up in 4 to 6 weeks to reevaluate blood pressure.  Laneta Pintos, NP 07/08/2023, 5:03 PM

## 2023-07-08 NOTE — Patient Instructions (Signed)
 Medication Instructions:  INCREASE the Clonidine  to 0.1 mg twice daily  *If you need a refill on your cardiac medications before your next appointment, please call your pharmacy*   Lab Work: None ordered If you have labs (blood work) drawn today and your tests are completely normal, you will receive your results only by: MyChart Message (if you have MyChart) OR A paper copy in the mail If you have any lab test that is abnormal or we need to change your treatment, we will call you to review the results.   Testing/Procedures: None ordered   Follow-Up: At [facility], you and your health needs are our priority.  As part of our continuing mission to provide you with exceptional heart care, we have created designated Provider Care Teams.  These Care Teams include your primary Cardiologist (physician) and Advanced Practice Providers (APPs -  Physician Assistants and Nurse Practitioners) who all work together to provide you with the care you need, when you need it.  We recommend signing up for the patient portal called "MyChart".  Sign up information is provided on this After Visit Summary.  MyChart is used to connect with patients for Virtual Visits (Telemedicine).  Patients are able to view lab/test results, encounter notes, upcoming appointments, etc.  Non-urgent messages can be sent to your provider as well.   To learn more about what you can do with MyChart, go to [email or web address].    Your next appointment:   1 month(s)  Provider:   Laneta Pintos, NP

## 2023-07-17 ENCOUNTER — Other Ambulatory Visit: Payer: Self-pay

## 2023-07-17 MED ORDER — ISOSORBIDE MONONITRATE ER 30 MG PO TB24: ORAL_TABLET | ORAL | 0 refills | Status: DC

## 2023-07-17 NOTE — Telephone Encounter (Signed)
This is a Educational psychologist pt

## 2023-07-28 ENCOUNTER — Other Ambulatory Visit: Payer: Self-pay | Admitting: Family Medicine

## 2023-07-28 DIAGNOSIS — N3941 Urge incontinence: Secondary | ICD-10-CM

## 2023-07-30 NOTE — Telephone Encounter (Signed)
 Requested Prescriptions  Pending Prescriptions Disp Refills   oxybutynin  (DITROPAN -XL) 10 MG 24 hr tablet [Pharmacy Med Name: Oxybutynin  Chloride ER 10 MG Oral Tablet Extended Release 24 Hour] 90 tablet 1    Sig: TAKE 1 TABLET BY MOUTH AT  BEDTIME     Urology:  Bladder Agents Passed - 07/30/2023 11:34 AM      Passed - Valid encounter within last 12 months    Recent Outpatient Visits           1 month ago Encounter for [employer] annual wellness exam   [facility] Gwenn Heinrich S, LPN   4 months ago Dyslipidemia associated with type 2 diabetes mellitus [facility])   [facility] [place], [place]orette, MD   5 months ago Acute right-sided low back pain with right-sided sciatica   [facility] Gareth Clarity F, FNP   5 months ago Acute right-sided low back pain with right-sided sciatica   [facility] Gareth Clarity FALCON, FNP   8 months ago Dyslipidemia associated with type 2 diabetes mellitus [facility])   [facility] Sowles, Krichna, MD       Future Appointments             In 1 week Vivienne, Lonni Ingle, NP [facility]

## 2023-08-11 ENCOUNTER — Encounter: Payer: Self-pay | Admitting: Nurse Practitioner

## 2023-08-11 ENCOUNTER — Ambulatory Visit: Payer: Medicare Other | Attending: Nurse Practitioner | Admitting: Nurse Practitioner

## 2023-08-11 VITALS — BP 176/80 | HR 59 | Ht 65.0 in | Wt 163.6 lb

## 2023-08-11 DIAGNOSIS — I10 Essential (primary) hypertension: Secondary | ICD-10-CM | POA: Diagnosis not present

## 2023-08-11 DIAGNOSIS — E785 Hyperlipidemia, unspecified: Secondary | ICD-10-CM | POA: Diagnosis not present

## 2023-08-11 DIAGNOSIS — I471 Supraventricular tachycardia, unspecified: Secondary | ICD-10-CM | POA: Diagnosis not present

## 2023-08-11 DIAGNOSIS — E118 Type 2 diabetes mellitus with unspecified complications: Secondary | ICD-10-CM

## 2023-08-11 DIAGNOSIS — I4719 Other supraventricular tachycardia: Secondary | ICD-10-CM | POA: Diagnosis not present

## 2023-08-11 DIAGNOSIS — Z794 Long term (current) use of insulin: Secondary | ICD-10-CM | POA: Diagnosis not present

## 2023-08-11 NOTE — Patient Instructions (Signed)
 Medication Instructions:  No changes at this time.   *If you need a refill on your cardiac medications before your next appointment, please call your pharmacy*   Lab Work: None  If you have labs (blood work) drawn today and your tests are completely normal, you will receive your results only by: MyChart Message (if you have MyChart) OR A paper copy in the mail If you have any lab test that is abnormal or we need to change your treatment, we will call you to review the results.   Testing/Procedures: None   Follow-Up: At [facility], you and your health needs are our priority.  As part of our continuing mission to provide you with exceptional heart care, we have created designated Provider Care Teams.  These Care Teams include your primary Cardiologist (physician) and Advanced Practice Providers (APPs -  Physician Assistants and Nurse Practitioners) who all work together to provide you with the care you need, when you need it.   Your next appointment:   6 month(s)  Provider:   Julien Nordmann, MD or Nicolasa Ducking, NP

## 2023-08-11 NOTE — Progress Notes (Signed)
 Office Visit    Patient Name: Alyssa Lawson Date of Encounter: 08/11/2023  Primary Care Provider:  Alba Cory, MD Primary Cardiologist:  Julien Nordmann, MD  Chief Complaint    78 y.o. female with a history of chest pain normal coronary arteries, hypertension, hyperlipidemia, diabetes, atrial tachycardia, mild to moderate mitral regurgitation, diastolic dysfunction, and anxiety, who presents for follow-up.  Past Medical History  Subjective   Past Medical History:  Diagnosis Date   Allergy    Anemia    Anxiety 12/04/2021   Arthritis    Atrial tachycardia (HCC)    Caregiver burden    taking care of husband with dementia   Cervical cancer (HCC)    Chest pain    a. 2018 False+ stress test; b. 04/2017 Cath: Nl cors, EF 55-65%, mod MR.   Chronic heart failure with preserved ejection fraction (HFpEF) (HCC)    a. 10/2022 Echo: EF 55-60%, no rwma, sev asymm LVH of basal-septal segment, GrI DD. Nl RV fxn. Mild-mod MR, Mild AI.   Chronic lower back pain    DDD (degenerative disc disease), cervical    Diastolic dysfunction    Diet-controlled type 2 diabetes mellitus (HCC)    Endometriosis    GERD (gastroesophageal reflux disease)    Hyperlipidemia    Hypertension    Mitral regurgitation    a. TTE 05/01/2017: EF 55-60%, no RWMA, mild-mod MR, mildly dilated LA, RVSF norm, PASP 35 mmHg, sm-mod pericardial effusion; b. 10/2022 Echo: EF 55-60%, no rwma, GrI DD, nl RV fxn, sev dil LA, mildly dil RA, sm pericardial eff, mild-mod MR, mild AI.   PSVT (paroxysmal supraventricular tachycardia) (HCC)    a. 02/2023 Zio: Predominantly sinus rhythm, average rate 68.  8 brief runs of nonsustained VT.  129 brief runs of SVT.   Restless leg syndrome    a.) on pramipexole   Unstable angina (HCC)    Urinary incontinence    Past Surgical History:  Procedure Laterality Date   ABDOMINAL HYSTERECTOMY  1976   CARDIAC CATHETERIZATION     CARPOMETACARPAL (CMC) FUSION OF THUMB Right 07/23/2022    Procedure: RIGHT THUMB CMC SUSPENSION ARTHROPLASTY;  Surgeon: Christena Flake, MD;  Location: [facility] ORS;  Service: Orthopedics;  Laterality: Right;   LEFT HEART CATH AND CORONARY ANGIOGRAPHY N/A 05/01/2017   Procedure: LEFT HEART CATH AND CORONARY ANGIOGRAPHY;  Surgeon: Antonieta Iba, MD;  Location: [facility] INVASIVE CV LAB;  Service: Cardiovascular;  Laterality: N/A;   OOPHORECTOMY      Allergies  Allergies  Allergen Reactions   Amlodipine Swelling   Meloxicam Palpitations      History of Present Illness      78 y.o. y/o female with a history of chest pain normal coronary arteries, hypertension, hyperlipidemia, diabetes, atrial tachycardia, mild to moderate mitral regurgitation, diastolic dysfunction, and anxiety.  She previously underwent evaluation for chest pain in November 2018.  Echo showed normal LV function.  She had an abnormal stress test for, this was followed by diagnostic catheterization which showed normal coronary arteries, normal LV function, and moderate MR.  Repeat echo May 2024 in the setting of hypertension and frequent PACs, continued to show an EF of 55 to 60% without regional wall motion abnormalities, grade 1 diastolic dysfunction, severely dilated left atrium, small pericardial effusion, mild to moderate MR, and mild AI.    Patient was seen in clinic in August of 2024 when she states to have occasional fluttering and palpitations, occurring several nights per week  lasting a few minutes, resolving spontaneously. ZIO monitor was placed and showed predominantly sinus rhythm at 68 with 8 brief runs of nonsustained VT and 129 brief runs of PSVT. Carvedilol was continued.    At clinic visit in January, blood pressure was 180s on 2 consecutive recordings. Added clonidine 0.1 in the morning in addition to her clonidine 0.1 at night. She remained on carvedilol 25 mg twice daily, hydralazine 25 mg 3 times daily, isosorbide 30 mg in the afternoon, and Benicar 40 mg daily   Patient  today reports feeling well. She denies chest pain, dyspnea, palpitations, PND, orthopnea, dizziness, syncope, edema, or early satiety. She checks her blood pressure at home and is frequently in the 130s to 150s. She is 180/80s today but pressure was in the 130's this AM. She has been compliant with her medications.    Objective  Home Medications    Current Outpatient Medications  Medication Sig Dispense Refill   acetaminophen (TYLENOL) 500 MG tablet Take 1 tablet (500 mg total) by mouth every 6 (six) hours as needed for headache. 30 tablet 0   aspirin 81 MG chewable tablet Chew 1 tablet by mouth at bedtime.     atorvastatin (LIPITOR) 40 MG tablet TAKE 1 TABLET BY MOUTH DAILY 100 tablet 2   busPIRone (BUSPAR) 5 MG tablet TAKE 1 TABLET(5 MG) BY MOUTH TWICE DAILY AS NEEDED 180 tablet 1   carvedilol (COREG) 25 MG tablet TAKE 1 TABLET BY MOUTH TWICE  DAILY WITH MEALS 60 tablet 11   celecoxib (CELEBREX) 100 MG capsule Take 100 mg by mouth 2 (two) times daily.     Cholecalciferol (VITAMIN D) 50 MCG (2000 UT) CAPS Take 1 capsule by mouth daily.     cloNIDine (CATAPRES) 0.1 MG tablet Take 1 tablet (0.1 mg total) by mouth 2 (two) times daily. 180 tablet 1   Cyanocobalamin (VITAMIN B-12) 2000 MCG TBCR Take 1 tablet by mouth daily.     ferrous sulfate 325 (65 FE) MG tablet Take 325 mg by mouth daily with breakfast.     hydrALAZINE (APRESOLINE) 25 MG tablet TAKE 1 TABLET BY MOUTH 3 TIMES  DAILY 270 tablet 0   isosorbide mononitrate (IMDUR) 30 MG 24 hr tablet TAKE 1 TABLET BY MOUTH DAILY  AFTER LUNCH AT 3PM 90 tablet 0   MAGNESIUM-ZINC PO Take 1 tablet by mouth daily.     Multiple Vitamin (MULTIVITAMIN) capsule Take 1 capsule by mouth daily.     olmesartan (BENICAR) 40 MG tablet Take 1 tablet (40 mg total) by mouth daily. 90 tablet 0   omeprazole (PRILOSEC OTC) 20 MG tablet Take 20 mg by mouth as needed.     oxybutynin (DITROPAN-XL) 10 MG 24 hr tablet TAKE 1 TABLET BY MOUTH AT  BEDTIME 90 tablet 1    pramipexole (MIRAPEX) 0.5 MG tablet TAKE 1 TABLET(0.5 MG) BY MOUTH AT BEDTIME AS NEEDED 100 tablet 0   vitamin C (ASCORBIC ACID) 500 MG tablet Take 500 mg by mouth daily.     No current facility-administered medications for this visit.     Physical Exam    VS:  BP (!) 178/84 (BP Location: Left Arm, Patient Position: Sitting, Cuff Size: Normal)   Pulse (!) 59   Ht 5\' 5"  (1.651 m)   Wt 163 lb 9.6 oz (74.2 kg)   SpO2 97%   BMI 27.22 kg/m  , BMI Body mass index is 27.22 kg/m.   Vitals:   08/11/23 1410 08/11/23 1654  BP: Marland Kitchen)  178/84 (!) 176/80  Pulse: (!) 59   SpO2: 97%         GEN: Well nourished, well developed, in no acute distress. HEENT: normal. Neck: Supple, no JVD, carotid bruits, or masses. Cardiac: RRR, 2/6 systolic murmur heard @ upper sternal borders. No clubbing, cyanosis, edema.  Radials 2+/PT 2+ and equal bilaterally.  Respiratory:  Respirations regular and unlabored, clear to auscultation bilaterally. GI: Soft, nontender, nondistended, BS + x 4. MS: no deformity or atrophy. Skin: warm and dry, no rash. Neuro:  Strength and sensation are intact. Psych: Normal affect.  Accessory Clinical Findings    Lab Results  Component Value Date   WBC 8.0 10/22/2022   HGB 13.5 10/22/2022   HCT 41.0 10/22/2022   MCV 91.5 10/22/2022   PLT 218 10/22/2022   Lab Results  Component Value Date   CREATININE 0.78 10/22/2022   BUN 20 10/22/2022   NA 138 10/22/2022   K 3.8 10/22/2022   CL 105 10/22/2022   CO2 23 10/22/2022   Lab Results  Component Value Date   ALT 21 05/07/2022   AST 22 05/07/2022   ALKPHOS 56 08/29/2016   BILITOT 0.7 05/07/2022   Lab Results  Component Value Date   CHOL 132 05/07/2022   HDL 65 05/07/2022   LDLCALC 54 05/07/2022   TRIG 53 05/07/2022   CHOLHDL 2.0 05/07/2022    Lab Results  Component Value Date   HGBA1C 5.9 (A) 04/01/2023   Lab Results  Component Value Date   TSH 1.15 02/25/2017       Assessment & Plan    1. Primary  hypertension: Blood pressure has been running in the 130s to 150s at home despite compliance with her medications. Blood pressure was in 180s on 2 consecutive recordings on previous visit in January. Added an additional 0.1 mg of clonidine to her regimen. She otherwise remains on carvedilol 25 mg twice daily, hydralazine 25 mg 3 times daily, isosorbide 30 mg in the afternoon, and Benicar 40 mg daily.  She is 170s/80s today. Patient is adamant that pressures are good at home - 130 this AM (has pic of cuff on her phone). Patient to bring BP cuff into office next week for repeat BP check - our manual pressure vs her home cuff. Will reassess medications then.   2. Atrial tachycardia/PSVT: Event monitoring in September 2024 showed predominantly sinus rhythm at an average rate of 68 bpm with 8 brief runs of nonsustained VT and 129 brief runs of SVT. All events were asymptomatic. She remains on beta-blocker therapy.   3. Hyperlipidemia: LDL of 54 in November 2023. She remains on statin therapy.   4. Type 2 diabetes mellitus: A1c was 5.9 in October. This is down from 6.5 earlier in the year. She is diet controlled. She is also on ARB and statin therapy.   5. Mild to moderate mitral regurgitation: Stable by echocardiogram in 2024. Plan for follow-up in the next 1 to 2 years.   6. Disposition: Follow-up in clinic for blood pressure machine check next week.   Nicolasa Ducking, NP 08/11/2023, 4:52 PM

## 2023-08-19 ENCOUNTER — Telehealth: Payer: Self-pay | Admitting: *Deleted

## 2023-08-19 NOTE — Telephone Encounter (Signed)
 Thank you for follow-up.  It appears that home cuff pressures are more or less in line with manual pressures in the office.  As pressures at home have been relatively normal by patient report, no blood pressure medication changes today.  Continue to follow pressures at home, as we discussed when patient was in clinic.  Nicolasa Ducking, NP 08/19/2023, 2:50 PM

## 2023-08-19 NOTE — Telephone Encounter (Signed)
 Patient came in for blood pressure comparison to her home wrist cuff. Home cuff 173/112, manual 160/84. Recheck with home monitor 151/93. She reports taking it in the care and the Systolic was 146. Reviewed and recommended that she purchase a arm cuff instead of a wrist monitor. She was adamant that her blood pressures at home are good and she feels great. Reviewed numbers with APP and patient released and will forward this note over to provider.

## 2023-08-27 ENCOUNTER — Other Ambulatory Visit: Payer: Self-pay | Admitting: Cardiovascular Disease

## 2023-08-27 DIAGNOSIS — I10 Essential (primary) hypertension: Secondary | ICD-10-CM

## 2023-08-29 ENCOUNTER — Other Ambulatory Visit: Payer: Self-pay | Admitting: Cardiovascular Disease

## 2023-08-29 DIAGNOSIS — I10 Essential (primary) hypertension: Secondary | ICD-10-CM

## 2023-09-15 ENCOUNTER — Other Ambulatory Visit: Payer: Self-pay | Admitting: Cardiovascular Disease

## 2023-09-29 ENCOUNTER — Other Ambulatory Visit: Payer: Self-pay | Admitting: Family Medicine

## 2023-09-29 DIAGNOSIS — G2581 Restless legs syndrome: Secondary | ICD-10-CM

## 2023-10-13 ENCOUNTER — Other Ambulatory Visit: Payer: Self-pay | Admitting: Family Medicine

## 2023-10-13 ENCOUNTER — Other Ambulatory Visit: Payer: Self-pay | Admitting: Nurse Practitioner

## 2023-10-13 DIAGNOSIS — E785 Hyperlipidemia, unspecified: Secondary | ICD-10-CM

## 2023-10-13 DIAGNOSIS — I10 Essential (primary) hypertension: Secondary | ICD-10-CM

## 2023-10-13 DIAGNOSIS — N3941 Urge incontinence: Secondary | ICD-10-CM

## 2023-10-30 ENCOUNTER — Ambulatory Visit: Admitting: Family Medicine

## 2023-11-04 ENCOUNTER — Ambulatory Visit (INDEPENDENT_AMBULATORY_CARE_PROVIDER_SITE_OTHER): Admitting: Family Medicine

## 2023-11-04 VITALS — BP 136/78 | HR 62 | Resp 16 | Ht 65.0 in | Wt 160.3 lb

## 2023-11-04 DIAGNOSIS — G2581 Restless legs syndrome: Secondary | ICD-10-CM

## 2023-11-04 DIAGNOSIS — E1169 Type 2 diabetes mellitus with other specified complication: Secondary | ICD-10-CM

## 2023-11-04 DIAGNOSIS — G8929 Other chronic pain: Secondary | ICD-10-CM

## 2023-11-04 DIAGNOSIS — I10 Essential (primary) hypertension: Secondary | ICD-10-CM | POA: Diagnosis not present

## 2023-11-04 DIAGNOSIS — I200 Unstable angina: Secondary | ICD-10-CM

## 2023-11-04 DIAGNOSIS — M545 Low back pain, unspecified: Secondary | ICD-10-CM | POA: Diagnosis not present

## 2023-11-04 DIAGNOSIS — F419 Anxiety disorder, unspecified: Secondary | ICD-10-CM

## 2023-11-04 DIAGNOSIS — I4719 Other supraventricular tachycardia: Secondary | ICD-10-CM

## 2023-11-04 DIAGNOSIS — E785 Hyperlipidemia, unspecified: Secondary | ICD-10-CM

## 2023-11-04 DIAGNOSIS — I471 Supraventricular tachycardia, unspecified: Secondary | ICD-10-CM

## 2023-11-04 DIAGNOSIS — M19041 Primary osteoarthritis, right hand: Secondary | ICD-10-CM

## 2023-11-04 DIAGNOSIS — Z1231 Encounter for screening mammogram for malignant neoplasm of breast: Secondary | ICD-10-CM

## 2023-11-04 DIAGNOSIS — N3941 Urge incontinence: Secondary | ICD-10-CM

## 2023-11-04 DIAGNOSIS — J302 Other seasonal allergic rhinitis: Secondary | ICD-10-CM

## 2023-11-04 LAB — POCT GLYCOSYLATED HEMOGLOBIN (HGB A1C): Hemoglobin A1C: 6.2 % — AB (ref 4.0–5.6)

## 2023-11-04 MED ORDER — BUSPIRONE HCL 5 MG PO TABS: ORAL_TABLET | ORAL | 1 refills | Status: DC

## 2023-11-04 MED ORDER — CELECOXIB 100 MG PO CAPS: 100.0000 mg | ORAL_CAPSULE | Freq: Two times a day (BID) | ORAL | 0 refills | Status: DC | PRN

## 2023-11-04 MED ORDER — PRAMIPEXOLE DIHYDROCHLORIDE 0.5 MG PO TABS: 0.5000 mg | ORAL_TABLET | Freq: Every evening | ORAL | 1 refills | Status: DC

## 2023-11-04 MED ORDER — OMEPRAZOLE 20 MG PO CPDR: 20.0000 mg | DELAYED_RELEASE_CAPSULE | Freq: Every day | ORAL | 1 refills | Status: DC

## 2023-11-04 NOTE — Progress Notes (Signed)
 Name: Alyssa Lawson   MRN: [id number]    DOB: Nov 20, 1945   Date:11/04/2023       Progress Note  Subjective  Chief Complaint  Chief Complaint  Patient presents with   Medical Management of Chronic Issues   Discussed the use of AI scribe software for clinical note transcription with the patient, who gave verbal consent to proceed.  History of Present Illness Alyssa Lawson "Alyssa Lawson" is a 78 year old female with hypertension and coronary artery disease who presents for a follow-up visit.  She has hypertension with home blood pressure readings of 122/60 mmHg, but significantly higher readings in the cardiologist's office, such as 178/unknown, attributed to nervousness during office visits. Her current medications include carvedilol  25 mg twice daily, hydralazine  25 mg three times daily, and clonidine  as instructed. She adjusts hydralazine  if readings exceed 140 mmHg.  She has a history of angina managed with isosorbide  and reports no pain or tightness. She also has a history of supraventricular tachycardia and atrial tachycardia, diagnosed via Holter monitor, without symptoms of palpitations or rapid heart rate.  She experiences restless leg syndrome and takes pramipexole  at night, which helps calm her legs. For reflux, she takes omeprazole as needed and experiences occasional heartburn and indigestion.  She has stress and urge incontinence, taking oxybutynin  10 mg. She describes urgency and occasional incontinence, particularly when delaying urination due to other activities.  She has a history of anxiety and previously took buspirone  as needed, but currently has none left. She wants to resume this medication as needed.  She has arthritis in her hands and chronic low back pain without sciatica. She previously took Celebrex but has not had a refill since 2023. She uses Tylenol  for pain management and reserves Celebrex for occasional use when needed.  She has a history of cervical cancer with a  complete hysterectomy performed in 1982, with no current issues related to this history.  She reports an A1c of 6.2% and manages her diabetes with diet. She is due for a shingles vaccine and has an upcoming eye exam and mammogram.  She has seasonal allergies, exacerbated by outdoor activities without a mask, leading to dry mouth and throat discomfort.    Patient Active Problem List   Diagnosis Date Noted   Primary osteoarthritis of both hands 12/04/2021   Primary osteoarthritis of first carpometacarpal joint of right hand 12/04/2021   Urge incontinence 12/04/2021   Anxiety 12/04/2021   Dyslipidemia associated with type 2 diabetes mellitus (HCC) 12/04/2021   Senile purpura (HCC) 04/05/2021   Cervical radiculitis 04/05/2021   DDD (degenerative disc disease), cervical 04/05/2021   Chronic pain of right thumb 04/05/2021   Type 2 diabetes mellitus with complication, with long-term current use of insulin  (HCC) 11/09/2018   Atrial tachycardia (HCC) 08/25/2017   Unstable angina (HCC) 05/01/2017   Abnormal EKG 04/24/2017   Smoking history 04/24/2017   Rotator cuff tendinitis, left 04/10/2017   Cataracts, bilateral 11/25/2016   Impingement syndrome of shoulder, left 10/13/2016   Chronic bilateral low back pain without sciatica 08/20/2016   Mitral regurgitation 06/22/2015   Melasma 12/21/2014   History of hysterectomy 12/21/2014   Restless leg syndrome 12/21/2014   Primary hypertension 12/17/2014   Cataract 12/17/2014   Dyslipidemia 12/17/2014   Gastric reflux 12/17/2014   History of cervical cancer 12/17/2014   H/O iron deficiency anemia 12/17/2014   TI (tricuspid incompetence) 12/17/2014   Osteopenia of the elderly 12/17/2014    Past Surgical History:  Procedure Laterality  Date   ABDOMINAL HYSTERECTOMY  1976   CARDIAC CATHETERIZATION     CARPOMETACARPAL (CMC) FUSION OF THUMB Right 07/23/2022   Procedure: RIGHT THUMB CMC SUSPENSION ARTHROPLASTY;  Surgeon: Elner Hahn, MD;   Location: [facility] ORS;  Service: Orthopedics;  Laterality: Right;   LEFT HEART CATH AND CORONARY ANGIOGRAPHY N/A 05/01/2017   Procedure: LEFT HEART CATH AND CORONARY ANGIOGRAPHY;  Surgeon: Devorah Fonder, MD;  Location: [facility] IN[facility];  Service: Cardiovascular;  Laterality: N/A;   OOPHORECTOMY      Family History  Problem Relation Age of Onset   Heart failure Father    Arrhythmia Father    Hypertension Father    Arrhythmia Sister    Hypertension Sister    Congestive Heart Failure Brother    Hypertension Brother    Cardiomyopathy Sister    Diabetes Brother    Breast cancer Paternal Aunt 84    Social History   Tobacco Use   Smoking status: Former    Current packs/day: 0.00    Average packs/day: 0.5 packs/day for 39.4 years (19.7 ttl pk-yrs)    Types: Cigarettes    Start date: 06/23/1956    Quit date: 11/22/1995    Years since quitting: 27.9   Smokeless tobacco: Former    Quit date: 11/22/1996   Tobacco comments:    None  Substance Use Topics   Alcohol use: Yes    Alcohol/week: 1.0 standard drink of alcohol    Types: 1 Glasses of wine per week    Comment: glass of wine occasionally maybe once a month     Current Outpatient Medications:    acetaminophen  (TYLENOL ) 500 MG tablet, Take 1 tablet (500 mg total) by mouth every 6 (six) hours as needed for headache., Disp: 30 tablet, Rfl: 0   aspirin  81 MG chewable tablet, Chew 1 tablet by mouth at bedtime., Disp: , Rfl:    atorvastatin  (LIPITOR) 40 MG tablet, TAKE 1 TABLET BY MOUTH DAILY, Disp: 100 tablet, Rfl: 2   busPIRone  (BUSPAR ) 5 MG tablet, TAKE 1 TABLET(5 MG) BY MOUTH TWICE DAILY AS NEEDED, Disp: 180 tablet, Rfl: 1   carvedilol  (COREG ) 25 MG tablet, TAKE 1 TABLET BY MOUTH TWICE  DAILY WITH MEALS, Disp: 60 tablet, Rfl: 11   celecoxib (CELEBREX) 100 MG capsule, Take 100 mg by mouth 2 (two) times daily., Disp: , Rfl:    Cholecalciferol (VITAMIN D) 50 MCG (2000 UT) CAPS, Take 1 capsule by mouth daily., Disp: , Rfl:     cloNIDine  (CATAPRES ) 0.1 MG tablet, TAKE 1 TABLET BY MOUTH TWICE  DAILY, Disp: 180 tablet, Rfl: 0   Cyanocobalamin (VITAMIN B-12) 2000 MCG TBCR, Take 1 tablet by mouth daily., Disp: , Rfl:    ferrous sulfate 325 (65 FE) MG tablet, Take 325 mg by mouth daily with breakfast., Disp: , Rfl:    hydrALAZINE  (APRESOLINE ) 25 MG tablet, TAKE 1 TABLET BY MOUTH 3 TIMES  DAILY, Disp: 270 tablet, Rfl: 1   isosorbide  mononitrate (IMDUR ) 30 MG 24 hr tablet, TAKE 1 TABLET BY MOUTH DAILY  AFTER LUNCH AT 3 PM, Disp: 90 tablet, Rfl: 3   MAGNESIUM-ZINC PO, Take 1 tablet by mouth daily., Disp: , Rfl:    Multiple Vitamin (MULTIVITAMIN) capsule, Take 1 capsule by mouth daily., Disp: , Rfl:    olmesartan  (BENICAR ) 40 MG tablet, TAKE 1 TABLET BY MOUTH DAILY, Disp: 100 tablet, Rfl: 2   omeprazole (PRILOSEC OTC) 20 MG tablet, Take 20 mg by mouth as needed., Disp: ,  Rfl:    oxybutynin  (DITROPAN -XL) 10 MG 24 hr tablet, TAKE 1 TABLET BY MOUTH AT  BEDTIME, Disp: 90 tablet, Rfl: 1   pramipexole  (MIRAPEX ) 0.5 MG tablet, TAKE 1 TABLET(0.5 MG) BY MOUTH AT BEDTIME AS NEEDED, Disp: 100 tablet, Rfl: 0   vitamin C (ASCORBIC ACID) 500 MG tablet, Take 500 mg by mouth daily., Disp: , Rfl:   Allergies  Allergen Reactions   Amlodipine  Swelling   Meloxicam  Palpitations    I personally reviewed active problem list, medication list, allergies, family history with the patient/caregiver today.   ROS  Ten systems reviewed and is negative except as mentioned in HPI    Objective Physical Exam Constitutional: Patient appears well-developed and well-nourished.  No distress.  HEENT: head atraumatic, normocephalic, pupils equal and reactive to light, neck supple Cardiovascular: Normal rate, regular rhythm and normal heart sounds.  No murmur heard. No BLE edema. Pulmonary/Chest: Effort normal and breath sounds normal. No respiratory distress. Abdominal: Soft.  There is no tenderness. Psychiatric: Patient has a normal mood and affect.  behavior is normal. Judgment and thought content normal.    Vitals:   11/04/23 1357 11/04/23 1417  BP: (!) 142/80 (!) 142/82  Pulse: 62   Resp: 16   SpO2: 97%   Weight: 160 lb 4.8 oz (72.7 kg)   Height: 5\' 5"  (1.651 m)     Body mass index is 26.68 kg/m.  Recent Results (from the past 2160 hours)  POCT glycosylated hemoglobin (Hb A1C)     Status: Abnormal   Collection Time: 11/04/23  2:06 PM  Result Value Ref Range   Hemoglobin A1C 6.2 (A) 4.0 - 5.6 %   HbA1c POC (<> result, manual entry)     HbA1c, POC (prediabetic range)     HbA1c, POC (controlled diabetic range)      Diabetic Foot Exam:  Diabetic foot exam was performed with the following findings:   No deformities, ulcerations, or other skin breakdown Normal sensation of 10g monofilament Intact posterior tibialis and dorsalis pedis pulses      PHQ2/9:    11/04/2023    1:55 PM 06/11/2023    1:49 PM 04/01/2023    2:59 PM 02/16/2023    2:39 PM 02/11/2023    2:19 PM  Depression screen PHQ 2/9  Decreased Interest 0 0 0 0 0  Down, Depressed, Hopeless 0 0 0 0 0  PHQ - 2 Score 0 0 0 0 0  Altered sleeping  0 0 1 0  Tired, decreased energy  0 1 1 0  Change in appetite  0 0 0 0  Feeling bad or failure about yourself   0 0 0 0  Trouble concentrating  0 0 0 0  Moving slowly or fidgety/restless  0 0 0 0  Suicidal thoughts  0 0 0 0  PHQ-9 Score  0 1 2 0  Difficult doing work/chores  Not difficult at all  Not difficult at all Not difficult at all    phq 9 is negative  Fall Risk:    11/04/2023    1:55 PM 06/11/2023    1:51 PM 04/28/2023   10:23 AM 04/01/2023    2:58 PM 02/16/2023    2:39 PM  Fall Risk   Falls in the past year? 0 1 1 1 1   Number falls in past yr: 0 1 1 1  0  Injury with Fall? 0 0 0 0 0  Risk for fall due to : No Fall Risks Impaired mobility  No Fall Risks Impaired balance/gait  Follow up Falls prevention discussed;Education provided;Falls evaluation completed Falls evaluation completed;Falls prevention  discussed  Falls prevention discussed     Assessment & Plan Hypertension Blood pressure well-controlled at home, likely white coat syndrome in office. Compliant with medications. - Continue carvedilol  25 mg twice daily. - Continue hydralazine  25 mg three times daily, with an extra dose if blood pressure exceeds 140 mmHg. - Continue clonidine  as prescribed by Dr. Carolin Chyle. - Recheck blood pressure before leaving the office.  Angina pectoris/Unstable  Angina well-controlled with isosorbide  mononitrate. No current symptoms. - Continue isosorbide  mononitrate as prescribed.  Supraventricular tachycardia (SVT) and atrial tachycardia Diagnosed via Holter monitor. No current symptoms. Carvedilol  may help control heart rate. - Continue carvedilol  as prescribed.  Type 2 diabetes mellitus with dyslipidemia Diabetes well-controlled with lifestyle modifications. Recent A1c is 6.2%. - Order labs to check kidney function and microalbumin due to diabetes.  Hyperlipidemia Managed with atorvastatin . Compliant with medication. - Continue atorvastatin  as prescribed.  Restless legs syndrome Symptoms controlled with pramipexole  at night. - Continue pramipexole  at night. - Provide a six-month supply of pramipexole .  Gastroesophageal reflux disease (GERD) Experiencing some reflux symptoms, not severe. Taking omeprazole as needed. - Prescribe omeprazole 20 mg with a 90-day supply for as-needed use.  Urge incontinence Symptoms partially controlled with oxybutynin . Discussed alternatives, no changes made. - Continue oxybutynin  10 mg as prescribed.  Anxiety Managed with buspirone  as needed. Reports benefit but has run out. - Prescribe buspirone  with instructions to take up to twice daily as needed. - Provide one refill of buspirone .  Chronic low back pain Managed with Tylenol  primarily, Celebrex for occasional use due to side effects. - Prescribe Celebrex for occasional use, not daily. - Advise  using Tylenol  as the primary pain management option.  Cervical cancer Complete hysterectomy performed in 1982. No current issues.  General Health Maintenance Due for shingles vaccine and mammogram. Eye exam upcoming. - Encourage scheduling and receiving the shingles vaccine. - Schedule mammogram in July. - Ensure eye exam is up to date.

## 2023-11-05 LAB — COMPREHENSIVE METABOLIC PANEL WITH GFR
AG Ratio: 1.9 (calc) (ref 1.0–2.5)
ALT: 29 U/L (ref 6–29)
AST: 27 U/L (ref 10–35)
Albumin: 4.4 g/dL (ref 3.6–5.1)
Alkaline phosphatase (APISO): 68 U/L (ref 37–153)
BUN: 20 mg/dL (ref 7–25)
CO2: 28 mmol/L (ref 20–32)
Calcium: 9.7 mg/dL (ref 8.6–10.4)
Chloride: 105 mmol/L (ref 98–110)
Creat: 0.79 mg/dL (ref 0.60–1.00)
Globulin: 2.3 g/dL (ref 1.9–3.7)
Glucose, Bld: 109 mg/dL — ABNORMAL HIGH (ref 65–99)
Potassium: 4.4 mmol/L (ref 3.5–5.3)
Sodium: 140 mmol/L (ref 135–146)
Total Bilirubin: 0.7 mg/dL (ref 0.2–1.2)
Total Protein: 6.7 g/dL (ref 6.1–8.1)
eGFR: 77 mL/min/{1.73_m2} (ref >=60)

## 2023-11-05 LAB — LIPID PANEL
Cholesterol: 151 mg/dL (ref <200)
HDL: 67 mg/dL (ref >=50)
LDL Cholesterol (Calc): 68 mg/dL
Non-HDL Cholesterol (Calc): 84 mg/dL (ref <130)
Total CHOL/HDL Ratio: 2.3 (calc) (ref <5.0)
Triglycerides: 81 mg/dL (ref <150)

## 2023-11-05 LAB — CBC WITH DIFFERENTIAL/PLATELET
Absolute Lymphocytes: 1963 {cells}/uL (ref 850–3900)
Absolute Monocytes: 507 {cells}/uL (ref 200–950)
Basophils Absolute: 39 {cells}/uL (ref 0–200)
Basophils Relative: 0.6 %
Eosinophils Absolute: 228 {cells}/uL (ref 15–500)
Eosinophils Relative: 3.5 %
HCT: 43.4 % (ref 35.0–45.0)
Hemoglobin: 14.2 g/dL (ref 11.7–15.5)
MCH: 30 pg (ref 27.0–33.0)
MCHC: 32.7 g/dL (ref 32.0–36.0)
MCV: 91.8 fL (ref 80.0–100.0)
MPV: 12.5 fL (ref 7.5–12.5)
Monocytes Relative: 7.8 %
Neutro Abs: 3764 {cells}/uL (ref 1500–7800)
Neutrophils Relative %: 57.9 %
Platelets: 197 10*3/uL (ref 140–400)
RBC: 4.73 10*6/uL (ref 3.80–5.10)
RDW: 11.9 % (ref 11.0–15.0)
Total Lymphocyte: 30.2 %
WBC: 6.5 10*3/uL (ref 3.8–10.8)

## 2023-11-05 LAB — MICROALBUMIN / CREATININE URINE RATIO
Creatinine, Urine: 76 mg/dL (ref 20–275)
Microalb Creat Ratio: 13 mg/g{creat} (ref <30)
Microalb, Ur: 1 mg/dL

## 2023-11-09 ENCOUNTER — Ambulatory Visit: Payer: Self-pay | Admitting: Family Medicine

## 2023-11-13 ENCOUNTER — Other Ambulatory Visit: Payer: Self-pay | Admitting: Cardiovascular Disease

## 2023-11-13 DIAGNOSIS — I10 Essential (primary) hypertension: Secondary | ICD-10-CM

## 2023-11-26 ENCOUNTER — Other Ambulatory Visit: Payer: Self-pay | Admitting: Cardiovascular Disease

## 2023-11-26 DIAGNOSIS — I10 Essential (primary) hypertension: Secondary | ICD-10-CM

## 2023-12-12 ENCOUNTER — Other Ambulatory Visit: Payer: Self-pay | Admitting: Nurse Practitioner

## 2023-12-12 DIAGNOSIS — I10 Essential (primary) hypertension: Secondary | ICD-10-CM

## 2023-12-29 ENCOUNTER — Other Ambulatory Visit: Payer: Self-pay | Admitting: Family Medicine

## 2023-12-29 DIAGNOSIS — N3941 Urge incontinence: Secondary | ICD-10-CM

## 2024-01-14 ENCOUNTER — Ambulatory Visit
Admission: RE | Admit: 2024-01-14 | Discharge: 2024-01-14 | Disposition: A | Discharge status: Home / Self Care | Source: Ambulatory Visit | Attending: Family Medicine | Admitting: Family Medicine

## 2024-01-14 DIAGNOSIS — Z1231 Encounter for screening mammogram for malignant neoplasm of breast: Secondary | ICD-10-CM | POA: Insufficient documentation

## 2024-02-03 ENCOUNTER — Encounter: Payer: Self-pay | Admitting: Family Medicine

## 2024-02-03 ENCOUNTER — Ambulatory Visit (INDEPENDENT_AMBULATORY_CARE_PROVIDER_SITE_OTHER): Admitting: Family Medicine

## 2024-02-03 ENCOUNTER — Other Ambulatory Visit: Payer: Self-pay | Admitting: Family Medicine

## 2024-02-03 VITALS — BP 128/72 | HR 90 | Resp 16 | Ht 65.0 in | Wt 159.9 lb

## 2024-02-03 DIAGNOSIS — H9311 Tinnitus, right ear: Secondary | ICD-10-CM

## 2024-02-03 DIAGNOSIS — H9201 Otalgia, right ear: Secondary | ICD-10-CM

## 2024-02-03 MED ORDER — METHYLPREDNISOLONE 4 MG PO TBPK: ORAL_TABLET | ORAL | 0 refills | Status: DC

## 2024-02-03 NOTE — Progress Notes (Signed)
 Name: Alyssa Lawson   MRN: [id number]    DOB: [birth year]   Date:02/03/2024       Progress Note  Subjective  Chief Complaint  Chief Complaint  Patient presents with   Ear Pain    R ear on going for a week    Discussed the use of AI scribe software for clinical note transcription with the patient, who gave verbal consent to proceed.  History of Present Illness Alyssa Lawson is a 78 year old female who presents with right-sided ear pressure and buzzing sounds.  She began experiencing a buzzing sound in her right ear last week, described as 'like bugs'. This has since evolved into a sensation of pressure on the right side of her head, including the ear. No cold, sore throat, or nasal symptoms. No headaches, dizziness, or weakness. She notes some difficulty in hearing, as she often has to ask people to repeat themselves.  She has been taking Benadryl, which helps her sleep, although the buzzing sound does not disturb her sleep. The sound bothers her when she is lying in bed using her iPad.  She has no history of diabetes, but she has allergies to Lidocaine and meloxicam.    Patient Active Problem List   Diagnosis Date Noted   Seasonal allergic rhinitis 11/04/2023   SVT (supraventricular tachycardia) (HCC) 11/04/2023   Primary osteoarthritis of both hands 12/04/2021   Primary osteoarthritis of first carpometacarpal joint of right hand 12/04/2021   Urge incontinence 12/04/2021   Anxiety 12/04/2021   Dyslipidemia associated with type 2 diabetes mellitus (HCC) 12/04/2021   Senile purpura (HCC) 04/05/2021   DDD (degenerative disc disease), cervical 04/05/2021   Chronic pain of right thumb 04/05/2021   Type 2 diabetes mellitus with complication, with long-term current use of insulin (HCC) 11/09/2018   Atrial tachycardia (HCC) 08/25/2017   Unstable angina (HCC) 05/01/2017   Abnormal EKG 04/24/2017   Smoking history 04/24/2017   Rotator cuff tendinitis, left 04/10/2017    Cataracts, bilateral 11/25/2016   Impingement syndrome of shoulder, left 10/13/2016   Chronic bilateral low back pain without sciatica 08/20/2016   Mitral regurgitation 06/22/2015   Melasma 12/21/2014   History of hysterectomy 12/21/2014   RLS (restless legs syndrome) 12/21/2014   Benign essential HTN 12/17/2014   Gastric reflux 12/17/2014   History of cervical cancer 12/17/2014   H/O iron deficiency anemia 12/17/2014   TI (tricuspid incompetence) 12/17/2014   Osteopenia of the elderly 12/17/2014    Social History   Tobacco Use   Smoking status: Former    Current packs/day: 0.00    Average packs/day: 0.5 packs/day for 39.4 years (19.7 ttl pk-yrs)    Types: Cigarettes    Start date: 06/23/1956    Quit date: 11/22/1995    Years since quitting: 28.2   Smokeless tobacco: Former    Quit date: 11/22/1996   Tobacco comments:    None  Substance Use Topics   Alcohol use: Yes    Alcohol/week: 1.0 standard drink of alcohol    Types: 1 Glasses of wine per week    Comment: glass of wine occasionally maybe once a month     Current Outpatient Medications:    acetaminophen (TYLENOL) 500 MG tablet, Take 1 tablet (500 mg total) by mouth every 6 (six) hours as needed for headache., Disp: 30 tablet, Rfl: 0   aspirin 81 MG chewable tablet, Chew 1 tablet by mouth at bedtime., Disp: , Rfl:    atorvastatin (LIPITOR) 40 MG  tablet, TAKE 1 TABLET BY MOUTH DAILY, Disp: 100 tablet, Rfl: 2   busPIRone  (BUSPAR ) 5 MG tablet, TAKE 1 TABLET(5 MG) BY MOUTH TWICE DAILY AS NEEDED, Disp: 180 tablet, Rfl: 1   carvedilol  (COREG ) 25 MG tablet, TAKE 1 TABLET BY MOUTH TWICE  DAILY WITH MEALS, Disp: 200 tablet, Rfl: 2   celecoxib  (CELEBREX ) 100 MG capsule, TAKE 1 CAPSULE(100 MG) BY MOUTH TWICE DAILY AS NEEDED, Disp: 180 capsule, Rfl: 0   Cholecalciferol (VITAMIN D) 50 MCG (2000 UT) CAPS, Take 1 capsule by mouth daily., Disp: , Rfl:    cloNIDine  (CATAPRES ) 0.1 MG tablet, TAKE 1 TABLET BY MOUTH TWICE  DAILY, Disp: 180  tablet, Rfl: 2   Cyanocobalamin (VITAMIN B-12) 2000 MCG TBCR, Take 1 tablet by mouth daily., Disp: , Rfl:    ferrous sulfate 325 (65 FE) MG tablet, Take 325 mg by mouth daily with breakfast., Disp: , Rfl:    hydrALAZINE  (APRESOLINE ) 25 MG tablet, TAKE 1 TABLET BY MOUTH 3 TIMES  DAILY, Disp: 270 tablet, Rfl: 2   isosorbide  mononitrate (IMDUR ) 30 MG 24 hr tablet, TAKE 1 TABLET BY MOUTH DAILY  AFTER LUNCH AT 3 PM, Disp: 90 tablet, Rfl: 3   MAGNESIUM-ZINC PO, Take 1 tablet by mouth daily., Disp: , Rfl:    Multiple Vitamin (MULTIVITAMIN) capsule, Take 1 capsule by mouth daily., Disp: , Rfl:    olmesartan  (BENICAR ) 40 MG tablet, TAKE 1 TABLET BY MOUTH DAILY, Disp: 100 tablet, Rfl: 2   omeprazole  (PRILOSEC) 20 MG capsule, Take 1 capsule (20 mg total) by mouth daily., Disp: 90 capsule, Rfl: 1   oxybutynin  (DITROPAN -XL) 10 MG 24 hr tablet, TAKE 1 TABLET BY MOUTH AT  BEDTIME, Disp: 90 tablet, Rfl: 1   pramipexole  (MIRAPEX ) 0.5 MG tablet, Take 1 tablet (0.5 mg total) by mouth every evening., Disp: 100 tablet, Rfl: 1   vitamin C (ASCORBIC ACID) 500 MG tablet, Take 500 mg by mouth daily., Disp: , Rfl:   Allergies  Allergen Reactions   Amlodipine  Swelling   Meloxicam  Palpitations    ROS  Ten systems reviewed and is negative except as mentioned in HPI    Objective  Vitals:   02/03/24 1503  BP: 128/72  Pulse: 90  Resp: 16  SpO2: 97%  Weight: 159 lb 14.4 oz (72.5 kg)  Height: 5' 5 (1.651 m)    Body mass index is 26.61 kg/m.  Hearing Screening   500Hz  1000Hz  2000Hz  4000Hz   Right ear Pass Pass Pass Pass  Left ear Pass Pass Pass Pass     Physical Exam CONSTITUTIONAL: Patient appears well-developed and well-nourished. No distress. HEENT: Head atraumatic, normocephalic, neck supple. Ears normal, no cerumen impaction, no infection. No tenderness on palpation of ear and surrounding areas. CARDIOVASCULAR: Normal rate, regular rhythm and normal heart sounds. No murmur heard. No BLE  edema. PULMONARY: Effort normal and breath sounds normal. No respiratory distress. ABDOMINAL: There is no tenderness or distention. MUSCULOSKELETAL: Normal gait. Without gross motor or sensory deficit. PSYCHIATRIC: Patient has a normal mood and affect. Behavior is normal. Judgment and thought content normal.  Assessment & Plan Right ear tinnitus and acute right ear pain  Acute right ear tinnitus and pressure with hearing difficulties. No infection or wax buildup. Differential includes sudden sensorineural hearing loss. - Prescribed high-dose prednisone  with tapering schedule. Discussed potential side effects, including elevated blood sugar levels. - Referred to ENT specialist for further evaluation. - Conduct hearing test on right ear before departure (normal )  - Advised  ENT visit within a week if symptoms persist.

## 2024-02-11 ENCOUNTER — Encounter: Payer: Self-pay | Admitting: Nurse Practitioner

## 2024-02-11 ENCOUNTER — Ambulatory Visit: Attending: Nurse Practitioner | Admitting: Physician Assistant

## 2024-02-11 VITALS — BP 148/90 | HR 56 | Ht 65.0 in | Wt 160.2 lb

## 2024-02-11 DIAGNOSIS — I10 Essential (primary) hypertension: Secondary | ICD-10-CM

## 2024-02-11 DIAGNOSIS — E785 Hyperlipidemia, unspecified: Secondary | ICD-10-CM

## 2024-02-11 DIAGNOSIS — E1169 Type 2 diabetes mellitus with other specified complication: Secondary | ICD-10-CM

## 2024-02-11 DIAGNOSIS — I4719 Other supraventricular tachycardia: Secondary | ICD-10-CM

## 2024-02-11 DIAGNOSIS — I340 Nonrheumatic mitral (valve) insufficiency: Secondary | ICD-10-CM | POA: Diagnosis not present

## 2024-02-11 DIAGNOSIS — I471 Supraventricular tachycardia, unspecified: Secondary | ICD-10-CM | POA: Diagnosis not present

## 2024-02-11 NOTE — Patient Instructions (Signed)
 Medication Instructions:  No changes *If you need a refill on your cardiac medications before your next appointment, please call your pharmacy*  Lab Work: None ordered If you have labs (blood work) drawn today and your tests are completely normal, you will receive your results only by: MyChart Message (if you have MyChart) OR A paper copy in the mail If you have any lab test that is abnormal or we need to change your treatment, we will call you to review the results.  Testing/Procedures: Your physician has requested that you have an echocardiogram in January 2026. Echocardiography is a painless test that uses sound waves to create images of your heart. It provides your doctor with information about the size and shape of your heart and how well your heart's chambers and valves are working.   You may receive an ultrasound enhancing agent through an IV if needed to better visualize your heart during the echo. This procedure takes approximately one hour.  There are no restrictions for this procedure.  This will take place at [address] [facility] [address], [place] [address]  Please note: We ask at that you not bring children with you during ultrasound (echo/ vascular) testing. Due to room size and safety concerns, children are not allowed in the ultrasound rooms during exams. Our front office staff cannot provide observation of children in our lobby area while testing is being conducted. An adult accompanying a patient to their appointment will only be allowed in the ultrasound room at the discretion of the ultrasound technician under special circumstances. We apologize for any inconvenience.   Follow-Up: At [facility], you and your health needs are our priority.  As part of our continuing mission to provide you with exceptional heart care, our providers are all part of one team.  This team includes your primary Cardiologist (physician) and Advanced Practice Providers  or APPs (Physician Assistants and Nurse Practitioners) who all work together to provide you with the care you need, when you need it.  Your next appointment:   6 month(s)  Provider:   You may see Timothy Gollan, MD or one of the following Advanced Practice Providers on your designated Care Team:   Lesley Maffucci, PA-C  We recommend signing up for the patient portal called MyChart.  Sign up information is provided on this After Visit Summary.  MyChart is used to connect with patients for Virtual Visits (Telemedicine).  Patients are able to view lab/test results, encounter notes, upcoming appointments, etc.  Non-urgent messages can be sent to your provider as well.   To learn more about what you can do with MyChart, go to [email or web address].

## 2024-02-11 NOTE — Progress Notes (Signed)
 Cardiology Office Note    Date:  02/11/2024   ID:  Alyssa, Lawson 25-May-1946, MRN [id number]  PCP:  Glenard Mire, MD  Cardiologist:  Evalene Lunger, MD  Electrophysiologist:  None   Chief Complaint: Follow-up  History of Present Illness:   Alyssa Lawson is a 78 y.o. female with history of chest pain with normal coronary arteries, hypertension, hyperlipidemia, diabetes, atrial tachycardia, mild to moderate mitral regurgitation, diastolic dysfunction, and anxiety who presents for follow-up on hypertension, MR, and atrial tachycardia.    Patient previously underwent evaluation for chest pain in 04/2017.  Echo showed normal LV function.  She had an abnormal stress test which was followed by diagnostic catheterization which showed normal coronary arteries, normal LV function, and moderate MR.  Repeat echo 10/2022 in the setting of hypertension and frequent PACs to continue to show preserved LV systolic function with EF of 55 to 60% without RWMA, G1 DD, severely dilated left atrium, small pericardial effusion, mild to moderate MR, and mild AI.  Patient was seen in clinic 01/2023 reporting occasional fluttering and palpitations.  ZIO monitor showed predominantly sinus rhythm with a brief runs of NSVT and 129 brief runs of PSVT.  She was continued on carvedilol .   Patient was most recently seen in our office 08/11/2023.  Blood pressure was noted to be running high with systolic home readings in the 869d to 150s.  However, office readings are consistently in the 180s systolic.  Additional 0.1 mg of clonidine  was added to her regimen.  She was continued on carvedilol  25 mg twice daily, hydralazine  25 mg 3 times daily, isosorbide  30 mg in the evening, and Benicar  40 mg daily.  She had a nurse visit to compare her home cuff to manual readings, which were similar.  Patient presents today overall doing well from a cardiac perspective.  She reports home blood  pressure readings have consistently been  in the 130s over 70s.  She is unsure why there is such a discrepancy between her readings at home in our office.  She took her medications as prescribed this morning.  Of note, she recently saw her PCP with BP readings there consistent with her home readings.  She endorses intermittent fleeting twinges in her chest which are related to certain foods.  She was recently started on PPI with significant improvement in symptoms.  She is without symptoms of angina or cardiac decompensation.  She denies chest pain, shortness of breath, palpitations, lightheadedness, dizziness, and lower extremity swelling.  Labs independently reviewed: 11/04/2023 BUN 20, creatinine 0.79, sodium 140, potassium 4.4, normal LFTs, hemoglobin 14.2, hematocrit 43.4, platelets 197, TC 151, HDL 67, TG 81, LDL 68  Objective   Past Medical History:  Diagnosis Date   Allergy ?   sinus bleeding and nose bleeding when i get out of bed in the AM   Anemia    Anxiety 12/04/2021   Arthritis    Atrial tachycardia (HCC)    Caregiver burden    taking care of husband with dementia   Cervical cancer (HCC)    Chest pain    a. 2018 False+ stress test; b. 04/2017 Cath: Nl cors, EF 55-65%, mod MR.   Chronic heart failure with preserved ejection fraction (HFpEF) (HCC)    a. 10/2022 Echo: EF 55-60%, no rwma, sev asymm LVH of basal-septal segment, GrI DD. Nl RV fxn. Mild-mod MR, Mild AI.   Chronic lower back pain    DDD (degenerative disc disease), cervical  Diastolic dysfunction    Diet-controlled type 2 diabetes mellitus (HCC)    Endometriosis    GERD (gastroesophageal reflux disease)    Hyperlipidemia    Hypertension    Mitral regurgitation    a. TTE 05/01/2017: EF 55-60%, no RWMA, mild-mod MR, mildly dilated LA, RVSF norm, PASP 35 mmHg, sm-mod pericardial effusion; b. 10/2022 Echo: EF 55-60%, no rwma, GrI DD, nl RV fxn, sev dil LA, mildly dil RA, sm pericardial eff, mild-mod MR, mild AI.   PSVT (paroxysmal supraventricular  tachycardia) (HCC)    a. 02/2023 Zio: Predominantly sinus rhythm, average rate 68.  8 brief runs of nonsustained VT.  129 brief runs of SVT.   Restless leg syndrome    a.) on pramipexole    Unstable angina (HCC)    Urinary incontinence     Current Medications: Current Meds  Medication Sig   acetaminophen  (TYLENOL ) 500 MG tablet Take 1 tablet (500 mg total) by mouth every 6 (six) hours as needed for headache.   aspirin  81 MG chewable tablet Chew 1 tablet by mouth at bedtime.   atorvastatin  (LIPITOR) 40 MG tablet TAKE 1 TABLET BY MOUTH DAILY   busPIRone  (BUSPAR ) 5 MG tablet TAKE 1 TABLET(5 MG) BY MOUTH TWICE DAILY AS NEEDED   carvedilol  (COREG ) 25 MG tablet TAKE 1 TABLET BY MOUTH TWICE  DAILY WITH MEALS   celecoxib  (CELEBREX ) 100 MG capsule TAKE 1 CAPSULE(100 MG) BY MOUTH TWICE DAILY AS NEEDED   Cholecalciferol (VITAMIN D) 50 MCG (2000 UT) CAPS Take 1 capsule by mouth daily.   cloNIDine  (CATAPRES ) 0.1 MG tablet TAKE 1 TABLET BY MOUTH TWICE  DAILY   Cyanocobalamin (VITAMIN B-12) 2000 MCG TBCR Take 1 tablet by mouth daily.   ferrous sulfate 325 (65 FE) MG tablet Take 325 mg by mouth daily with breakfast.   hydrALAZINE  (APRESOLINE ) 25 MG tablet TAKE 1 TABLET BY MOUTH 3 TIMES  DAILY   isosorbide  mononitrate (IMDUR ) 30 MG 24 hr tablet TAKE 1 TABLET BY MOUTH DAILY  AFTER LUNCH AT 3 PM   MAGNESIUM-ZINC PO Take 1 tablet by mouth daily.   methylPREDNISolone  (MEDROL  DOSEPAK) 4 MG TBPK tablet Take as directed   Multiple Vitamin (MULTIVITAMIN) capsule Take 1 capsule by mouth daily.   olmesartan  (BENICAR ) 40 MG tablet TAKE 1 TABLET BY MOUTH DAILY   omeprazole  (PRILOSEC) 20 MG capsule Take 1 capsule (20 mg total) by mouth daily.   oxybutynin  (DITROPAN -XL) 10 MG 24 hr tablet TAKE 1 TABLET BY MOUTH AT  BEDTIME   pramipexole  (MIRAPEX ) 0.5 MG tablet Take 1 tablet (0.5 mg total) by mouth every evening.   vitamin C (ASCORBIC ACID) 500 MG tablet Take 500 mg by mouth daily.    Allergies:   Amlodipine  and  Meloxicam    Social History   Socioeconomic History   Marital status: Widowed    Spouse name: [name]   Number of children: 1   Years of education: some college   Highest education level: Some college, no degree  Occupational History   Occupation: Retired  Tobacco Use   Smoking status: Former    Current packs/day: 0.00    Average packs/day: 0.5 packs/day for 39.4 years (19.7 ttl pk-yrs)    Types: Cigarettes    Start date: 06/23/1956    Quit date: 11/22/1995    Years since quitting: 28.2   Smokeless tobacco: Former    Quit date: 11/22/1996   Tobacco comments:    None  Vaping Use   Vaping status: Never Used  Substance  and Sexual Activity   Alcohol use: Yes    Alcohol/week: 1.0 standard drink of alcohol    Types: 1 Glasses of wine per week    Comment: glass of wine occasionally maybe once a month   Drug use: No   Sexual activity: Yes    Partners: Male  Other Topics Concern   Not on file  Social History Narrative   She stopped working Spring 2018, 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 [employer] - she retired because of stress   Social Drivers of Corporate investment banker Strain: Low Risk  (11/04/2023)   Overall Financial Resource Strain (CARDIA)    Difficulty of Paying Living Expenses: Not very hard  Food Insecurity: Unknown (11/04/2023)   Hunger Vital Sign    Worried About Running Out of Food in the Last Year: Never true    Ran Out of Food in the Last Year: Patient declined  Transportation Needs: No Transportation Needs (11/04/2023)   PRAPARE - Administrator, Civil Service (Medical): No    Lack of Transportation (Non-Medical): No  Physical Activity: Sufficiently Active (11/04/2023)   Exercise Vital Sign    Days of Exercise per Week: 5 days    Minutes of Exercise per Session: 130 min  Stress: No Stress Concern Present (11/04/2023)   [employer] of Occupational Health - Occupational Stress Questionnaire    Feeling of Stress :  Only a little  Social Connections: Moderately Integrated (11/04/2023)   Social Connection and Isolation Panel    Frequency of Communication with Friends and Family: More than three times a week    Frequency of Social Gatherings with Friends and Family: Once a week    Attends Religious Services: More than 4 times per year    Active Member of [employer] or Organizations: Yes    Attends Banker Meetings: 1 to 4 times per year    Marital Status: Widowed     Family History:  The patient's family history includes Arrhythmia in her father and sister; Breast cancer (age of onset: 53) in her paternal aunt; Cardiomyopathy in her sister; Congestive Heart Failure in her brother; Diabetes in her brother; Heart failure in her father; Hypertension in her brother, father, and sister.  ROS:   12-point review of systems is negative unless otherwise noted in the HPI.  EKGs/Other Studies Reviewed:    Studies reviewed were summarized above. The additional studies were reviewed today:  02/2023 Long-term monitor Normal sinus rhythm Patient had a min HR of 35 bpm, max HR of 167 bpm, and avg HR of 68 bpm.  8 Ventricular Tachycardia runs occurred, the run with the fastest interval lasting 4 beats with a max rate of 167 bpm, the longest lasting 13 beats with an avg rate of 104 bpm.  129 Supraventricular Tachycardia runs occurred, the run with the fastest interval lasting 8 beats with a max rate of 158 bpm, the longest lasting 19 beats with an avg rate of 118 bpm.  Isolated SVEs were rare (<1.0%), SVE Couplets were rare (<1.0%), and SVE Triplets were rare (<1.0%).  Isolated VEs were rare (<1.0%, 7464), VE Couplets were rare (<1.0%, 187), and VE Triplets were rare (<1.0%, 10).  Ventricular Bigeminy and Trigeminy were present  10/2022 Echo complete 1. Left ventricular ejection fraction, by estimation, is 55 to 60%. The left ventricle has normal function. The left ventricle has no regional wall motion  abnormalities. There is severe asymmetric left ventricular hypertrophy  of the basal-septal segment. Left ventricular diastolic parameters are consistent with Grade I diastolic dysfunction (impaired relaxation).   2. Right ventricular systolic function is normal. The right ventricular size is mildly enlarged.   3. Left atrial size was severely dilated.   4. Right atrial size was mildly dilated.   5. A small pericardial effusion is present. The pericardial effusion is posterior and lateral to the left ventricle.   6. The mitral valve is normal in structure. Mild to moderate mitral valve regurgitation.   7. The aortic valve is tricuspid. Aortic valve regurgitation is mild. Aortic valve mean gradient measures 10.5 mmHg.   8. The inferior vena cava is normal in size with greater than 50% respiratory variability, suggesting right atrial pressure of 3 mmHg.   04/2017 LHC No significant CAD False positive stress test Significant MR noted on LV gram Metoprolol  given 5 mg IV x 1 for atrial tachycardia after the case, converted back to NSR BP elevated throughout the case   EKG:  EKG personally reviewed by me today EKG Interpretation Date/Time:  Thursday February 11 2024 13:57:03 EDT Ventricular Rate:  56 PR Interval:  162 QRS Duration:  106 QT Interval:  454 QTC Calculation: 438 R Axis:   -51  Text Interpretation: Sinus bradycardia Left axis deviation Minimal voltage criteria for LVH, may be normal variant ( Cornell product ) Anterior infarct (cited on or before 21-Jul-2022) When compared with ECG of 08-Jul-2023 13:55, Premature supraventricular complexes are no longer Present Questionable change in initial forces of Septal leads T wave inversion less evident in Lateral leads Confirmed by Lorene Sinclair ([id number]) on 02/11/2024 1:59:13 PM  PHYSICAL EXAM:    VS:  BP (!) 148/90 (BP Location: Left Arm, Patient Position: Sitting, Cuff Size: Normal)   Pulse (!) 56   Ht 5' 5 (1.651 m)   Wt 160 lb 4 oz (72.7  kg)   SpO2 96%   BMI 26.67 kg/m   BMI: Body mass index is 26.67 kg/m.  GEN: Well nourished, well developed in no acute distress NECK: No JVD; No carotid bruits CARDIAC: RRR, 2/6 systolic murmur, no rubs gallops RESPIRATORY:  Clear to auscultation without rales, wheezing or rhonchi  ABDOMEN: Soft, non-tender, non-distended EXTREMITIES: No edema; No deformity  Wt Readings from Last 3 Encounters:  02/11/24 160 lb 4 oz (72.7 kg)  02/03/24 159 lb 14.4 oz (72.5 kg)  11/04/23 160 lb 4.8 oz (72.7 kg)        ASSESSMENT & PLAN:   Hypertension - Patient reports compliance with medications.  Blood pressure today is 148/90 and 150/80 on recheck.  She reports home readings in the 130s/70s.  Had a nurse visit in late February where a home BP cuff was compared to manual pressures and found to be consistent.  Of note, she recently saw her PCP with a BP of 128/72.  Will opt to keep her same regimen of clonidine  0.1 mg twice daily, carvedilol  25 mg twice daily, hydralazine  25 mg 3 times daily, isosorbide  mononitrate 30 mg in the afternoon, and Benicar  40 mg daily.  Atrial tachycardia PSVT - Event monitor 02/2023 showed predominantly sinus rhythm with brief runs of NSVT and SVT.  She is asymptomatic to this.  She is continued on beta-blocker therapy.  Hyperlipidemia - Most recent lipid panel 10/2023 with LDL 68.  She is continued on statin therapy.  Mild to mod mitral regurgitation - Most recent echo 10/2022.  Plan to repeat echo prior to follow-up.    Disposition:  F/u with Dr. Gollan or an APP in 6 months, recheck echo prior.   Medication Adjustments/Labs and Tests Ordered: Current medicines are reviewed at length with the patient today.  Concerns regarding medicines are outlined above. Medication changes, Labs and Tests ordered today are summarized above and listed in the Patient Instructions accessible in Encounters.   Bonney Lesley Maffucci, PA-C 02/11/2024 3:56 PM     [facility]  - [place] [address] [place], [place] [address] [phone]

## 2024-04-25 ENCOUNTER — Encounter: Payer: Self-pay | Admitting: Family Medicine

## 2024-05-01 ENCOUNTER — Other Ambulatory Visit: Payer: Self-pay | Admitting: Family Medicine

## 2024-05-02 ENCOUNTER — Encounter: Payer: Self-pay | Admitting: Cardiovascular Disease

## 2024-05-03 ENCOUNTER — Other Ambulatory Visit: Payer: Self-pay | Admitting: Family Medicine

## 2024-05-03 DIAGNOSIS — F419 Anxiety disorder, unspecified: Secondary | ICD-10-CM

## 2024-05-05 NOTE — Telephone Encounter (Signed)
 Requested Prescriptions  Pending Prescriptions Disp Refills   busPIRone  (BUSPAR ) 5 MG tablet [Pharmacy Med Name: BUSPIRONE  5MG  TABLETS] 180 tablet 1    Sig: TAKE 1 TABLET(5 MG) BY MOUTH TWICE DAILY AS NEEDED     Psychiatry: Anxiolytics/Hypnotics - Non-controlled Passed - 05/05/2024 10:31 AM      Passed - Valid encounter within last 12 months    Recent Outpatient Visits           3 months ago Right-sided tinnitus   [facility], Dorette, MD   6 months ago Dyslipidemia associated with type 2 diabetes mellitus [facility])   [facility] Glenard Dorette, MD       Future Appointments             In 6 days Sowles, Krichna, MD [facility], [place]

## 2024-05-06 ENCOUNTER — Other Ambulatory Visit: Payer: Self-pay | Admitting: Family Medicine

## 2024-05-08 NOTE — Telephone Encounter (Signed)
 Requested Prescriptions  Pending Prescriptions Disp Refills   omeprazole  (PRILOSEC) 20 MG capsule [Pharmacy Med Name: OMEPRAZOLE  20MG  CAPSULES] 90 capsule 1    Sig: TAKE 1 CAPSULE(20 MG) BY MOUTH DAILY     Gastroenterology: Proton Pump Inhibitors Passed - 05/08/2024 12:13 PM      Passed - Valid encounter within last 12 months    Recent Outpatient Visits           3 months ago Right-sided tinnitus   [facility] Glenard Mire, MD   6 months ago Dyslipidemia associated with type 2 diabetes mellitus [facility])   [facility] Glenard Mire, MD       Future Appointments             In 3 days Sowles, Krichna, MD [facility], [place]

## 2024-05-11 ENCOUNTER — Encounter: Payer: Self-pay | Admitting: Family Medicine

## 2024-05-11 ENCOUNTER — Ambulatory Visit: Admitting: Family Medicine

## 2024-05-11 VITALS — BP 146/84 | HR 69 | Resp 16 | Ht 65.0 in | Wt 162.3 lb

## 2024-05-11 DIAGNOSIS — M19042 Primary osteoarthritis, left hand: Secondary | ICD-10-CM

## 2024-05-11 DIAGNOSIS — F419 Anxiety disorder, unspecified: Secondary | ICD-10-CM

## 2024-05-11 DIAGNOSIS — E1169 Type 2 diabetes mellitus with other specified complication: Secondary | ICD-10-CM | POA: Diagnosis not present

## 2024-05-11 DIAGNOSIS — M19041 Primary osteoarthritis, right hand: Secondary | ICD-10-CM

## 2024-05-11 DIAGNOSIS — I471 Supraventricular tachycardia, unspecified: Secondary | ICD-10-CM | POA: Diagnosis not present

## 2024-05-11 DIAGNOSIS — E1159 Type 2 diabetes mellitus with other circulatory complications: Secondary | ICD-10-CM | POA: Diagnosis not present

## 2024-05-11 DIAGNOSIS — I200 Unstable angina: Secondary | ICD-10-CM | POA: Diagnosis not present

## 2024-05-11 DIAGNOSIS — N3941 Urge incontinence: Secondary | ICD-10-CM

## 2024-05-11 DIAGNOSIS — E785 Hyperlipidemia, unspecified: Secondary | ICD-10-CM | POA: Diagnosis not present

## 2024-05-11 DIAGNOSIS — G2581 Restless legs syndrome: Secondary | ICD-10-CM

## 2024-05-11 DIAGNOSIS — K219 Gastro-esophageal reflux disease without esophagitis: Secondary | ICD-10-CM

## 2024-05-11 DIAGNOSIS — I152 Hypertension secondary to endocrine disorders: Secondary | ICD-10-CM

## 2024-05-11 LAB — POCT GLYCOSYLATED HEMOGLOBIN (HGB A1C): Hemoglobin A1C: 5.5 % (ref 4.0–5.6)

## 2024-05-11 MED ORDER — CELECOXIB 100 MG PO CAPS: 100.0000 mg | ORAL_CAPSULE | Freq: Two times a day (BID) | ORAL | 1 refills | Status: AC

## 2024-05-11 MED ORDER — PRAMIPEXOLE DIHYDROCHLORIDE 0.5 MG PO TABS: 0.5000 mg | ORAL_TABLET | Freq: Every evening | ORAL | 1 refills | Status: AC

## 2024-05-11 MED ORDER — OMEPRAZOLE 20 MG PO CPDR: 20.0000 mg | DELAYED_RELEASE_CAPSULE | Freq: Every day | ORAL | 1 refills | Status: AC

## 2024-05-11 MED ORDER — OXYBUTYNIN CHLORIDE ER 10 MG PO TB24: 10.0000 mg | ORAL_TABLET | Freq: Every day | ORAL | 1 refills | Status: AC

## 2024-05-11 MED ORDER — BUSPIRONE HCL 5 MG PO TABS: 5.0000 mg | ORAL_TABLET | Freq: Three times a day (TID) | ORAL | 1 refills | Status: AC | PRN

## 2024-05-11 NOTE — Progress Notes (Signed)
 Name: Alyssa Lawson   MRN: [id number]    DOB: June 24, 1945   Date:05/11/2024       Progress Note  Subjective  Chief Complaint  Chief Complaint  Patient presents with   Medical Management of Chronic Issues   Discussed the use of AI scribe software for clinical note transcription with the patient, who gave verbal consent to proceed.  History of Present Illness Alyssa Lawson is a 78 year old female with hypertension and coronary artery disease who presents for blood pressure management and follow-up.  She monitors her blood pressure at home, with readings of 125/80 mmHg and 137/71 mmHg, but clinic readings have been higher, recently at 154/96 mmHg. She uses a home blood pressure monitor.  She has a history of unstable angina and supraventricular tachycardia, with occasional palpitations lasting only a second. No recent episodes of chest pain  due to taking Imdur  daily  Current medications include carvedilol  25 mg twice daily, clonidine  0.1 mg twice daily, hydralazine  25 mg three times daily, olmesartan  40 mg once daily for HTN and also heart disease. She states occasionally has SOB  Diagnosed with type 2 diabetes in June of the previous year and has dyslipidemia. Recent blood work in May showed a blood sugar level of 109 mg/dL, normal kidney and liver function, and LDL below 70 mg/dL. She takes atorvastatin  40 mg daily for dyslipidemia and reports no muscle aches or cramps. She has restless legs syndrome and takes Mirapex  0.5 mg.  She experiences osteoarthritis, primarily affecting her hands, and takes Celebrex  daily. She completed a course of prednisone  in August.  She has a history of gastroesophageal reflux disease and takes omeprazole . She experiences anxiety and takes Buspar  twice daily, with an option to increase the dose before medical appointments.  She reports symptoms of overactive bladder, including increased frequency of urination, especially at night. She takes Ditropan  XL 10  mg for this condition but notes persistent symptoms.    Patient Active Problem List   Diagnosis Date Noted   Seasonal allergic rhinitis 11/04/2023   SVT (supraventricular tachycardia) 11/04/2023   Primary osteoarthritis of both hands 12/04/2021   Primary osteoarthritis of first carpometacarpal joint of right hand 12/04/2021   Urge incontinence 12/04/2021   Anxiety 12/04/2021   Dyslipidemia associated with type 2 diabetes mellitus (HCC) 12/04/2021   Senile purpura 04/05/2021   DDD (degenerative disc disease), cervical 04/05/2021   Chronic pain of right thumb 04/05/2021   Type 2 diabetes mellitus with complication, with long-term current use of insulin  (HCC) 11/09/2018   Atrial tachycardia 08/25/2017   Abnormal EKG 04/24/2017   Smoking history 04/24/2017   Rotator cuff tendinitis, left 04/10/2017   Cataracts, bilateral 11/25/2016   Impingement syndrome of shoulder, left 10/13/2016   Chronic bilateral low back pain without sciatica 08/20/2016   Mitral regurgitation 06/22/2015   Melasma 12/21/2014   History of hysterectomy 12/21/2014   RLS (restless legs syndrome) 12/21/2014   Benign essential HTN 12/17/2014   Gastric reflux 12/17/2014   History of cervical cancer 12/17/2014   H/O iron deficiency anemia 12/17/2014   TI (tricuspid incompetence) 12/17/2014   Osteopenia of the elderly 12/17/2014    Past Surgical History:  Procedure Laterality Date   ABDOMINAL HYSTERECTOMY  1976   CARDIAC CATHETERIZATION     CARPOMETACARPAL (CMC) FUSION OF THUMB Right 07/23/2022   Procedure: RIGHT THUMB CMC SUSPENSION ARTHROPLASTY;  Surgeon: Edie Norleen PARAS, MD;  Location: [facility] ORS;  Service: Orthopedics;  Laterality: Right;   LEFT HEART  CATH AND CORONARY ANGIOGRAPHY N/A 05/01/2017   Procedure: LEFT HEART CATH AND CORONARY ANGIOGRAPHY;  Surgeon: Perla Evalene PARAS, MD;  Location: [facility] INVASIVE CV [facility];  Service: Cardiovascular;  Laterality: N/A;   OOPHORECTOMY      Family History  Problem Relation  Age of Onset   Heart failure Father    Arrhythmia Father    Hypertension Father    Arrhythmia Sister    Hypertension Sister    Congestive Heart Failure Brother    Hypertension Brother    Cardiomyopathy Sister    Diabetes Brother    Breast cancer Paternal Aunt 75    Social History   Tobacco Use   Smoking status: Former    Current packs/day: 0.00    Average packs/day: 0.5 packs/day for 39.4 years (19.7 ttl pk-yrs)    Types: Cigarettes    Start date: 06/23/1956    Quit date: 11/22/1995    Years since quitting: 28.4   Smokeless tobacco: Former    Quit date: 11/22/1996   Tobacco comments:    None  Substance Use Topics   Alcohol use: Yes    Alcohol/week: 1.0 standard drink of alcohol    Types: 1 Glasses of wine per week    Comment: glass of wine occasionally maybe once a month     Current Outpatient Medications:    acetaminophen  (TYLENOL ) 500 MG tablet, Take 1 tablet (500 mg total) by mouth every 6 (six) hours as needed for headache., Disp: 30 tablet, Rfl: 0   aspirin  81 MG chewable tablet, Chew 1 tablet by mouth at bedtime., Disp: , Rfl:    atorvastatin  (LIPITOR) 40 MG tablet, TAKE 1 TABLET BY MOUTH DAILY, Disp: 100 tablet, Rfl: 2   carvedilol  (COREG ) 25 MG tablet, TAKE 1 TABLET BY MOUTH TWICE  DAILY WITH MEALS, Disp: 200 tablet, Rfl: 2   Cholecalciferol (VITAMIN D) 50 MCG (2000 UT) CAPS, Take 1 capsule by mouth daily., Disp: , Rfl:    cloNIDine  (CATAPRES ) 0.1 MG tablet, TAKE 1 TABLET BY MOUTH TWICE  DAILY, Disp: 180 tablet, Rfl: 2   Cyanocobalamin (VITAMIN B-12) 2000 MCG TBCR, Take 1 tablet by mouth daily., Disp: , Rfl:    ferrous sulfate 325 (65 FE) MG tablet, Take 325 mg by mouth daily with breakfast., Disp: , Rfl:    hydrALAZINE  (APRESOLINE ) 25 MG tablet, TAKE 1 TABLET BY MOUTH 3 TIMES  DAILY, Disp: 270 tablet, Rfl: 2   isosorbide  mononitrate (IMDUR ) 30 MG 24 hr tablet, TAKE 1 TABLET BY MOUTH DAILY  AFTER LUNCH AT 3 PM, Disp: 90 tablet, Rfl: 3   MAGNESIUM-ZINC PO, Take 1 tablet  by mouth daily., Disp: , Rfl:    Multiple Vitamin (MULTIVITAMIN) capsule, Take 1 capsule by mouth daily., Disp: , Rfl:    olmesartan  (BENICAR ) 40 MG tablet, TAKE 1 TABLET BY MOUTH DAILY, Disp: 100 tablet, Rfl: 2   vitamin C (ASCORBIC ACID) 500 MG tablet, Take 500 mg by mouth daily., Disp: , Rfl:    busPIRone  (BUSPAR ) 5 MG tablet, Take 1 tablet (5 mg total) by mouth 3 (three) times daily as needed. TAKE 1 TABLET(5 MG) BY MOUTH TWICE DAILY AS NEEDED, Disp: 270 tablet, Rfl: 1   celecoxib  (CELEBREX ) 100 MG capsule, Take 1 capsule (100 mg total) by mouth 2 (two) times daily., Disp: 180 capsule, Rfl: 1   omeprazole  (PRILOSEC) 20 MG capsule, Take 1 capsule (20 mg total) by mouth daily., Disp: 90 capsule, Rfl: 1   oxybutynin  (DITROPAN -XL) 10 MG 24 hr tablet,  Take 1 tablet (10 mg total) by mouth at bedtime., Disp: 90 tablet, Rfl: 1   pramipexole  (MIRAPEX ) 0.5 MG tablet, Take 1 tablet (0.5 mg total) by mouth every evening., Disp: 100 tablet, Rfl: 1  Allergies  Allergen Reactions   Amlodipine  Swelling   Meloxicam  Palpitations    I personally reviewed active problem list, medication list, allergies, family history with the patient/caregiver today.   ROS  Ten systems reviewed and is negative except as mentioned in HPI    Objective Physical Exam  CONSTITUTIONAL: Patient appears well-developed and well-nourished.  No distress. HEENT: Head atraumatic, normocephalic, neck supple. CARDIOVASCULAR: Normal rate, regular rhythm and normal heart sounds.  No murmur heard. No BLE edema. PULMONARY: Effort normal and breath sounds normal. No respiratory distress. ABDOMINAL: There is no tenderness or distention. MUSCULOSKELETAL: Normal gait. Without gross motor or sensory deficit. PSYCHIATRIC: Patient has a normal mood and affect. behavior is normal. Judgment and thought content normal.  Vitals:   05/11/24 1333 05/11/24 1400  BP: (!) 154/96 (!) 146/84  Pulse: 69   Resp: 16   SpO2: 95%   Weight: 162 lb  4.8 oz (73.6 kg)   Height: 5' 5 (1.651 m)     Body mass index is 27.01 kg/m.  Recent Results (from the past 2160 hours)  POCT glycosylated hemoglobin (Hb A1C)     Status: None   Collection Time: 05/11/24  1:43 PM  Result Value Ref Range   Hemoglobin A1C 5.5 4.0 - 5.6 %   HbA1c POC (<> result, manual entry)     HbA1c, POC (prediabetic range)     HbA1c, POC (controlled diabetic range)        PHQ2/9:    05/11/2024    1:27 PM 02/03/2024    2:59 PM 11/04/2023    1:55 PM 06/11/2023    1:49 PM 04/01/2023    2:59 PM  Depression screen PHQ 2/9  Decreased Interest 0 0 0 0 0  Down, Depressed, Hopeless 0 0 0 0 0  PHQ - 2 Score 0 0 0 0 0  Altered sleeping    0 0  Tired, decreased energy    0 1  Change in appetite    0 0  Feeling bad or failure about yourself     0 0  Trouble concentrating    0 0  Moving slowly or fidgety/restless    0 0  Suicidal thoughts    0 0  PHQ-9 Score    0  1   Difficult doing work/chores    Not difficult at all      Data saved with a previous flowsheet row definition    phq 9 is negative  Fall Risk:    05/11/2024    1:27 PM 02/03/2024    2:59 PM 11/04/2023    1:55 PM 06/11/2023    1:51 PM 04/28/2023   10:23 AM  Fall Risk   Falls in the past year? 0 0 0 1 1  Number falls in past yr: 0 0 0 1 1  Injury with Fall? 0 0 0 0 0  Risk for fall due to : No Fall Risks No Fall Risks No Fall Risks Impaired mobility   Follow up Falls evaluation completed Falls evaluation completed Falls prevention discussed;Education provided;Falls evaluation completed Falls evaluation completed;Falls prevention discussed       Assessment & Plan Type 2 diabetes mellitus with circulatory complications and difficult to control hypertension Blood pressure elevated in office, suggesting white coat hypertension.  Current medications include carvedilol , clonidine , hydralazine , and olmesartan . Discussed dietary sodium reduction. - Checked blood pressure before leaving office. -  Scheduled nurse visit for blood pressure calibration with home monitor. - Continue current antihypertensive regimen. - Avoid salt and red snacks.  Unstable angina and supraventricular tachycardia No recent chest pain. Supraventricular tachycardia brief, no dyspnea. Isosorbide  and aspirin  effective. - Continue isosorbide  and aspirin .  Dyslipidemia associated with type 2 diabetes Cholesterol controlled with atorvastatin . No muscle pain. Discussed atorvastatin  recall. - Continue atorvastatin . - Check with pharmacy regarding atorvastatin  recall.  Primary osteoarthritis, right hand Symptoms managed with Celebrex . Discussed impact on blood pressure and kidney function. Advised alternating with Tylenol . - Continue Celebrex , consider alternating with Tylenol  to reduce dosage. - Sent Celebrex  prescription to [facility].  Restless legs syndrome Symptoms controlled with Mirapex  0.5 mg. - Continue Mirapex  0.5 mg.  Urge incontinence Increased urinary frequency. No dysuria. Discussed urologist referral. - Referred to urologist for further evaluation. - Continue ditropan  XL 10 mg.  Anxiety disorder Managed with Buspar . Advised additional dose before appointments to manage anxiety and lower blood pressure. - Continue Buspar  twice daily. - Take additional Buspar  dose before medical appointments. - Sent Buspar  prescription to [facility].  Gastroesophageal reflux disease Symptoms managed with omeprazole . - Continue omeprazole . - Sent omeprazole  prescription to [facility].  General Health Maintenance Received flu shot and second Shingrix  vaccine. COVID vaccine records current. - Continue routine health maintenance and vaccinations.

## 2024-05-25 ENCOUNTER — Ambulatory Visit

## 2024-05-27 ENCOUNTER — Other Ambulatory Visit: Payer: Self-pay | Admitting: Family Medicine

## 2024-05-27 DIAGNOSIS — G2581 Restless legs syndrome: Secondary | ICD-10-CM

## 2024-05-31 ENCOUNTER — Ambulatory Visit

## 2024-05-31 NOTE — Telephone Encounter (Signed)
 Too soon for refill, LRF 05/11/24 for 90/1 RF.  Requested Prescriptions  Pending Prescriptions Disp Refills   pramipexole  (MIRAPEX ) 0.5 MG tablet [Pharmacy Med Name: PRAMIPEXOLE  0.5MG  TABLETS] 100 tablet 1    Sig: TAKE 1 TABLET(0.5 MG) BY MOUTH EVERY EVENING     Neurology:  Parkinsonian Agents Failed - 05/31/2024  9:19 AM      Failed - Last BP in normal range    BP Readings from Last 1 Encounters:  05/11/24 (!) 146/84         Passed - Last Heart Rate in normal range    Pulse Readings from Last 1 Encounters:  05/11/24 69         Passed - Valid encounter within last 12 months    Recent Outpatient Visits           2 weeks ago Dyslipidemia associated with type 2 diabetes mellitus [facility])   [place] [facility] Glenard Mire, MD   3 months ago Right-sided tinnitus   [place] [facility] [place], Mire, MD   6 months ago Dyslipidemia associated with type 2 diabetes mellitus [facility])   [place] [facility] Sowles, Krichna, MD       Future Appointments             In 2 months MacDiarmid, [place], MD [facility] [place]

## 2024-06-04 ENCOUNTER — Other Ambulatory Visit: Payer: Self-pay | Admitting: Cardiovascular Disease

## 2024-06-04 DIAGNOSIS — I10 Essential (primary) hypertension: Secondary | ICD-10-CM

## 2024-06-27 ENCOUNTER — Ambulatory Visit: Attending: Physician Assistant

## 2024-06-27 DIAGNOSIS — I340 Nonrheumatic mitral (valve) insufficiency: Secondary | ICD-10-CM | POA: Diagnosis not present

## 2024-06-27 LAB — ECHOCARDIOGRAM COMPLETE
AR max vel: 2.01 cm2
AV Area VTI: 1.89 cm2
AV Area mean vel: 2.13 cm2
AV Mean grad: 9 mmHg
AV Peak grad: 16.6 mmHg
Ao pk vel: 2.04 m/s
Area-P 1/2: 3.72 cm2
S' Lateral: 3.21 cm

## 2024-06-28 ENCOUNTER — Ambulatory Visit: Payer: Self-pay | Admitting: Physician Assistant

## 2024-06-29 NOTE — Progress Notes (Signed)
 Last read by Vana JONETTA Joshua Margaretann at 9:22AM on 06/29/2024.

## 2024-06-30 ENCOUNTER — Ambulatory Visit: Payer: Self-pay

## 2024-06-30 DIAGNOSIS — Z0000 Encounter for general adult medical examination without abnormal findings: Secondary | ICD-10-CM | POA: Diagnosis not present

## 2024-06-30 DIAGNOSIS — Z780 Asymptomatic menopausal state: Secondary | ICD-10-CM

## 2024-06-30 NOTE — Patient Instructions (Addendum)
 Alyssa Lawson,  Thank you for taking the time for your Medicare Wellness Visit. I appreciate your continued commitment to your health goals. Please review the care plan we discussed, and feel free to reach out if I can assist you further.  Please note that Annual Wellness Visits do not include a physical exam. Some assessments may be limited, especially if the visit was conducted virtually. If needed, we may recommend an in-person follow-up with your provider.  Ongoing Care Seeing your primary care provider every 3 to 6 months helps us  monitor your health and provide consistent, personalized care. APPT W/ DR.SOWLES 11/08/24 @ 1:20 PM   Referrals If a referral was made during today's visit and you haven't received any updates within two weeks, please contact the referred provider directly to check on the status.  REFERRAL SENT FOR BONE DENSITY SCAN You have an order for:  []   2D Mammogram  []   3D Mammogram  [x]   Bone Density     Please call for appointment:  [facility]  [address] [place] [address] [phone] [facility] [address] [place], [place] [address] [phone] [facility] [address] [place], [place] [address] [phone]   Make sure to wear two-piece clothing.  No lotions, powders, or deodorants the day of the appointment. Make sure to bring picture ID and insurance card.  Bring list of medications you are currently taking including any supplements.   Schedule your [place] screening mammogram through MyChart!   Log into your MyChart account.  Go to 'Visit' (or 'Appointments' if on mobile App) --> Schedule an Appointment  Under 'Select a Reason for Visit' choose the Mammogram Screening option.  Complete the pre-visit questions and select the time and place that best fits your schedule.   Recommended Screenings:  Health  Maintenance  Topic Date Due   Osteoporosis screening with Bone Density Scan  08/05/2023   Eye exam for diabetics  11/12/2023   COVID-19 Vaccine (9 - 2025-26 season) 02/22/2024   Yearly kidney function blood test for diabetes  11/03/2024   Yearly kidney health urinalysis for diabetes  11/03/2024   Complete foot exam   11/03/2024   Hemoglobin A1C  11/08/2024   Breast Cancer Screening  01/13/2025   Medicare Annual Wellness Visit  06/30/2025   DTaP/Tdap/Td vaccine (3 - Td or Tdap) 01/21/2028   Pneumococcal Vaccine for age over 38  Completed   Flu Shot  Completed   Hepatitis C Screening  Completed   Zoster (Shingles) Vaccine  Completed   Meningitis B Vaccine  Aged Out   Colon Cancer Screening  Discontinued     Vision: Annual vision screenings are recommended for early detection of glaucoma, cataracts, and diabetic retinopathy. These exams can also reveal signs of chronic conditions such as diabetes and high blood pressure.  Dental: Annual dental screenings help detect early signs of oral cancer, gum disease, and other conditions linked to overall health, including heart disease and diabetes.  Please see the attached documents for additional preventive care recommendations.   NEXT AWV  07/06/25 @ 11:30 PM BY VIDEO

## 2024-06-30 NOTE — Progress Notes (Signed)
 "  Chief Complaint  Patient presents with   Medicare Wellness     Subjective:   Alyssa Lawson is a 79 y.o. female who presents for a Medicare Annual Wellness Visit.  Visit info / Clinical Intake: Medicare Wellness Visit Type:: Subsequent Annual Wellness Visit Persons participating in visit and providing information:: patient Medicare Wellness Visit Mode:: Video Since this visit was completed virtually, some vitals may be partially provided or unavailable. Missing vitals are due to the limitations of the virtual format.: Unable to obtain vitals - no equipment If Telephone or Video please confirm:: I connected with patient using audio/video enable telemedicine. I verified patient identity with two identifiers, discussed telehealth limitations, and patient agreed to proceed. Patient Location:: home Provider Location:: office Interpreter Needed?: No Pre-visit prep was completed: yes AWV questionnaire completed by patient prior to visit?: yes Date:: 06/29/24 Living arrangements:: with family/others Patient's Overall Health Status Rating: good Typical amount of pain: some Does pain affect daily life?: no Are you currently prescribed opioids?: no  Dietary Habits and Nutritional Risks How many meals a day?: 2 Eats fruit and vegetables daily?: yes Most meals are obtained by: preparing own meals In the last 2 weeks, have you had any of the following?: (!) nausea, vomiting, diarrhea (DIARRHEA/CRAMPS) Diabetic:: no  Functional Status Activities of Daily Living (to include ambulation/medication): Independent Ambulation: Independent Medication Administration: Independent Home Management (perform basic housework or laundry): Independent Manage your own finances?: yes Primary transportation is: driving Concerns about vision?: no *vision screening is required for [facility]* ([place]- [facility]) Concerns about hearing?: no  Fall Screening Falls in the past year?: 0 Number of falls in  past year: 0 Was there an injury with Fall?: 0 Fall Risk Category Calculator: 0 Patient Fall Risk Level: Low Fall Risk  Fall Risk Patient at Risk for Falls Due to: No Fall Risks Fall risk Follow up: Falls evaluation completed; Falls prevention discussed  Home and Transportation Safety: All rugs have non-skid backing?: yes All stairs or steps have railings?: yes Grab bars in the bathtub or shower?: yes Have non-skid surface in bathtub or shower?: (!) no Good home lighting?: yes Regular seat belt use?: yes Hospital stays in the last year:: no  Cognitive Assessment Difficulty concentrating, remembering, or making decisions? : no Will 6CIT or Mini Cog be Completed: yes What year is it?: 0 points What month is it?: 0 points Give patient an address phrase to remember (5 components): [address] [place], [place] About what time is it?: 0 points Count backwards from 20 to 1: 0 points Say the months of the year in reverse: 0 points Repeat the address phrase from earlier: 0 points 6 CIT Score: 0 points  Advance Directives (For Healthcare) Does Patient Have a Medical Advance Directive?: No Would patient like information on creating a medical advance directive?: No - Patient declined  Reviewed/Updated  Reviewed/Updated: Reviewed All (Medical, Surgical, Family, Medications, Allergies, Care Teams, Patient Goals)    Allergies (verified) Amlodipine  and Meloxicam    Current Medications (verified) Outpatient Encounter Medications as of 06/30/2024  Medication Sig   acetaminophen  (TYLENOL ) 500 MG tablet Take 1 tablet (500 mg total) by mouth every 6 (six) hours as needed for headache.   aspirin  81 MG chewable tablet Chew 1 tablet by mouth at bedtime.   atorvastatin  (LIPITOR) 40 MG tablet TAKE 1 TABLET BY MOUTH DAILY   busPIRone  (BUSPAR ) 5 MG tablet Take 1 tablet (5 mg total) by mouth 3 (three) times daily as needed.  TAKE 1 TABLET(5 MG) BY MOUTH TWICE DAILY AS NEEDED   carvedilol  (COREG )  25 MG tablet TAKE 1 TABLET BY MOUTH TWICE  DAILY WITH MEALS   celecoxib  (CELEBREX ) 100 MG capsule Take 1 capsule (100 mg total) by mouth 2 (two) times daily. (Patient taking differently: Take 100 mg by mouth daily.)   Cholecalciferol (VITAMIN D) 50 MCG (2000 UT) CAPS Take 1 capsule by mouth daily.   cloNIDine  (CATAPRES ) 0.1 MG tablet TAKE 1 TABLET BY MOUTH TWICE  DAILY   Cyanocobalamin (VITAMIN B-12) 2000 MCG TBCR Take 1 tablet by mouth daily.   ferrous sulfate 325 (65 FE) MG tablet Take 325 mg by mouth daily with breakfast.   hydrALAZINE  (APRESOLINE ) 25 MG tablet TAKE 1 TABLET BY MOUTH 3 TIMES  DAILY   isosorbide  mononitrate (IMDUR ) 30 MG 24 hr tablet TAKE 1 TABLET BY MOUTH DAILY  AFTER LUNCH AT 3 PM   MAGNESIUM-ZINC PO Take 1 tablet by mouth daily.   Multiple Vitamin (MULTIVITAMIN) capsule Take 1 capsule by mouth daily.   olmesartan  (BENICAR ) 40 MG tablet TAKE 1 TABLET BY MOUTH DAILY   omeprazole  (PRILOSEC) 20 MG capsule Take 1 capsule (20 mg total) by mouth daily.   oxybutynin  (DITROPAN -XL) 10 MG 24 hr tablet Take 1 tablet (10 mg total) by mouth at bedtime.   pramipexole  (MIRAPEX ) 0.5 MG tablet Take 1 tablet (0.5 mg total) by mouth every evening.   vitamin C (ASCORBIC ACID) 500 MG tablet Take 500 mg by mouth daily.   No facility-administered encounter medications on file as of 06/30/2024.    History: Past Medical History:  Diagnosis Date   Allergy ?   sinus bleeding and nose bleeding when i get out of bed in the AM   Anemia    Anxiety 12/04/2021   Arthritis    Atrial tachycardia    Caregiver burden    taking care of husband with dementia   Cervical cancer (HCC)    Chest pain    a. 2018 False+ stress test; b. 04/2017 Cath: Nl cors, EF 55-65%, mod MR.   Chronic heart failure with preserved ejection fraction (HFpEF) (HCC)    a. 10/2022 Echo: EF 55-60%, no rwma, sev asymm LVH of basal-septal segment, GrI DD. Nl RV fxn. Mild-mod MR, Mild AI.   Chronic lower back pain    DDD  (degenerative disc disease), cervical    Diastolic dysfunction    Diet-controlled type 2 diabetes mellitus (HCC)    Endometriosis    GERD (gastroesophageal reflux disease)    Hyperlipidemia    Hypertension    Mitral regurgitation    a. TTE 05/01/2017: EF 55-60%, no RWMA, mild-mod MR, mildly dilated LA, RVSF norm, PASP 35 mmHg, sm-mod pericardial effusion; b. 10/2022 Echo: EF 55-60%, no rwma, GrI DD, nl RV fxn, sev dil LA, mildly dil RA, sm pericardial eff, mild-mod MR, mild AI.   PSVT (paroxysmal supraventricular tachycardia)    a. 02/2023 Zio: Predominantly sinus rhythm, average rate 68.  8 brief runs of nonsustained VT.  129 brief runs of SVT.   Restless leg syndrome    a.) on pramipexole    Unstable angina (HCC)    Urinary incontinence    Past Surgical History:  Procedure Laterality Date   ABDOMINAL HYSTERECTOMY  1976   CARDIAC CATHETERIZATION     CARPOMETACARPAL (CMC) FUSION OF THUMB Right 07/23/2022   Procedure: RIGHT THUMB CMC SUSPENSION ARTHROPLASTY;  Surgeon: Edie Norleen PARAS, MD;  Location: [facility] ORS;  Service: Orthopedics;  Laterality: Right;  LEFT HEART CATH AND CORONARY ANGIOGRAPHY N/A 05/01/2017   Procedure: LEFT HEART CATH AND CORONARY ANGIOGRAPHY;  Surgeon: Perla Evalene PARAS, MD;  Location: [facility] INVASIVE CV LAB;  Service: Cardiovascular;  Laterality: N/A;   OOPHORECTOMY     Family History  Problem Relation Age of Onset   Heart failure Father    Arrhythmia Father    Hypertension Father    Arrhythmia Sister    Hypertension Sister    Congestive Heart Failure Brother    Hypertension Brother    Cardiomyopathy Sister    Diabetes Brother    Breast cancer Paternal Aunt 24   Social History   Occupational History   Occupation: Retired  Tobacco Use   Smoking status: Former    Current packs/day: 0.00    Average packs/day: 0.5 packs/day for 39.4 years (19.7 ttl pk-yrs)    Types: Cigarettes    Start date: 06/23/1956    Quit date: 11/22/1995    Years since quitting: 28.6    Smokeless tobacco: Former    Quit date: 11/22/1996   Tobacco comments:    None  Vaping Use   Vaping status: Never Used  Substance and Sexual Activity   Alcohol use: Yes    Alcohol/week: 1.0 standard drink of alcohol    Types: 1 Glasses of wine per week    Comment: glass of wine occasionally maybe once a month   Drug use: No   Sexual activity: Yes    Partners: Male   Tobacco Counseling Counseling given: Not Answered Tobacco comments: None  SDOH Screenings   Food Insecurity: Food Insecurity Present (05/10/2024)  Housing: Low Risk (05/10/2024)  Transportation Needs: No Transportation Needs (05/10/2024)  Utilities: Not At Risk (06/11/2023)  Alcohol Screen: Low Risk (06/11/2023)  Depression (PHQ2-9): Low Risk (05/11/2024)  Financial Resource Strain: High Risk (05/10/2024)  Physical Activity: Sufficiently Active (05/10/2024)  Social Connections: Unknown (05/10/2024)  Stress: No Stress Concern Present (05/10/2024)  Tobacco Use: Medium Risk (06/30/2024)  Health Literacy: Adequate Health Literacy (06/11/2023)   See flowsheets for full screening details  Depression Screen PHQ 2 & 9 Depression Scale- Over the past 2 weeks, how often have you been bothered by any of the following problems? Little interest or pleasure in doing things: 0 Feeling down, depressed, or hopeless (PHQ Adolescent also includes...irritable): 0 PHQ-2 Total Score: 0     Goals Addressed             This Visit's Progress    DIET - REDUCE SUGAR INTAKE               Objective:    There were no vitals filed for this visit. There is no height or weight on file to calculate BMI.  Hearing/Vision screen Hearing Screening - Comments:: NO AIDS Vision Screening - Comments:: WEARS GLASSES- DR[facility]GR[facility] IN [place] Immunizations and Health Maintenance Health Maintenance  Topic Date Due   OPHTHALMOLOGY EXAM  11/12/2023   COVID-19 Vaccine (9 - 2025-26 season) 02/22/2024   Diabetic kidney evaluation - eGFR  measurement  11/03/2024   Diabetic kidney evaluation - Urine ACR  11/03/2024   FOOT EXAM  11/03/2024   HEMOGLOBIN A1C  11/08/2024   Mammogram  01/13/2025   Medicare Annual Wellness (AWV)  06/30/2025   DTaP/Tdap/Td (3 - Td or Tdap) 01/21/2028   Pneumococcal Vaccine: 50+ Years  Completed   Influenza Vaccine  Completed   Bone Density Scan  Completed   Hepatitis C Screening  Completed   Zoster Vaccines- Shingrix   Completed  Meningococcal B Vaccine  Aged Out   Colonoscopy  Discontinued        Assessment/Plan:  This is a routine wellness examination for [facility].  Patient Care Team: Sowles, Krichna, MD as PCP - General (Family Medicine) Perla Evalene PARAS, MD as PCP - Cardiology (Cardiology) Perla Evalene PARAS, MD as Consulting Physician (Cardiology) Verlinda Boas, PA-C (Orthopedic Surgery)  I have personally reviewed and noted the following in the patients chart:   Medical and social history Use of alcohol, tobacco or illicit drugs  Current medications and supplements including opioid prescriptions. Functional ability and status Nutritional status Physical activity Advanced directives List of other physicians Hospitalizations, surgeries, and ER visits in previous 12 months Vitals Screenings to include cognitive, depression, and falls Referrals and appointments  No orders of the defined types were placed in this encounter.  In addition, I have reviewed and discussed with patient certain preventive protocols, quality metrics, and best practice recommendations. A written personalized care plan for preventive services as well as general preventive health recommendations were provided to patient.   Jhonnie GORMAN Das, LPN   01/22/7972   Return in 1 year (on 06/30/2025).  After Visit Summary: (MyChart) Due to this being a telephonic visit, the after visit summary with patients personalized plan was offered to patient via MyChart   Nurse Notes: UTD ON SHOTS; UTD ON MAMMOGRAM; BDS  REFERRAL SENT; AGED OUT OF COLONOSCOPY   "

## 2024-07-29 ENCOUNTER — Telehealth: Payer: Self-pay | Admitting: Emergency Medicine

## 2024-07-29 ENCOUNTER — Encounter: Payer: Self-pay | Admitting: Family Medicine

## 2024-07-29 ENCOUNTER — Telehealth: Admitting: Family Medicine

## 2024-07-29 DIAGNOSIS — E1159 Type 2 diabetes mellitus with other circulatory complications: Secondary | ICD-10-CM

## 2024-07-29 DIAGNOSIS — J069 Acute upper respiratory infection, unspecified: Secondary | ICD-10-CM

## 2024-07-29 MED ORDER — BENZONATATE 100 MG PO CAPS: 100.0000 mg | ORAL_CAPSULE | Freq: Three times a day (TID) | ORAL | 0 refills | Status: AC | PRN

## 2024-07-29 MED ORDER — HYDROCOD POLI-CHLORPHE POLI ER 10-8 MG/5ML PO SUER: 5.0000 mL | Freq: Every evening | ORAL | 0 refills | Status: AC | PRN

## 2024-07-29 NOTE — Telephone Encounter (Signed)
 Patient called and stated she had cough, congestion possible bronchitis for over 1 week. Is there anything she can take over the counter. No virtual or in office appointments available. Please advise

## 2024-07-29 NOTE — Progress Notes (Signed)
 "  Name: Alyssa Lawson   MRN: [id number]    DOB: 07-Mar-1946   Date:07/29/2024       Progress Note  Subjective  Chief Complaint  Chief Complaint  Patient presents with   Nasal Congestion   Cough    Sx since Monday    I connected with  Vana JONETTA Molt  on 07/29/24 at  3:40 PM EST by a video enabled telemedicine application and verified that I am speaking with the correct person using two identifiers.  I discussed the limitations of evaluation and management by telemedicine and the availability of in person appointments. The patient expressed understanding and agreed to proceed with a virtual visit  Staff also discussed with the patient that there may be a patient responsible charge related to this service. Patient Location: at home  Provider Location: [facility] Additional Individuals present: alone   Discussed the use of AI scribe software for clinical note transcription with the patient, who gave verbal consent to proceed.  History of Present Illness Alyssa Lawson is a 79 year old female with type 2 diabetes and hypertension who presents with a cough and upper respiratory symptoms.  She began experiencing symptoms with a scratchy throat and significant nasal congestion, which progressed over the week to include a persistent cough. Initially dry, the cough has since become productive and severe enough to cause rib pain. No shortness of breath is noted.  She experiences chills but no fever or skin rashes. Her appetite remains good, as she was able to eat soup and felt she could eat more.  She reports not feeling any different regarding her diabetes. She has a history of smoking but quit in 1998. She used to experience bronchitis when she smoked, but this is the first time she has had a cough like this in a long time. Her last chest x-ray was in 2018. She is currently taking Mucinex for her symptoms.  She also has hypertension, and her recent blood pressure reading was 110/74, which she  notes is slightly lower than usual. She has been prescribed hydralazine  and clonidine  for blood pressure management.    Patient Active Problem List   Diagnosis Date Noted   Seasonal allergic rhinitis 11/04/2023   SVT (supraventricular tachycardia) 11/04/2023   Primary osteoarthritis of both hands 12/04/2021   Primary osteoarthritis of first carpometacarpal joint of right hand 12/04/2021   Urge incontinence 12/04/2021   Anxiety 12/04/2021   Dyslipidemia associated with type 2 diabetes mellitus (HCC) 12/04/2021   Senile purpura 04/05/2021   DDD (degenerative disc disease), cervical 04/05/2021   Chronic pain of right thumb 04/05/2021   Type 2 diabetes mellitus with complication, with long-term current use of insulin  (HCC) 11/09/2018   Atrial tachycardia 08/25/2017   Abnormal EKG 04/24/2017   Smoking history 04/24/2017   Rotator cuff tendinitis, left 04/10/2017   Cataracts, bilateral 11/25/2016   Impingement syndrome of shoulder, left 10/13/2016   Chronic bilateral low back pain without sciatica 08/20/2016   Mitral regurgitation 06/22/2015   Melasma 12/21/2014   History of hysterectomy 12/21/2014   RLS (restless legs syndrome) 12/21/2014   Benign essential HTN 12/17/2014   Gastric reflux 12/17/2014   History of cervical cancer 12/17/2014   H/O iron deficiency anemia 12/17/2014   TI (tricuspid incompetence) 12/17/2014   Osteopenia of the elderly 12/17/2014    Social History   Tobacco Use   Smoking status: Former    Current packs/day: 0.00    Average packs/day: 0.5 packs/day for 39.4  years (19.7 ttl pk-yrs)    Types: Cigarettes    Start date: 06/23/1956    Quit date: 11/22/1995    Years since quitting: 28.7   Smokeless tobacco: Former    Quit date: 11/22/1996   Tobacco comments:    None  Substance Use Topics   Alcohol use: Yes    Alcohol/week: 1.0 standard drink of alcohol    Types: 1 Glasses of wine per week    Comment: glass of wine occasionally maybe once a month     Current Medications[1]  Allergies[2]  I personally reviewed active problem list, medication list, allergies with the patient/caregiver today.  ROS  Ten systems reviewed and is negative except as mentioned in HPI    Objective  Virtual encounter, vitals not obtained.   Physical Exam  Awake, alert and oriented   Assessment & Plan Acute upper respiratory infection with cough Likely viral etiology with persistent symptoms for a week.  - Prescribed Tussionex  (chlorpheniramine and hydrocodone) at night, one teaspoon, up to twice daily. - Prescribed Tessalon  Perles during the day, one to two tablets, up to three times daily. - Continue Mucinex as needed. - Advised rest and increased fluid intake. - Instructed to send a message on Monday if symptoms do not improve for potential Z-Pak prescription.  Type 2 diabetes mellitus with HTN Diabetes well-managed. Blood pressure slightly low, possibly due to dehydration from illness. - Monitor blood pressure regularly. - Hold clonidine  if blood pressure is low, especially at bedtime. - Ensure adequate hydration to prevent hypotension.       -Red flags and when to present for emergency care or RTC including fever >101.71F, chest pain, shortness of breath, new/worsening/un-resolving symptoms,  reviewed with patient at time of visit. Follow up and care instructions discussed and provided in AVS. - I discussed the assessment and treatment plan with the patient. The patient was provided an opportunity to ask questions and all were answered. The patient agreed with the plan and demonstrated an understanding of the instructions.  I provided 15 minutes of non-face-to-face time during this encounter.  Addley Ballinger F Jylian Pappalardo, MD      [1]  Current Outpatient Medications:    acetaminophen  (TYLENOL ) 500 MG tablet, Take 1 tablet (500 mg total) by mouth every 6 (six) hours as needed for headache., Disp: 30 tablet, Rfl: 0   aspirin  81 MG chewable  tablet, Chew 1 tablet by mouth at bedtime., Disp: , Rfl:    atorvastatin  (LIPITOR) 40 MG tablet, TAKE 1 TABLET BY MOUTH DAILY, Disp: 100 tablet, Rfl: 2   busPIRone  (BUSPAR ) 5 MG tablet, Take 1 tablet (5 mg total) by mouth 3 (three) times daily as needed. TAKE 1 TABLET(5 MG) BY MOUTH TWICE DAILY AS NEEDED, Disp: 270 tablet, Rfl: 1   carvedilol  (COREG ) 25 MG tablet, TAKE 1 TABLET BY MOUTH TWICE  DAILY WITH MEALS, Disp: 200 tablet, Rfl: 2   celecoxib  (CELEBREX ) 100 MG capsule, Take 1 capsule (100 mg total) by mouth 2 (two) times daily. (Patient taking differently: Take 100 mg by mouth daily.), Disp: 180 capsule, Rfl: 1   Cholecalciferol (VITAMIN D) 50 MCG (2000 UT) CAPS, Take 1 capsule by mouth daily., Disp: , Rfl:    cloNIDine  (CATAPRES ) 0.1 MG tablet, TAKE 1 TABLET BY MOUTH TWICE  DAILY, Disp: 200 tablet, Rfl: 2   Cyanocobalamin (VITAMIN B-12) 2000 MCG TBCR, Take 1 tablet by mouth daily., Disp: , Rfl:    ferrous sulfate 325 (65 FE) MG tablet, Take 325 mg  by mouth daily with breakfast., Disp: , Rfl:    hydrALAZINE  (APRESOLINE ) 25 MG tablet, TAKE 1 TABLET BY MOUTH 3 TIMES  DAILY, Disp: 300 tablet, Rfl: 2   isosorbide  mononitrate (IMDUR ) 30 MG 24 hr tablet, TAKE 1 TABLET BY MOUTH DAILY  AFTER LUNCH AT 3 PM, Disp: 100 tablet, Rfl: 2   MAGNESIUM-ZINC PO, Take 1 tablet by mouth daily., Disp: , Rfl:    Multiple Vitamin (MULTIVITAMIN) capsule, Take 1 capsule by mouth daily., Disp: , Rfl:    olmesartan  (BENICAR ) 40 MG tablet, TAKE 1 TABLET BY MOUTH DAILY, Disp: 100 tablet, Rfl: 2   omeprazole  (PRILOSEC) 20 MG capsule, Take 1 capsule (20 mg total) by mouth daily., Disp: 90 capsule, Rfl: 1   oxybutynin  (DITROPAN -XL) 10 MG 24 hr tablet, Take 1 tablet (10 mg total) by mouth at bedtime., Disp: 90 tablet, Rfl: 1   pramipexole  (MIRAPEX ) 0.5 MG tablet, Take 1 tablet (0.5 mg total) by mouth every evening., Disp: 100 tablet, Rfl: 1   vitamin C (ASCORBIC ACID) 500 MG tablet, Take 500 mg by mouth daily., Disp: , Rfl:   [2]  Allergies Allergen Reactions   Amlodipine  Swelling   Meloxicam  Palpitations   "

## 2024-08-08 ENCOUNTER — Ambulatory Visit: Admitting: Urology

## 2024-11-08 ENCOUNTER — Ambulatory Visit: Admitting: Family Medicine

## 2025-07-06 ENCOUNTER — Ambulatory Visit
